# Patient Record
Sex: Female | Born: 1945
Health system: Southern US, Community
[De-identification: ages and names within clinical notes are randomized; demographics above are authoritative.]

## PROBLEM LIST (undated history)

## (undated) DIAGNOSIS — Z8041 Family history of malignant neoplasm of ovary: Secondary | ICD-10-CM

## (undated) DIAGNOSIS — F419 Anxiety disorder, unspecified: Secondary | ICD-10-CM

## (undated) DIAGNOSIS — Z923 Personal history of irradiation: Secondary | ICD-10-CM

## (undated) DIAGNOSIS — I10 Essential (primary) hypertension: Secondary | ICD-10-CM

## (undated) DIAGNOSIS — C801 Malignant (primary) neoplasm, unspecified: Secondary | ICD-10-CM

## (undated) DIAGNOSIS — I4891 Unspecified atrial fibrillation: Secondary | ICD-10-CM

## (undated) DIAGNOSIS — Z800 Family history of malignant neoplasm of digestive organs: Secondary | ICD-10-CM

## (undated) DIAGNOSIS — Z803 Family history of malignant neoplasm of breast: Secondary | ICD-10-CM

## (undated) DIAGNOSIS — E7800 Pure hypercholesterolemia, unspecified: Secondary | ICD-10-CM

## (undated) DIAGNOSIS — K219 Gastro-esophageal reflux disease without esophagitis: Secondary | ICD-10-CM

## (undated) HISTORY — PX: TUBAL LIGATION: SHX77

## (undated) HISTORY — DX: Family history of malignant neoplasm of ovary: Z80.41

## (undated) HISTORY — DX: Family history of malignant neoplasm of breast: Z80.3

## (undated) HISTORY — DX: Family history of malignant neoplasm of digestive organs: Z80.0

## (undated) HISTORY — PX: CARDIAC CATHETERIZATION: SHX172

---

## 2006-12-09 ENCOUNTER — Ambulatory Visit: Payer: Self-pay | Admitting: Cardiology

## 2006-12-25 ENCOUNTER — Ambulatory Visit: Payer: Self-pay | Admitting: Cardiology

## 2006-12-31 ENCOUNTER — Ambulatory Visit: Payer: Self-pay | Admitting: Cardiovascular Disease

## 2006-12-31 ENCOUNTER — Inpatient Hospital Stay (HOSPITAL_BASED_OUTPATIENT_CLINIC_OR_DEPARTMENT_OTHER): Admission: RE | Admit: 2006-12-31 | Discharge: 2006-12-31 | Payer: Self-pay | Admitting: Cardiovascular Disease

## 2010-11-21 NOTE — Assessment & Plan Note (Signed)
[facility]                          [facility] NOTE   NAME:[name], Marissa HALLS           MRN:          [id number]  DATE:12/25/2006                            DOB:          10/12/1945    HISTORY OF PRESENT ILLNESS:  The patient is a 65 year old female  recently admitted to [facility] with elevated blood pressure,  headache, and back discomfort. The patient had an extensive evaluation.  She was found to have a slight inequality in blood pressures between  both arms. She underwent a CT scan of the chest to rule out erratic  dissection which was negative. She was also noted to have no evidence of  pulmonary embolism. The patient reported that the back pain, which was  felt to be rather atypical,   INCOMPLETE     Learta Codding, MD,FACC  Electronically Signed    GED/MedQ  DD: 12/25/2006  DT: 12/26/2006  Job #: [id number]   cc:   Kirstie Peri, MD

## 2010-11-21 NOTE — Cardiovascular Report (Signed)
Marissa [name], Marissa [name]              ACCOUNT NO.:  [id number]   MEDICAL RECORD NO.:  [id number]          PATIENT TYPE:  OIB   LOCATION:  1962                         FACILITY:  [facility]   PHYSICIAN:  Veverly Fells. Excell Seltzer, MD  DATE OF BIRTH:  June 03, 1946   DATE OF PROCEDURE:  12/31/2006  DATE OF DISCHARGE:                            CARDIAC CATHETERIZATION   PROCEDURE:  Left heart catheterization, right heart catheterization,  selective coronary angiography, left ventricular angiography.   INDICATIONS:  [name] is a 65 year old woman who recently presented to  the hospital at [facility] with shortness of breath and back pain.  She  has undergone extensive cardiac evaluation.  She had a stress nuclear  study that did not show definitive ischemia but raised the question of  balanced ischemia with some ventricular dilatation.  She complains of  marked exertional dyspnea and was referred for right and left heart  catheterization in that setting.   Risks and indications of procedure were explained to the patient.  Informed consent was obtained.  Right groin was prepped, draped and  anesthetized with 1% lidocaine.  Using modified Seldinger technique a 4-  French sheath was placed in the right femoral artery and a 7-French  sheath was placed in the right femoral vein.  Using an end-hole  multipurpose catheter, the right heart catheterization was performed.  Oxygen saturations were drawn from the superior vena cava, pulmonary  artery and aorta.  Following the right heart catheterization, selective  coronary angiography was performed using standard 4-French catheters.  An angled pigtail catheter was inserted into the left ventricle and  pressures were recorded.  An RAO left ventriculogram was performed and a  pullback across the aortic valve was done.  All catheter exchanges were  performed over a guidewire.  There were no immediate complications.   FINDINGS:  Right atrial pressures:  A-wave 10,  V-wave 5, mean of 4,  right ventricular pressures 30/5, pulmonary artery pressures 30/8 with a  mean of 18.  Pulmonary capillary wedge pressure:  A-wave 16, V-wave 17,  mean of 11, left ventricular pressure 109/11, aortic pressure is 108/58  with a mean of 80.   Oxygen saturations:  SVC 62, pulmonary artery 67, aorta 94.   Cardiac output by the Fick techniques 4 liters per minute.  Cardiac  index 1.81 liters per minute per meter squared.   CORONARY ANGIOGRAPHY:  Left mainstem is angiographically normal  bifurcates into the LAD and left circumflex.   The LAD is large-caliber vessel that courses down to the LV apex.  There  is minor nonobstructive narrowing at the ostium of the LAD.  No worse  than 20%.  There is a large first diagonal branch that is free of any  significant angiographic disease.  The remaining portions of the  proximal mid and distal LAD are angiographically normal.   The left circumflex is large-caliber vessel.  It courses down and  supplies a very small first OM branch followed by a large second OM  branch.  There are then two medium-sized posterolateral branches.  There  is no significant angiographic disease in the  left circumflex system.   The right coronary artery is a medium size vessel.  It gives off one  small RV marginal branch.  It terminates in a PDA branch, and it is a  dominant right coronary artery.  There is no significant angiographic  disease in the right coronary artery.   Left ventricular function assessed by 30 degrees RAO left  ventriculography shows normal contractility of all segments.  The  estimated LVEF is 60%.  There is no mitral regurgitation.   ASSESSMENT:  1. Nonobstructive coronary artery disease involving the proximal LAD.      There is no angiographic disease in the left circumflex or right      coronary artery.  2. Normal left ventricular function.  3. Normal right heart and left heart hemodynamics.   PLAN:  Recommend  continued medical therapy.  I suspect a noncardiac  etiology of the patient's dyspnea based on her normal filling pressures  and unremarkable coronary arteries.      Veverly Fells. Excell Seltzer, MD  Electronically Signed     MDC/MEDQ  D:  12/31/2006  T:  12/31/2006  Job:  [id number]   cc:   Learta Codding, MD,FACC  Kirstie Peri, MD

## 2010-11-21 NOTE — Assessment & Plan Note (Signed)
[facility]                          [facility] NOTE   NAME:Marissa [name], Marissa [name]           MRN:          [id number]  DATE:12/25/2006                            DOB:          31-May-1946    HISTORY OF PRESENT ILLNESS:  The patient is a 65 year old female who was  recently admitted to [facility] with headache, elevated blood pressure,  and back discomfort. The patient was ruled out for aortic dissection  with a CT scan. She also had a Cardiolite imaging study done which  showed no definite perfusion defects. However, it was increased TID  ratio and mention was made that __________ ischemia could not be ruled  out. The patient states that she continues to have discomfort. She  points towards her upper back which appears to be more related to  vertebral pathology. She also reports a discomfort when swallowing. Her  most concerning complaint, however, is ongoing symptoms of dyspnea. She  states that she becomes dyspneic on minimal exertion. However, she  denies any substernal chest pain. Of note is that the patient is found  to have a carotid bruit. During her last evaluation, carotid Dopplers  were essentially within normal limits. The patient remains very  concerned and states that some of the symptoms are similar to what her  mother had before she presented with a myocardial infarction.   MEDICATIONS:  1. Prilosec OTC.  2. Hydrochlorothiazide 25 mg daily.  3. Aspirin 81 mg daily.  4. Exforge 10/20 mg daily.   PHYSICAL EXAMINATION:  VITAL SIGNS: Blood pressure 132/76, heart rate 77  beats-per-minute, weight 257 pounds.  NECK:  Normal carotid upstrokes with a left sided carotid bruit.  HEART:  Regular rate and rhythm with normal S1, S2, with no murmurs,  rubs, or gallops.  LUNGS:  Clear breath sounds bilaterally.  ABDOMEN:  Soft and nontender with no rebound or guarding, good bowel  sounds.  EXTREMITIES:  No cyanosis, clubbing, or  edema.   PROBLEM LIST:  1. Back pain.  2. Dyspnea.  3. Normal Cardiolite stress study, but with abnormal TID ratio.  4. Throat discomfort.   PLAN:  1. The patient remains concerned about her dyspnea. She does have a      family history of coronary artery disease. During her last visit to      the hospital, we stated that if she had ongoing problems, we would      proceed with a left and right catheterization. The patient is now      willing to proceed with this.  2. If the above study is negative, the patient will need an evaluation      for possible swallowing dysfunction and throat tightness. She      should have a referral to GI.  3. I suspect that the patient's back discomfort is more related to a      spinal pathology and can be further handled by her primary care      physician.     Learta Codding, MD,FACC  Electronically Signed    GED/MedQ  DD: 12/25/2006  DT: 12/26/2006  Job #: [id number]   cc:  Kirstie Peri, MD

## 2010-11-24 NOTE — Assessment & Plan Note (Signed)
[facility]                                 ON-CALL NOTE   Marissa [name], Marissa [name]                       MRN:          [id number]  DATE:09/16/2008                            DOB:          [birth year]    PRIMARY CARDIOLOGIST:  Dr. Vernie Shanks. DeGent.   PRIMARY CARE [place] Baris:  Kirstie Peri, MD   I received a call from [name] this evening, September 16, 2008, reporting  that her blood pressure was high earlier this week and that she saw Dr.  Sherryll Burger who recommended that she increase her atenolol to 50 mg daily.  She  has had some weakness and was seen in the emergency room at [facility] 2  days ago and apparently was told she had reflux.  This evening, she is  due to take her 50 mg of atenolol and her blood pressure is 169/69 and  she is concerned that if the bottom number gets too much lower, she may  pass out.  I reassured her that her pressure if anything is high right  now, that she go ahead and take her atenolol to help bring down her  systolic number.  She was grateful for the reassurance and the call back  and was going to take her atenolol tonight.     Nicolasa Ducking, ANP  Electronically Signed    CB/MedQ  DD: 09/16/2008  DT: 09/17/2008  Job #: [id number]

## 2011-04-25 LAB — POCT I-STAT 3, VENOUS BLOOD GAS (G3P V)
O2 Saturation: 67
Operator id: 194801
pCO2, Ven: 42.3 — ABNORMAL LOW
pH, Ven: 7.364 — ABNORMAL HIGH

## 2011-04-25 LAB — POCT I-STAT 3, ART BLOOD GAS (G3+)
Bicarbonate: 24
O2 Saturation: 94
pO2, Arterial: 70 — ABNORMAL LOW

## 2014-02-15 ENCOUNTER — Observation Stay (HOSPITAL_COMMUNITY)
Admission: EM | Admit: 2014-02-15 | Discharge: 2014-02-18 | Disposition: A | Discharge status: Home / Self Care | Payer: Medicare Other | Attending: Internal Medicine | Admitting: Internal Medicine

## 2014-02-15 ENCOUNTER — Emergency Department (HOSPITAL_COMMUNITY): Payer: Medicare Other

## 2014-02-15 ENCOUNTER — Encounter (HOSPITAL_COMMUNITY): Payer: Self-pay | Admitting: Emergency Medicine

## 2014-02-15 DIAGNOSIS — R072 Precordial pain: Secondary | ICD-10-CM | POA: Diagnosis not present

## 2014-02-15 DIAGNOSIS — Z862 Personal history of diseases of the blood and blood-forming organs and certain disorders involving the immune mechanism: Secondary | ICD-10-CM | POA: Diagnosis not present

## 2014-02-15 DIAGNOSIS — Z79899 Other long term (current) drug therapy: Secondary | ICD-10-CM | POA: Diagnosis not present

## 2014-02-15 DIAGNOSIS — E669 Obesity, unspecified: Secondary | ICD-10-CM | POA: Diagnosis not present

## 2014-02-15 DIAGNOSIS — R079 Chest pain, unspecified: Secondary | ICD-10-CM | POA: Diagnosis present

## 2014-02-15 DIAGNOSIS — I10 Essential (primary) hypertension: Secondary | ICD-10-CM | POA: Diagnosis not present

## 2014-02-15 DIAGNOSIS — R21 Rash and other nonspecific skin eruption: Secondary | ICD-10-CM | POA: Insufficient documentation

## 2014-02-15 DIAGNOSIS — K209 Esophagitis, unspecified without bleeding: Secondary | ICD-10-CM

## 2014-02-15 DIAGNOSIS — Z7982 Long term (current) use of aspirin: Secondary | ICD-10-CM | POA: Diagnosis not present

## 2014-02-15 DIAGNOSIS — Z8639 Personal history of other endocrine, nutritional and metabolic disease: Secondary | ICD-10-CM | POA: Insufficient documentation

## 2014-02-15 HISTORY — DX: Essential (primary) hypertension: I10

## 2014-02-15 HISTORY — DX: Pure hypercholesterolemia, unspecified: E78.00

## 2014-02-15 LAB — CBC WITH DIFFERENTIAL/PLATELET
BASOS ABS: 0 10*3/uL (ref 0.0–0.1)
BASOS PCT: 0 % (ref 0–1)
EOS ABS: 0.1 10*3/uL (ref 0.0–0.7)
EOS PCT: 1 % (ref 0–5)
HCT: 44.8 % (ref 36.0–46.0)
Hemoglobin: 15.5 g/dL — ABNORMAL HIGH (ref 12.0–15.0)
LYMPHS ABS: 2.6 10*3/uL (ref 0.7–4.0)
Lymphocytes Relative: 28 % (ref 12–46)
MCH: 31.3 pg (ref 26.0–34.0)
MCHC: 34.6 g/dL (ref 30.0–36.0)
MCV: 90.3 fL (ref 78.0–100.0)
Monocytes Absolute: 0.6 10*3/uL (ref 0.1–1.0)
Monocytes Relative: 7 % (ref 3–12)
NEUTROS PCT: 64 % (ref 43–77)
Neutro Abs: 5.8 10*3/uL (ref 1.7–7.7)
PLATELETS: 334 10*3/uL (ref 150–400)
RBC: 4.96 MIL/uL (ref 3.87–5.11)
RDW: 12.2 % (ref 11.5–15.5)
WBC: 9.1 10*3/uL (ref 4.0–10.5)

## 2014-02-15 LAB — BASIC METABOLIC PANEL
Anion gap: 16 — ABNORMAL HIGH (ref 5–15)
BUN: 25 mg/dL — ABNORMAL HIGH (ref 6–23)
CALCIUM: 10.5 mg/dL (ref 8.4–10.5)
CO2: 22 mEq/L (ref 19–32)
Chloride: 97 mEq/L (ref 96–112)
Creatinine, Ser: 1.34 mg/dL — ABNORMAL HIGH (ref 0.50–1.10)
GFR, EST AFRICAN AMERICAN: 46 mL/min — AB (ref >90)
GFR, EST NON AFRICAN AMERICAN: 40 mL/min — AB (ref >90)
GLUCOSE: 104 mg/dL — AB (ref 70–99)
Potassium: 3.6 mEq/L — ABNORMAL LOW (ref 3.7–5.3)
SODIUM: 135 meq/L — AB (ref 137–147)

## 2014-02-15 LAB — TROPONIN I: Troponin I: <0.3 ng/mL (ref <0.30)

## 2014-02-15 LAB — D-DIMER, QUANTITATIVE: D-Dimer, Quant: 0.31 ug/mL-FEU (ref 0.00–0.48)

## 2014-02-15 MED ORDER — AMLODIPINE BESYLATE 5 MG PO TABS
10.0000 mg | ORAL_TABLET | Freq: Every day | ORAL | Status: DC
  Administered 2014-02-16 – 2014-02-18 (×3): 10 mg via ORAL
  Filled 2014-02-15 (×3): qty 2

## 2014-02-15 MED ORDER — METOPROLOL SUCCINATE ER 25 MG PO TB24
25.0000 mg | ORAL_TABLET | Freq: Every day | ORAL | Status: DC
  Filled 2014-02-15: qty 1

## 2014-02-15 MED ORDER — LOSARTAN POTASSIUM 50 MG PO TABS
100.0000 mg | ORAL_TABLET | Freq: Every day | ORAL | Status: DC
  Administered 2014-02-16 – 2014-02-18 (×3): 100 mg via ORAL
  Filled 2014-02-15 (×3): qty 2

## 2014-02-15 MED ORDER — HYDROCHLOROTHIAZIDE 25 MG PO TABS
25.0000 mg | ORAL_TABLET | Freq: Every day | ORAL | Status: DC
  Filled 2014-02-15: qty 1

## 2014-02-15 MED ORDER — ASPIRIN EC 81 MG PO TBEC
81.0000 mg | DELAYED_RELEASE_TABLET | Freq: Every day | ORAL | Status: DC
  Administered 2014-02-16 – 2014-02-18 (×3): 81 mg via ORAL
  Filled 2014-02-15 (×3): qty 1

## 2014-02-15 MED ORDER — LOSARTAN POTASSIUM-HCTZ 100-25 MG PO TABS: 1.0000 | ORAL_TABLET | Freq: Every day | ORAL | Status: DC

## 2014-02-15 MED ORDER — SODIUM CHLORIDE 0.9 % IV SOLN
INTRAVENOUS | Status: DC
  Administered 2014-02-15 – 2014-02-18 (×8): via INTRAVENOUS

## 2014-02-15 MED ORDER — NITROGLYCERIN 0.4 MG SL SUBL
0.4000 mg | SUBLINGUAL_TABLET | Freq: Once | SUBLINGUAL | Status: AC
  Administered 2014-02-15: 0.4 mg via SUBLINGUAL
  Filled 2014-02-15: qty 1

## 2014-02-15 MED ORDER — GI COCKTAIL ~~LOC~~
30.0000 mL | Freq: Two times a day (BID) | ORAL | Status: DC | PRN
  Administered 2014-02-15: 30 mL via ORAL
  Filled 2014-02-15: qty 30

## 2014-02-15 MED ORDER — ONDANSETRON HCL 4 MG/2ML IJ SOLN: 4.0000 mg | Freq: Four times a day (QID) | INTRAMUSCULAR | Status: DC | PRN

## 2014-02-15 MED ORDER — ONDANSETRON HCL 4 MG PO TABS: 4.0000 mg | ORAL_TABLET | Freq: Four times a day (QID) | ORAL | Status: DC | PRN

## 2014-02-15 MED ORDER — ASPIRIN 325 MG PO TABS
325.0000 mg | ORAL_TABLET | Freq: Once | ORAL | Status: AC
  Administered 2014-02-15: 325 mg via ORAL
  Filled 2014-02-15: qty 1

## 2014-02-15 MED ORDER — HEPARIN SODIUM (PORCINE) 5000 UNIT/ML IJ SOLN
5000.0000 [IU] | Freq: Three times a day (TID) | INTRAMUSCULAR | Status: DC
  Administered 2014-02-15 – 2014-02-18 (×8): 5000 [IU] via SUBCUTANEOUS
  Filled 2014-02-15 (×8): qty 1

## 2014-02-15 MED ORDER — SODIUM CHLORIDE 0.9 % IJ SOLN
3.0000 mL | Freq: Two times a day (BID) | INTRAMUSCULAR | Status: DC
  Administered 2014-02-16 – 2014-02-17 (×2): 3 mL via INTRAVENOUS

## 2014-02-15 MED ORDER — SULFAMETHOXAZOLE-TMP DS 800-160 MG PO TABS: 1.0000 | ORAL_TABLET | Freq: Two times a day (BID) | ORAL | Status: DC

## 2014-02-15 NOTE — H&P (Signed)
[facility] History and Physical  Marissa [name] OBS:[id number] DOB: 08-Jun-1946 DOA: 02/15/2014  Referring physician: ER PCP: No primary provider on file.   Chief Complaint: Chest pain  HPI: Marissa [name] is a 68 y.o. female  This is a 68 year old lady who gives a almost 24 hour history of almost continuous chest pain that is from the lower end of the anterior chest to the neck. She says it feels like she has a dull pain and heaviness in her chest. It is not associated with nausea, excessive sweating but was associated with dyspnea. She does have a history of cardiac catheterization several years ago which showed essentially normal coronary arteries. She is now being there for further investigation.   Review of Systems:  Constitutional:  No weight loss, night sweats, Fevers, chills, fatigue.  HEENT:  No headaches, Difficulty swallowing,Tooth/dental problems,Sore throat,  No sneezing, itching, ear ache, nasal congestion, post nasal drip,   GI:  No heartburn, indigestion, abdominal pain, nausea, vomiting, diarrhea, change in bowel habits, loss of appetite  Resp:  No shortness of breath with exertion or at rest. No excess mucus, no productive cough, No non-productive cough, No coughing up of blood.No change in color of mucus.No wheezing.No chest wall deformity  Skin:  no rash or lesions.  GU:  no dysuria, change in color of urine, no urgency or frequency. No flank pain.  Musculoskeletal:  No joint pain or swelling. No decreased range of motion. No back pain.  Psych:  No change in mood or affect. No depression or anxiety. No memory loss.   Past Medical History  Diagnosis Date  . Hypertension   . High cholesterol    Past Surgical History  Procedure Laterality Date  . Tubal ligation     Social History:  reports that she has never smoked. She does not have any smokeless tobacco history on file. She reports that she does not drink alcohol or use illicit  drugs.  Allergies  Allergen Reactions  . Nitrostat [Nitroglycerin] Shortness Of Breath    History reviewed. No pertinent family history.   Prior to Admission medications   Medication Sig Start Date End Date Taking? Authorizing Provider  amLODipine (NORVASC) 10 MG tablet Take 10 mg by mouth daily.   Yes Historical Provider, MD  aspirin EC 81 MG tablet Take 81 mg by mouth daily.   Yes Historical Provider, MD  losartan-hydrochlorothiazide (HYZAAR) 100-25 MG per tablet Take 1 tablet by mouth daily.   Yes Historical Provider, MD  metoprolol succinate (TOPROL-XL) 25 MG 24 hr tablet Take 25 mg by mouth daily.   Yes Historical Provider, MD  sulfamethoxazole-trimethoprim (BACTRIM DS) 800-160 MG per tablet Take 1 tablet by mouth 2 (two) times daily. Starting 02/08/2014 x 10 days.   Yes Historical Provider, MD   Physical Exam: Filed Vitals:   02/15/14 1454 02/15/14 1502 02/15/14 1556 02/15/14 1643  BP: 129/56 98/49 114/59 139/49  Pulse: 84 113 68 63  Temp:    98.2 F (36.8 C)  TempSrc:    Oral  Resp: 17 37 16   Height:    5\' 4"  (1.626 m)  Weight:    102.3 kg (225 lb 8.5 oz)  SpO2: 100% 99% 100% 100%    Wt Readings from Last 3 Encounters:  02/15/14 102.3 kg (225 lb 8.5 oz)    General:  Appears calm and comfortable Eyes: PERRL, normal lids, irises & conjunctiva ENT: grossly normal hearing, lips & tongue Neck: no LAD, masses or thyromegaly Cardiovascular:  RRR, no m/r/g. No LE edema. Telemetry: SR, no arrhythmias  Respiratory: CTA bilaterally, no w/r/r. Normal respiratory effort. Abdomen: soft, ntnd Skin: no rash or induration seen on limited exam Musculoskeletal: grossly normal tone BUE/BLE. She appears to have tenderness in the anterior chest wall more on the left than the right, reproducing her pain. Psychiatric: grossly normal mood and affect, speech fluent and appropriate Neurologic: grossly non-focal.          Labs on Admission:  Basic Metabolic Panel:  Recent Labs Lab  02/15/14 1351  NA 135*  K 3.6*  CL 97  CO2 22  GLUCOSE 104*  BUN 25*  CREATININE 1.34*  CALCIUM 10.5       CBC:  Recent Labs Lab 02/15/14 1351  WBC 9.1  NEUTROABS 5.8  HGB 15.5*  HCT 44.8  MCV 90.3  PLT 334   Cardiac Enzymes:  Recent Labs Lab 02/15/14 1351  TROPONINI <0.30    BNP (last 3 results) No results found for this basename: PROBNP,  in the last 8760 hours CBG: No results found for this basename: GLUCAP,  in the last 168 hours  Radiological Exams on Admission: Dg Chest Portable 1 View  02/15/2014   CLINICAL DATA:  Chest pain.  EXAM: PORTABLE CHEST - 1 VIEW  COMPARISON:  09/14/2008  FINDINGS: Heart size and pulmonary vascularity are normal and the lungs are clear. No effusions. No osseous abnormality.  IMPRESSION: Normal chest.   Electronically Signed   By: Rozetta Nunnery M.D.   On: 02/15/2014 14:03    EKG: Independently reviewed. Normal sinus rhythm without any acute ST-T wave changes.  Assessment/Plan   1. Chest pain, atypical, possibly gastrointestinal in origin. Does have risk factors for coronary artery disease. 2. Hypertension. 3. Renal insufficiency, possibly related to dehydration. 4. Morbid obesity.  Plan: 1. Admit to telemetry floor. 2. Serial cardiac enzymes. 3. Appreciate cardiology consultation. 4.  Gastroenterology consultation. Will try GI cocktail to see if this will help. 5. Intravenous fluids for rehydration.  Other recommendations will depend on patient's hospital progress.   Code Status: Full code.   DVT Prophylaxis: Heparin.  Family Communication: I discussed the plan with patient at the bedside.   Disposition Plan: Home when medically stable.  Time spent: 60 minutes.  Doree Albee [facility] Pager [phone].  **Disclaimer: This note may have been dictated with voice recognition software. Similar sounding words can inadvertently be transcribed and this note may contain transcription errors which may not  have been corrected upon publication of note.**

## 2014-02-15 NOTE — ED Notes (Signed)
After Nitro given pt then voices that " I feel like Im having trouble breathing." resp rate in 30's, O2 placed on 2L. sats 99%. Dr. Lacinda Axon at [facility] to assess pt.

## 2014-02-15 NOTE — Consult Note (Signed)
Consulting cardiologist: Dr Carlyle Dolly MD  Clinical Summary Marissa [name] is a 68 y.o.female seen today in ER for chest pain.   From available notes seen in 2008 by Dr Dannielle Burn for somewhat atypical chest pain, primarily upper back pain as well as some dysphagia. She had some progressive dyspnea around that time. Had cardiolite at that time with no perfusion defects by elevated TID ratio. Given progressing symptoms and evidence of TID was referred for left and right cath in 2008.  LHC LM normal, LAD 20%, LCX normal, RCA patent. LVEF 60% by LV gram RHC RA 4, PA 30/8 mean 18, PWCP 11.  Overall patent coronaries with normal filling pressures.    Presents to day with chest pain. Symptoms started last night around 7pm while at rest. 9/10 pressing feeling from upper epigastic area running up her mid chest into her throat. No SOB, no palpitations. No positional. She has also had constant belching since onset of the pain. Drank a coke, took tums without relief. Took NG in ER with hypotensive response, did not affect her pain.   ER vitals p 141/77 p 96 100% RA Trop neg x1, K 3.6, Cr 1.34, BUN 25, GFR 40, D-dimer pending. CXR no acute pathology EKG sinus rhythm, occasional PVCs, no ischemic changes.     No Known Allergies  Medications Scheduled Medications:    Infusions:    PRN Medications:        Past Medical History  Diagnosis Date  . Hypertension   . High cholesterol     Past Surgical History  Procedure Laterality Date  . Tubal ligation      History reviewed. No pertinent family history.  Social History Ms. Turley reports that she has never smoked. She does not have any smokeless tobacco history on file. Ms. Thorman reports that she does not drink alcohol.  Review of Systems CONSTITUTIONAL: No weight loss, fever, chills, weakness or fatigue.  HEENT: Eyes: No visual loss, blurred vision, double vision or yellow sclerae. No hearing loss, sneezing, congestion, runny  nose or sore throat.  SKIN: No rash or itching.  CARDIOVASCULAR: per HPI RESPIRATORY: No shortness of breath, cough or sputum.  GASTROINTESTINAL: No anorexia, nausea, vomiting or diarrhea. No abdominal pain or blood.  GENITOURINARY: no polyuria, no dysuria NEUROLOGICAL: No headache, dizziness, syncope, paralysis, ataxia, numbness or tingling in the extremities. No change in bowel or bladder control.  MUSCULOSKELETAL: No muscle, back pain, joint pain or stiffness.  HEMATOLOGIC: No anemia, bleeding or bruising.  LYMPHATICS: No enlarged nodes. No history of splenectomy.  PSYCHIATRIC: No history of depression or anxiety.      Physical Examination Blood pressure 98/49, pulse 113, temperature 98 F (36.7 C), temperature source Oral, resp. rate 37, height 5\' 4"  (1.626 m), weight 230 lb (104.327 kg), SpO2 99.00%. No intake or output data in the 24 hours ending 02/15/14 1517  HEENT: sclera clear  Cardiovascular: RRR, no m/r/g, no JVD, no carotid bruits  Respiratory: CTAB  GI: abdomen soft, NT, ND  MSK: chest wall very tender to palpation  Neuro: A&O x3 no focal deficits  Psych: appropriate affect   Lab Results  Basic Metabolic Panel:  Recent Labs Lab 02/15/14 1351  NA 135*  K 3.6*  CL 97  CO2 22  GLUCOSE 104*  BUN 25*  CREATININE 1.34*  CALCIUM 10.5    Liver Function Tests: No results found for this basename: AST, ALT, ALKPHOS, BILITOT, PROT, ALBUMIN,  in the last 168 hours  CBC:  Recent Labs Lab 02/15/14 1351  WBC 9.1  NEUTROABS 5.8  HGB 15.5*  HCT 44.8  MCV 90.3  PLT 334    Cardiac Enzymes:  Recent Labs Lab 02/15/14 1351  TROPONINI <0.30    BNP: No components found with this basename: POCBNP,    ECG   Imaging 12/2006 Cath FINDINGS: Right atrial pressures: A-wave 10, V-wave 5, mean of 4,  right ventricular pressures 30/5, pulmonary artery pressures 30/8 with a  mean of 18. Pulmonary capillary wedge pressure: A-wave 16, V-wave 17,  mean of  11, left ventricular pressure 109/11, aortic pressure is 108/58  with a mean of 80.  Oxygen saturations: SVC 62, pulmonary artery 67, aorta 94.  Cardiac output by the Fick techniques 4 liters per minute. Cardiac  index 1.81 liters per minute per meter squared.  CORONARY ANGIOGRAPHY: Left mainstem is angiographically normal  bifurcates into the LAD and left circumflex.  The LAD is large-caliber vessel that courses down to the LV apex. There  is minor nonobstructive narrowing at the ostium of the LAD. No worse  than 20%. There is a large first diagonal Parvin Stetzer that is free of any  significant angiographic disease. The remaining portions of the  proximal mid and distal LAD are angiographically normal.  The left circumflex is large-caliber vessel. It courses down and  supplies a very small first OM Ziair Penson followed by a large second OM  Dominie Benedick. There are then two medium-sized posterolateral branches. There  is no significant angiographic disease in the left circumflex system.  The right coronary artery is a medium size vessel. It gives off one  small RV marginal Sofia Jaquith. It terminates in a PDA Aramis Zobel, and it is a  dominant right coronary artery. There is no significant angiographic  disease in the right coronary artery.  Left ventricular function assessed by 30 degrees RAO left  ventriculography shows normal contractility of all segments. The  estimated LVEF is 60%. There is no mitral regurgitation.  ASSESSMENT:  1. Nonobstructive coronary artery disease involving the proximal LAD.  There is no angiographic disease in the left circumflex or right  coronary artery.  2. Normal left ventricular function.  3. Normal right heart and left heart hemodynamics.  PLAN: Recommend continued medical therapy. I suspect a noncardiac  etiology of the patient's dyspnea based on her normal filling pressures  and unremarkable coronary arteries.   Impression/Recommendations  1. Chest pain - atypical for  cardiac chest pain given continous non-stop pain since 7pm last night (21 hrs). Its also associated with persistent belching, and runs from upper epigastrium to her throat. Her chest wall is very tender to palpation as well - EKG without ischemic changes, she does have occasional PVCs. Troponin negative, would think if ongoing intermittent ischemia this long trop would also be positive.  - admit to cycle cardiac enzyme and EKGs, plan for echo in AM. No plan at this time for stress testing - consider GI evaluation - pain control per medicine team, perhaps consider GI cocktail, PPI or H2 blocker. Avoid prn NG due to low bp response, I suspect she may be dry based on her labs causing the bp drop with NG. Chest pain with NG induced hypotension always brings to mind possible RV infarct, but her EKG and clinical presentation does not suggest this. Her negative D-dimer and lack of tachycardia or hypoxia also do not support PE, which can also cause NG induced hypotension.      Carlyle Dolly, M.D., F.A.C.C.

## 2014-02-15 NOTE — ED Provider Notes (Signed)
CSN: [id number]     Arrival date & time 02/15/14  1328 History  This chart was scribed for Nat Christen, MD by Ludger Nutting, ED Scribe. This patient was seen in room APA12/APA12 and the patient's care was started 2:20 PM.    Chief Complaint  Patient presents with  . Chest Pain    The history is provided by the patient. No language interpreter was used.    HPI Comments: Marissa [name] is a 68 y.o. female with past medical history of HTN, high cholesterol who presents to the Emergency Department complaining of constant, substernal chest pain that began at 7 PM last night. She states the pain is non-radiating and describes it as pressure. She reports associated SOB with activity. She drank a coke and took tums without relief. Patient states she had a cardiac catheterization 8 years ago at [facility] which showed a 25% blockage. She denies diaphoresis, nausea, leg swelling. She takes daily ASA. She is a nonsmoker and denies alcohol use. Mother had history of cardiac disease and cardiac stents in her 52's.   Patient reports having 2-3 episodes of a rash over the last 3 weeks. Patient states she diagnosed with a "vascular infection" to the bilateral legs the first time and was prescribed steroids and antibiotics. She states the second time she had a rash to the bilateral shoulders and chest for which she was prescribed steroids and antibiotics with relief. She reports the most recent episode was a few days ago and she finished a course of prednisone yesterday.   PCP [facility]    Past Medical History  Diagnosis Date  . Hypertension   . High cholesterol    Past Surgical History  Procedure Laterality Date  . Tubal ligation     History reviewed. No pertinent family history. History  Substance Use Topics  . Smoking status: Never Smoker   . Smokeless tobacco: Not on file  . Alcohol Use: No   OB History   Grav Para Term Preterm Abortions TAB SAB Ect Mult Living                 Review  of Systems  A complete 10 system review of systems was obtained and all systems are negative except as noted in the HPI and PMH.    Allergies  Review of patient's allergies indicates no known allergies.  Home Medications   Prior to Admission medications   Medication Sig Start Date End Date Taking? Authorizing Provider  amLODipine (NORVASC) 10 MG tablet Take 10 mg by mouth daily.   Yes Historical Provider, MD  aspirin EC 81 MG tablet Take 81 mg by mouth daily.   Yes Historical Provider, MD  losartan-hydrochlorothiazide (HYZAAR) 100-25 MG per tablet Take 1 tablet by mouth daily.   Yes Historical Provider, MD  metoprolol succinate (TOPROL-XL) 25 MG 24 hr tablet Take 25 mg by mouth daily.   Yes Historical Provider, MD  sulfamethoxazole-trimethoprim (BACTRIM DS) 800-160 MG per tablet Take 1 tablet by mouth 2 (two) times daily. Starting 02/08/2014 x 10 days.   Yes Historical Provider, MD   BP 98/49  Pulse 113  Temp(Src) 98 F (36.7 C) (Oral)  Resp 37  Ht 5\' 4"  (1.626 m)  Wt 230 lb (104.327 kg)  BMI 39.46 kg/m2  SpO2 99% Physical Exam  Nursing note and vitals reviewed. Constitutional: She is oriented to person, place, and time. She appears well-developed and well-nourished.  Obese   HENT:  Head: Normocephalic and atraumatic.  Eyes: Conjunctivae and EOM are normal. Pupils are equal, round, and reactive to light.  Neck: Normal range of motion. Neck supple.  Cardiovascular: Normal rate, regular rhythm and normal heart sounds.   Pulmonary/Chest: Effort normal and breath sounds normal.  Abdominal: Soft. Bowel sounds are normal.  Musculoskeletal: Normal range of motion.  Neurological: She is alert and oriented to person, place, and time.  Skin: Skin is warm and dry. Rash noted. Rash is papular. There is erythema.  Diffuse, erythematous, papular, plaque-like rash  Psychiatric: She has a normal mood and affect. Her behavior is normal.    ED Course  Procedures (including critical care  time)  DIAGNOSTIC STUDIES: Oxygen Saturation is 100% on RA, normal by my interpretation.    COORDINATION OF CARE: 2:25 PM Will order CXR, lab work, EKG. Will give ASA and nitro SL tablet. Discussed treatment plan with pt at bedside and pt agreed to plan.   Labs Review Labs Reviewed  CBC WITH DIFFERENTIAL - Abnormal; Notable for the following:    Hemoglobin 15.5 (*)    All other components within normal limits  BASIC METABOLIC PANEL - Abnormal; Notable for the following:    Sodium 135 (*)    Potassium 3.6 (*)    Glucose, Bld 104 (*)    BUN 25 (*)    Creatinine, Ser 1.34 (*)    GFR calc non Af Amer 40 (*)    GFR calc Af Amer 46 (*)    Anion gap 16 (*)    All other components within normal limits  TROPONIN I  D-DIMER, QUANTITATIVE    Imaging Review Dg Chest Portable 1 View  02/15/2014   CLINICAL DATA:  Chest pain.  EXAM: PORTABLE CHEST - 1 VIEW  COMPARISON:  09/14/2008  FINDINGS: Heart size and pulmonary vascularity are normal and the lungs are clear. No effusions. No osseous abnormality.  IMPRESSION: Normal chest.   Electronically Signed   By: Rozetta Nunnery M.D.   On: 02/15/2014 14:03     EKG Interpretation   Date/Time:  Monday February 15 2014 13:43:28 EDT Ventricular Rate:  95 PR Interval:  132 QRS Duration: 98 QT Interval:  356 QTC Calculation: 447 R Axis:   33 Text Interpretation:  Sinus rhythm Multiple ventricular premature  complexes Consider anterior infarct Confirmed by Hani Patnode  MD, Tyjai Charbonnet ([id number])  on 02/15/2014 3:11:12 PM      MDM   Final diagnoses:  Chest pain, unspecified chest pain type    Good history for anginal chest pain. Patient has minimal to moderate risk factor profile.  EKG shows PVCs but no ST segment changes.  Patient did not tolerate nitroglycerin sublingual.  Discussed with [facility] cardiology. Admit to general medicine.  I personally performed the services described in this documentation, which was scribed in my presence. The recorded information  has been reviewed and is accurate.   Nat Christen, MD 02/15/14 [phone]

## 2014-02-15 NOTE — ED Notes (Signed)
Pt with mid CP since last night with belching, mild HA, +SOB, denies N/V

## 2014-02-16 DIAGNOSIS — E669 Obesity, unspecified: Secondary | ICD-10-CM | POA: Diagnosis not present

## 2014-02-16 DIAGNOSIS — K209 Esophagitis, unspecified without bleeding: Secondary | ICD-10-CM

## 2014-02-16 DIAGNOSIS — R079 Chest pain, unspecified: Secondary | ICD-10-CM

## 2014-02-16 DIAGNOSIS — R072 Precordial pain: Secondary | ICD-10-CM | POA: Diagnosis not present

## 2014-02-16 DIAGNOSIS — I10 Essential (primary) hypertension: Secondary | ICD-10-CM | POA: Diagnosis not present

## 2014-02-16 LAB — COMPREHENSIVE METABOLIC PANEL
ALBUMIN: 3.3 g/dL — AB (ref 3.5–5.2)
ALT: 19 U/L (ref 0–35)
AST: 14 U/L (ref 0–37)
Alkaline Phosphatase: 72 U/L (ref 39–117)
Anion gap: 11 (ref 5–15)
BUN: 21 mg/dL (ref 6–23)
CHLORIDE: 103 meq/L (ref 96–112)
CO2: 25 mEq/L (ref 19–32)
Calcium: 9.4 mg/dL (ref 8.4–10.5)
Creatinine, Ser: 1.12 mg/dL — ABNORMAL HIGH (ref 0.50–1.10)
GFR calc Af Amer: 58 mL/min — ABNORMAL LOW (ref >90)
GFR calc non Af Amer: 50 mL/min — ABNORMAL LOW (ref >90)
GLUCOSE: 81 mg/dL (ref 70–99)
POTASSIUM: 4.4 meq/L (ref 3.7–5.3)
SODIUM: 139 meq/L (ref 137–147)
Total Bilirubin: 0.4 mg/dL (ref 0.3–1.2)
Total Protein: 6.2 g/dL (ref 6.0–8.3)

## 2014-02-16 LAB — TROPONIN I
Troponin I: <0.3 ng/mL (ref <0.30)
Troponin I: <0.3 ng/mL (ref <0.30)

## 2014-02-16 LAB — TSH: TSH: 1.11 u[IU]/mL (ref 0.350–4.500)

## 2014-02-16 MED ORDER — METOPROLOL SUCCINATE ER 25 MG PO TB24
12.5000 mg | ORAL_TABLET | Freq: Every day | ORAL | Status: DC
  Administered 2014-02-16 – 2014-02-17 (×2): 12.5 mg via ORAL
  Filled 2014-02-16: qty 1

## 2014-02-16 NOTE — Progress Notes (Signed)
Patient ID: Marissa [name], female   DOB: [birth year], 68 y.o.   MRN: [id number]    Subjective:    Chest pain this morning  Objective:   Temp:  [97.8 F (36.6 C)-98.5 F (36.9 C)] 97.8 F (36.6 C) (08/11 0524) Pulse Rate:  [51-113] 52 (08/11 0723) Resp:  [16-37] 17 (08/11 0723) BP: (98-141)/(48-77) 111/48 mmHg (08/11 0524) SpO2:  [97 %-100 %] 98 % (08/11 0524) Weight:  [225 lb 8.5 oz (102.3 kg)-230 lb (104.327 kg)] 225 lb 8.5 oz (102.3 kg) (08/10 1643) Last BM Date: 02/15/14  Filed Weights   02/15/14 1337 02/15/14 1643  Weight: 230 lb (104.327 kg) 225 lb 8.5 oz (102.3 kg)    Intake/Output Summary (Last 24 hours) at 02/16/14 0846 Last data filed at 02/16/14 2094  Gross per 24 hour  Intake 1760.83 ml  Output    750 ml  Net 1010.83 ml    Telemetry: NSR, sinus tach, sinus brady overnight to low 50s  Exam:  General:NAD  Resp: CTAB  Cardiac: RRR, no m/r/g, no JVD  GI: abdomen soft, NT, ND  MSK: chest wall/epigastrium tender to palpation  Neuro: no focal deficits  Psych: appropriate affect  Lab Results:  Basic Metabolic Panel:  Recent Labs Lab 02/15/14 1351 02/16/14 0556  NA 135* 139  K 3.6* 4.4  CL 97 103  CO2 22 25  GLUCOSE 104* 81  BUN 25* 21  CREATININE 1.34* 1.12*  CALCIUM 10.5 9.4    Liver Function Tests:  Recent Labs Lab 02/16/14 0556  AST 14  ALT 19  ALKPHOS 72  BILITOT 0.4  PROT 6.2  ALBUMIN 3.3*    CBC:  Recent Labs Lab 02/15/14 1351  WBC 9.1  HGB 15.5*  HCT 44.8  MCV 90.3  PLT 334    Cardiac Enzymes:  Recent Labs Lab 02/15/14 1729 02/15/14 2301 02/16/14 0556  TROPONINI <0.30 <0.30 <0.30    BNP: No results found for this basename: PROBNP,  in the last 8760 hours  Coagulation: No results found for this basename: INR,  in the last 168 hours  ECG:   Medications:   Scheduled Medications: . amLODipine  10 mg Oral Daily  . aspirin EC  81 mg Oral Daily  . heparin  5,000 Units Subcutaneous 3 times per day    . losartan  100 mg Oral Daily   And  . hydrochlorothiazide  25 mg Oral Daily  . metoprolol succinate  25 mg Oral Daily  . sodium chloride  3 mL Intravenous Q12H     Infusions: . sodium chloride 125 mL/hr at 02/16/14 0147     PRN Medications:  gi cocktail, ondansetron (ZOFRAN) IV, ondansetron     Assessment/Plan    1. Chest pain  - atypical for cardiac chest pain given continous non-stop pain since 7pm Sunday night (36 hrs). Its also associated with persistent belching, and runs from upper epigastrium to her throat. Her chest wall is very tender to palpation as well  - EKG without ischemic changes, she does have occasional PVCs. Troponin negative x3, would think if ongoing intermittent ischemia this long trop would also be positive.  - echo pending - unlikely cardiac cause based on above, if normal echo likely will not pursue further cardiac testing.   2. Sinus bradycardia - noted overnight to low 50s, likely while sleeping - given some soft blood pressures will decrease to 12.5mg  daily.   3. HTN - some soft bp's this morning - decrease Toprol XL as above,  will hold HCTZ today as she is being rehydrated. Trend down in Cr and BUN with fluid supports she was dry on admission.       Carlyle Dolly, M.D., F.A.C.C.

## 2014-02-16 NOTE — Progress Notes (Signed)
UR Completed.  Keen Ewalt Jane [phone] 02/16/2014  

## 2014-02-16 NOTE — Progress Notes (Addendum)
PROGRESS NOTE  Marissa [name] QIW:[id number] DOB: Nov 16, 1945 DOA: 02/15/2014 PCP: No primary provider on file.  Assessment/Plan: Chest pain -telemetry floor.  -Serial cardiac enzymes.  - Appreciate cardiology consultation.  -suspect noncardiac in origin, Gastroenterology consultation. Will try GI cocktail to see if this will help.   Esophagitis? Has been on 2 types of abx for rashes in the last month as well as steroids  Renal insuff: -Intravenous fluids for rehydration.   Code Status: full Family Communication:  Disposition Plan:    Consultants:    Procedures:      HPI/Subjective: Still with chest pain Poor appetite- not been eating and drinking well Eating makes chest pain worse- feels like items are stuck  Objective: Filed Vitals:   02/16/14 0723  BP:   Pulse: 52  Temp:   Resp: 17    Intake/Output Summary (Last 24 hours) at 02/16/14 0742 Last data filed at 02/16/14 0637  Gross per 24 hour  Intake 1760.83 ml  Output    750 ml  Net 1010.83 ml   Filed Weights   02/15/14 1337 02/15/14 1643  Weight: 104.327 kg (230 lb) 102.3 kg (225 lb 8.5 oz)    Exam:   General:  A+Ox3, NAD  Cardiovascular: rrr, chest wall tenderness with palpation  Respiratory: clear  Abdomen: +BS, soft  Musculoskeletal: no edema   Data Reviewed: Basic Metabolic Panel:  Recent Labs Lab 02/15/14 1351 02/16/14 0556  NA 135* 139  K 3.6* 4.4  CL 97 103  CO2 22 25  GLUCOSE 104* 81  BUN 25* 21  CREATININE 1.34* 1.12*  CALCIUM 10.5 9.4   Liver Function Tests:  Recent Labs Lab 02/16/14 0556  AST 14  ALT 19  ALKPHOS 72  BILITOT 0.4  PROT 6.2  ALBUMIN 3.3*   No results found for this basename: LIPASE, AMYLASE,  in the last 168 hours No results found for this basename: AMMONIA,  in the last 168 hours CBC:  Recent Labs Lab 02/15/14 1351  WBC 9.1  NEUTROABS 5.8  HGB 15.5*  HCT 44.8  MCV 90.3  PLT 334   Cardiac Enzymes:  Recent Labs Lab  02/15/14 1351 02/15/14 1729 02/15/14 2301 02/16/14 0556  TROPONINI <0.30 <0.30 <0.30 <0.30   BNP (last 3 results) No results found for this basename: PROBNP,  in the last 8760 hours CBG: No results found for this basename: GLUCAP,  in the last 168 hours  No results found for this or any previous visit (from the past 240 hour(s)).   Studies: Dg Chest Portable 1 View  02/15/2014   CLINICAL DATA:  Chest pain.  EXAM: PORTABLE CHEST - 1 VIEW  COMPARISON:  09/14/2008  FINDINGS: Heart size and pulmonary vascularity are normal and the lungs are clear. No effusions. No osseous abnormality.  IMPRESSION: Normal chest.   Electronically Signed   By: Rozetta Nunnery M.D.   On: 02/15/2014 14:03    Scheduled Meds: . amLODipine  10 mg Oral Daily  . aspirin EC  81 mg Oral Daily  . heparin  5,000 Units Subcutaneous 3 times per day  . losartan  100 mg Oral Daily   And  . hydrochlorothiazide  25 mg Oral Daily  . metoprolol succinate  25 mg Oral Daily  . sodium chloride  3 mL Intravenous Q12H   Continuous Infusions: . sodium chloride 125 mL/hr at 02/16/14 0147   Antibiotics Given (last 72 hours)   None      Active Problems:   Chest pain  HTN (hypertension)   Morbid obesity    Time spent: 29 min    Cecelia Graciano  [facility] Pager [phone]. If 7PM-7AM, please contact night-coverage at [email or web address], password [facility] 02/16/2014, 7:42 AM  LOS: 1 day

## 2014-02-16 NOTE — Consult Note (Signed)
Reason for Consult: chest pain Referring Physician:  Hospitalist.   Marissa [name] is an 68 y.o. female.  HPI: Admitted thru the ED last night. She c/o chest pressure radiating into her neck. Symptoms started the night before. Symptoms started while sitting on coach.  She actually thought she had indigestion. She tried Tums which did not help. She tossed and turned all night.   She received NTG in the ED and she dropped her pressure. Her chest pain was not relieved.  She received a GI cocktail which helped some. It did stop the burping.  She denies hx of doing anything strenuous over the past few days.  Today she continues to have chest pressure. Troponins are negative.  She tells me she is going to have a cardiac stress test this am. Her appetite has been good. She does tell me when she eats, she has fullness.  She rarely has indigestion. Home medications do not include a PPI.  BMs are normal. No melena or BRRB. No change in her stools.   Past Medical History  Diagnosis Date  . Hypertension   . High cholesterol     Past Surgical History  Procedure Laterality Date  . Tubal ligation    . Cardiac catheterization      2008    History reviewed. No pertinent family history.  Social History:  reports that she has never smoked. She does not have any smokeless tobacco history on file. She reports that she does not drink alcohol or use illicit drugs.  Allergies:  Allergies  Allergen Reactions  . Nitrostat [Nitroglycerin] Shortness Of Breath    Medications: I have reviewed the patient's current medications.  Results for orders placed during the hospital encounter of 02/15/14 (from the past 48 hour(s))  CBC WITH DIFFERENTIAL     Status: Abnormal   Collection Time    02/15/14  1:51 PM      Result Value Ref Range   WBC 9.1  4.0 - 10.5 K/uL   RBC 4.96  3.87 - 5.11 MIL/uL   Hemoglobin 15.5 (*) 12.0 - 15.0 g/dL   HCT 44.8  36.0 - 46.0 %   MCV 90.3  78.0 - 100.0 fL   MCH 31.3  26.0 -  34.0 pg   MCHC 34.6  30.0 - 36.0 g/dL   RDW 12.2  11.5 - 15.5 %   Platelets 334  150 - 400 K/uL   Neutrophils Relative % 64  43 - 77 %   Neutro Abs 5.8  1.7 - 7.7 K/uL   Lymphocytes Relative 28  12 - 46 %   Lymphs Abs 2.6  0.7 - 4.0 K/uL   Monocytes Relative 7  3 - 12 %   Monocytes Absolute 0.6  0.1 - 1.0 K/uL   Eosinophils Relative 1  0 - 5 %   Eosinophils Absolute 0.1  0.0 - 0.7 K/uL   Basophils Relative 0  0 - 1 %   Basophils Absolute 0.0  0.0 - 0.1 K/uL  BASIC METABOLIC PANEL     Status: Abnormal   Collection Time    02/15/14  1:51 PM      Result Value Ref Range   Sodium 135 (*) 137 - 147 mEq/L   Potassium 3.6 (*) 3.7 - 5.3 mEq/L   Chloride 97  96 - 112 mEq/L   CO2 22  19 - 32 mEq/L   Glucose, Bld 104 (*) 70 - 99 mg/dL   BUN 25 (*) 6 - 23  mg/dL   Creatinine, Ser 1.34 (*) 0.50 - 1.10 mg/dL   Calcium 10.5  8.4 - 10.5 mg/dL   GFR calc non Af Amer 40 (*) >90 mL/min   GFR calc Af Amer 46 (*) >90 mL/min   Comment: (NOTE)     The eGFR has been calculated using the CKD EPI equation.     This calculation has not been validated in all clinical situations.     eGFR's persistently <90 mL/min signify possible Chronic Kidney     Disease.   Anion gap 16 (*) 5 - 15  TROPONIN I     Status: None   Collection Time    02/15/14  1:51 PM      Result Value Ref Range   Troponin I <0.30  <0.30 ng/mL   Comment:            Due to the release kinetics of cTnI,     a negative result within the first hours     of the onset of symptoms does not rule out     myocardial infarction with certainty.     If myocardial infarction is still suspected,     repeat the test at appropriate intervals.  TSH     Status: None   Collection Time    02/15/14  1:51 PM      Result Value Ref Range   TSH 1.110  0.350 - 4.500 uIU/mL   Comment: Performed at [facility]  D-DIMER, QUANTITATIVE     Status: None   Collection Time    02/15/14  3:22 PM      Result Value Ref Range   D-Dimer, Quant 0.31  0.00 -  0.48 ug/mL-FEU   Comment:            AT THE INHOUSE ESTABLISHED CUTOFF     VALUE OF 0.48 ug/mL FEU,     THIS ASSAY HAS BEEN DOCUMENTED     IN THE LITERATURE TO HAVE     A SENSITIVITY AND NEGATIVE     PREDICTIVE VALUE OF AT LEAST     98 TO 99%.  THE TEST RESULT     SHOULD BE CORRELATED WITH     AN ASSESSMENT OF THE CLINICAL     PROBABILITY OF DVT / VTE.  TROPONIN I     Status: None   Collection Time    02/15/14  5:29 PM      Result Value Ref Range   Troponin I <0.30  <0.30 ng/mL   Comment:            Due to the release kinetics of cTnI,     a negative result within the first hours     of the onset of symptoms does not rule out     myocardial infarction with certainty.     If myocardial infarction is still suspected,     repeat the test at appropriate intervals.  TROPONIN I     Status: None   Collection Time    02/15/14 11:01 PM      Result Value Ref Range   Troponin I <0.30  <0.30 ng/mL   Comment:            Due to the release kinetics of cTnI,     a negative result within the first hours     of the onset of symptoms does not rule out     myocardial infarction with certainty.     If myocardial infarction  is still suspected,     repeat the test at appropriate intervals.  TROPONIN I     Status: None   Collection Time    02/16/14  5:56 AM      Result Value Ref Range   Troponin I <0.30  <0.30 ng/mL   Comment:            Due to the release kinetics of cTnI,     a negative result within the first hours     of the onset of symptoms does not rule out     myocardial infarction with certainty.     If myocardial infarction is still suspected,     repeat the test at appropriate intervals.  COMPREHENSIVE METABOLIC PANEL     Status: Abnormal   Collection Time    02/16/14  5:56 AM      Result Value Ref Range   Sodium 139  137 - 147 mEq/L   Potassium 4.4  3.7 - 5.3 mEq/L   Comment: DELTA CHECK NOTED   Chloride 103  96 - 112 mEq/L   CO2 25  19 - 32 mEq/L   Glucose, Bld 81  70 -  99 mg/dL   BUN 21  6 - 23 mg/dL   Creatinine, Ser 1.12 (*) 0.50 - 1.10 mg/dL   Calcium 9.4  8.4 - 10.5 mg/dL   Total Protein 6.2  6.0 - 8.3 g/dL   Albumin 3.3 (*) 3.5 - 5.2 g/dL   AST 14  0 - 37 U/L   ALT 19  0 - 35 U/L   Alkaline Phosphatase 72  39 - 117 U/L   Total Bilirubin 0.4  0.3 - 1.2 mg/dL   GFR calc non Af Amer 50 (*) >90 mL/min   GFR calc Af Amer 58 (*) >90 mL/min   Comment: (NOTE)     The eGFR has been calculated using the CKD EPI equation.     This calculation has not been validated in all clinical situations.     eGFR's persistently <90 mL/min signify possible Chronic Kidney     Disease.   Anion gap 11  5 - 15    Dg Chest Portable 1 View  02/15/2014   CLINICAL DATA:  Chest pain.  EXAM: PORTABLE CHEST - 1 VIEW  COMPARISON:  09/14/2008  FINDINGS: Heart size and pulmonary vascularity are normal and the lungs are clear. No effusions. No osseous abnormality.  IMPRESSION: Normal chest.   Electronically Signed   By: Rozetta Nunnery M.D.   On: 02/15/2014 14:03    ROS Blood pressure 111/48, pulse 52, temperature 97.8 F (36.6 C), temperature source Oral, resp. rate 17, height _0  (1.626 m), weight 225 lb 8.5 oz (102.3 kg), SpO2 98.00%. Physical Exam Alert and oriented. Skin warm and dry. Oral mucosa is moist.   . Sclera anicteric, conjunctivae is pink. Thyroid not enlarged. No cervical lymphadenopathy. Lungs clear. Heart regular rate and rhythm. Chest tenderness with gentle pressure.  Abdomen is soft. Bowel sounds are positive. No hepatomegaly. No abdominal masses felt. No tenderness.  No edema to lower extremities.   Assessment/Plan: Chest pain. ? Etiology.  She has chest tenderness on palpation. Troponins have been negative. I will discuss with Dr. Laural Golden. Will wait for cardiac work up to be completed before proceeding.    SETZER,TERRI W 02/16/2014, 8:13 AM     GI attending note; Patient interviewed examined; Patient is 68 year old Caucasian female who presents with  prolonged chest pain and negative troponin  levels. Chest vein appears to be multifactorial. For one she has chest wall tenderness. She does not have typical symptoms of GERD. LFTs are normal. If cardiac evaluation is negative would consider EGD an upper abdominal ultrasound.

## 2014-02-16 NOTE — Care Management Note (Addendum)
    Page 1 of 1   02/18/2014     12:04:02 PM CARE MANAGEMENT NOTE 02/18/2014  Patient:  Marissa [name], Marissa [name]   Account Number:  [id number]  Date Initiated:  02/16/2014  Documentation initiated by:  Jolene Provost  Subjective/Objective Assessment:   Patient admitted with chest pain. Patient from home, lives with husband. Patient is independent with ADL's, has no DME's, and no HH needs.     Action/Plan:   Patient plans to discharge home with self care. Patient has no CM needs at this time.   Anticipated DC Date:  02/17/2014   Anticipated DC Plan:  [place]  CM consult      Choice offered to / List presented to:             Status of service:  Completed, signed off Medicare Important Message given?   (If response is "NO", the following Medicare IM given date fields will be blank) Date Medicare IM given:   Medicare IM given by:   Date Additional Medicare IM given:   Additional Medicare IM given by:    Discharge Disposition:  HOME/SELF CARE  Per UR Regulation:    If discussed at Long Length of Stay Meetings, dates discussed:    Comments:  02/18/2014 [place], RN, MSN, PCCN Patient to be discharged home today with self care. No CM needs identified at this time.   02/16/2014 [place], RN, MSN, [employer]

## 2014-02-17 ENCOUNTER — Observation Stay (HOSPITAL_COMMUNITY): Payer: Medicare Other

## 2014-02-17 DIAGNOSIS — I10 Essential (primary) hypertension: Secondary | ICD-10-CM

## 2014-02-17 DIAGNOSIS — I369 Nonrheumatic tricuspid valve disorder, unspecified: Secondary | ICD-10-CM

## 2014-02-17 DIAGNOSIS — K209 Esophagitis, unspecified without bleeding: Secondary | ICD-10-CM

## 2014-02-17 DIAGNOSIS — R072 Precordial pain: Secondary | ICD-10-CM | POA: Diagnosis not present

## 2014-02-17 MED ORDER — PANTOPRAZOLE SODIUM 40 MG PO TBEC
40.0000 mg | DELAYED_RELEASE_TABLET | Freq: Two times a day (BID) | ORAL | Status: DC
  Administered 2014-02-17 – 2014-02-18 (×3): 40 mg via ORAL
  Filled 2014-02-17 (×3): qty 1

## 2014-02-17 MED ORDER — IOHEXOL 350 MG/ML SOLN
100.0000 mL | Freq: Once | INTRAVENOUS | Status: AC | PRN
  Administered 2014-02-17: 100 mL via INTRAVENOUS

## 2014-02-17 NOTE — Progress Notes (Signed)
Subjective; Patient feels much better today. She's not having any more testing. She said she sat upright after evening meal and did not experience regurgitation. She denies shortness of breath or abdominal pain. She states she's never had abdominal pain. She is waiting for echo to be done today.  Objective; BP 116/54  Pulse 59  Temp(Src) 97.5 F (36.4 C) (Oral)  Resp 20  Ht 5\' 4"  (1.626 m)  Wt 225 lb 8.5 oz (102.3 kg)  BMI 38.69 kg/m2  SpO2 98% Patient is alert and in no acute distress. She has no chest wall tenderness today. Abdomen is also soft and nontender without organomegaly or masses.  Lab data; All for troponin levels have been less than 0.30  Assessment; Atypical chest pain. She is ruled out for MI. Echo is pending. If echo is normal will proceed with EGD. In the meantime will start her on pantoprazole 40 mg by mouth twice a day. Patient had subcutaneous heparin at 6:44 AM; therefore we'll wait for 5 to 6 hours before EGD done. Patient made n.p.o. except for by mouth medications.

## 2014-02-17 NOTE — Progress Notes (Signed)
UR Completed Haunani Dickard Graves-Bigelow, RN,BSN [phone]  

## 2014-02-17 NOTE — Progress Notes (Signed)
PROGRESS NOTE  Marissa [name] JSH:[id number] DOB: 07/18/1945 DOA: 02/15/2014 PCP: No primary provider on file.  Assessment/Plan: Chest pain -telemetry floor, monitor showing no changes  -Serial cardiac enzymes remain negative x3 sets  - Transthoracic echocardiogram showing abnormal calcified structures in the RV apex lead he secondary calcified papillary muscles. Case discussed with cardiology who recommended CT PE to assess for the possibility of embolic event. -Patient likely to undergo EGD today  Esophagitis? Has been on 2 types of abx for rashes in the last month as well as steroids -GI consulted, possible EGD planned for today  Renal insuff: -Intravenous fluids for rehydration.   Code Status: full Family Communication:  Disposition Plan:    Consultants: GI Cardiology  Procedures:      HPI/Subjective: Patient reports having chest pain located in the retrosternal and left-sided regions, somewhat improved today. She denies nausea vomiting fevers or chills  Objective: Filed Vitals:   02/17/14 0900  BP:   Pulse: 63  Temp:   Resp:     Intake/Output Summary (Last 24 hours) at 02/17/14 1245 Last data filed at 02/17/14 0500  Gross per 24 hour  Intake 1391.25 ml  Output   1475 ml  Net -83.75 ml   Filed Weights   02/15/14 1337 02/15/14 1643  Weight: 104.327 kg (230 lb) 102.3 kg (225 lb 8.5 oz)    Exam:   General:  A+Ox3, NAD  Cardiovascular: rrr, chest wall tenderness with palpation  Respiratory: clear  Abdomen: +BS, soft  Musculoskeletal: no edema   Data Reviewed: Basic Metabolic Panel:  Recent Labs Lab 02/15/14 1351 02/16/14 0556  NA 135* 139  K 3.6* 4.4  CL 97 103  CO2 22 25  GLUCOSE 104* 81  BUN 25* 21  CREATININE 1.34* 1.12*  CALCIUM 10.5 9.4   Liver Function Tests:  Recent Labs Lab 02/16/14 0556  AST 14  ALT 19  ALKPHOS 72  BILITOT 0.4  PROT 6.2  ALBUMIN 3.3*   No results found for this basename: LIPASE, AMYLASE,   in the last 168 hours No results found for this basename: AMMONIA,  in the last 168 hours CBC:  Recent Labs Lab 02/15/14 1351  WBC 9.1  NEUTROABS 5.8  HGB 15.5*  HCT 44.8  MCV 90.3  PLT 334   Cardiac Enzymes:  Recent Labs Lab 02/15/14 1351 02/15/14 1729 02/15/14 2301 02/16/14 0556  TROPONINI <0.30 <0.30 <0.30 <0.30   BNP (last 3 results) No results found for this basename: PROBNP,  in the last 8760 hours CBG: No results found for this basename: GLUCAP,  in the last 168 hours  No results found for this or any previous visit (from the past 240 hour(s)).   Studies: Dg Chest Portable 1 View  02/15/2014   CLINICAL DATA:  Chest pain.  EXAM: PORTABLE CHEST - 1 VIEW  COMPARISON:  09/14/2008  FINDINGS: Heart size and pulmonary vascularity are normal and the lungs are clear. No effusions. No osseous abnormality.  IMPRESSION: Normal chest.   Electronically Signed   By: Rozetta Nunnery M.D.   On: 02/15/2014 14:03    Scheduled Meds: . amLODipine  10 mg Oral Daily  . aspirin EC  81 mg Oral Daily  . heparin  5,000 Units Subcutaneous 3 times per day  . losartan  100 mg Oral Daily  . pantoprazole  40 mg Oral BID AC  . sodium chloride  3 mL Intravenous Q12H   Continuous Infusions: . sodium chloride 125 mL/hr at 02/17/14 [id number]  Antibiotics Given (last 72 hours)   None      Active Problems:   Chest pain   HTN (hypertension)   Morbid obesity    Time spent: 25 min    Kelvin Cellar  [facility] Pager [phone]. If 7PM-7AM, please contact night-coverage at [email or web address], password [facility] 02/17/2014, 12:45 PM  LOS: 2 days

## 2014-02-17 NOTE — Progress Notes (Signed)
Echo and CTA chest results noted. Will postpone esophagogastroduodenoscopy until cardiac evaluation completed. EGD can be performed on an outpatient basis. Continue pantoprazole at 40 mg by mouth twice a day.

## 2014-02-17 NOTE — Progress Notes (Signed)
Subjective:  Chest pain resolved  Objective:  Vital Signs in the last 24 hours: Temp:  [97.5 F (36.4 C)-98.7 F (37.1 C)] 97.5 F (36.4 C) (08/12 0621) Pulse Rate:  [58-63] 59 (08/12 0621) Resp:  [20] 20 (08/12 0621) BP: (107-148)/(39-74) 116/54 mmHg (08/12 0621) SpO2:  [97 %-100 %] 98 % (08/12 0621)  Intake/Output from previous day: 08/11 0701 - 08/12 0700 In: 1954.2 [P.O.:600; I.V.:1354.2] Out: 2863 [Urine:1825] Intake/Output from this shift:    Physical Exam: NECK: Without JVD, HJR, or bruit LUNGS: Clear anterior, posterior, lateral HEART: Regular rate and rhythm, no murmur, gallop, rub, bruit, thrill, or heave EXTREMITIES: Without cyanosis, clubbing, or edema   Lab Results:  Recent Labs  02/15/14 1351  WBC 9.1  HGB 15.5*  PLT 334    Recent Labs  02/15/14 1351 02/16/14 0556  NA 135* 139  K 3.6* 4.4  CL 97 103  CO2 22 25  GLUCOSE 104* 81  BUN 25* 21  CREATININE 1.34* 1.12*    Recent Labs  02/15/14 2301 02/16/14 0556  TROPONINI <0.30 <0.30   Hepatic Function Panel  Recent Labs  02/16/14 0556  PROT 6.2  ALBUMIN 3.3*  AST 14  ALT 19  ALKPHOS 72  BILITOT 0.4   No results found for this basename: CHOL,  in the last 72 hours No results found for this basename: PROTIME,  in the last 72 hours  Imaging:   Cardiac Studies:  Assessment/Plan:  1. Chest pain   - atypical for cardiac chest pain given continous non-stop pain since 7pm Sunday night. Its also associated with persistent belching, and runs from upper epigastrium to her throat. Her chest wall is very tender to palpation as well   - EKG without ischemic changes, she does have occasional PVCs. Troponin negative x3, would think if ongoing intermittent ischemia this long trop would also be positive.   - echo pending - unlikely cardiac cause based on above, if normal echo likely will not pursue further cardiac testing.   2. HTN -stable on lower dose Toprol.  -continue to hold HCTZ today  as she is being rehydrated. Trend down in Cr and BUN with fluid supports she was dry on admission.        LOS: 2 days    Ermalinda Barrios 02/17/2014, 8:45 AM  Patient seen and discussed with PA Lenze. Atypical chest pain in description and duration, she describes constant non-relenting pain since Sunday night and just resolved Tuesday night. No evidence of ACS. She describes after eating dinner sour burning taste/feeling in her throat and mouth last night, resolved with sitting up.  We are awaiting echo results, if normal will not pursue any further cardiac testing. Will stop her beta blocker due to sinus bradycardia.    Zandra Abts MD

## 2014-02-17 NOTE — Progress Notes (Signed)
Echo completed, normal LV systolic function with no wall motion abnormalities. Given normal echo and very atypical chest pain will not pursue ischemic testing. Her echo showed abnormal calcified structures in the RV apex, most likely calcified papillary muscles. Cannot rule out possible prominent calcified trabeculations or calcified thrombus. There remains a low suspicion for PE given her clinical presentation and negative D-dimer however with this echo finding and currently unexplained chest pain the possibility does remain. Recommend CT PE to rule out embolic event as cause of her chest pain. She will need an outpatient cardiac MRI to further evaluate her RV.   Zandra Abts MD

## 2014-02-17 NOTE — Progress Notes (Signed)
*  PRELIMINARY RESULTS* Echocardiogram 2D Echocardiogram has been performed.  Leavy Cella 02/17/2014, 9:12 AM

## 2014-02-18 ENCOUNTER — Other Ambulatory Visit: Payer: Self-pay

## 2014-02-18 DIAGNOSIS — R072 Precordial pain: Secondary | ICD-10-CM | POA: Diagnosis not present

## 2014-02-18 DIAGNOSIS — R229 Localized swelling, mass and lump, unspecified: Principal | ICD-10-CM

## 2014-02-18 DIAGNOSIS — R079 Chest pain, unspecified: Secondary | ICD-10-CM

## 2014-02-18 DIAGNOSIS — IMO0002 Reserved for concepts with insufficient information to code with codable children: Secondary | ICD-10-CM

## 2014-02-18 LAB — CBC
HCT: 41.8 % (ref 36.0–46.0)
HEMOGLOBIN: 14.1 g/dL (ref 12.0–15.0)
MCH: 31.1 pg (ref 26.0–34.0)
MCHC: 33.7 g/dL (ref 30.0–36.0)
MCV: 92.3 fL (ref 78.0–100.0)
PLATELETS: 223 10*3/uL (ref 150–400)
RBC: 4.53 MIL/uL (ref 3.87–5.11)
RDW: 12.2 % (ref 11.5–15.5)
WBC: 6.6 10*3/uL (ref 4.0–10.5)

## 2014-02-18 MED ORDER — PANTOPRAZOLE SODIUM 40 MG PO TBEC: 40.0000 mg | DELAYED_RELEASE_TABLET | Freq: Two times a day (BID) | ORAL | Status: DC

## 2014-02-18 NOTE — Progress Notes (Signed)
Very atypical chest pain, no clear cause from cardiac standpoint. Do not see indication for stress testing at this time. She will need an outpatient cardiac MRI for abnormality noted in her RV (calcified papillary muscle, vs trabeculation, vs calcified thrombus). I feel most likely it is a calcified papillary muscle but MRI will help confirm. No further cardiac testing at this time. She may follow up with Jory Sims in 2-3 weeks, will talk with our staff about scheduling cardiac MRI. Will signoff from inpatient standpoint.    Zandra Abts MD

## 2014-02-18 NOTE — Progress Notes (Signed)
D/c instructions reviewed with patient.  Verbalized understanding.  Pt dc'd to home with husband. Schonewitz, Eulis Canner 02/18/2014

## 2014-02-18 NOTE — Discharge Summary (Signed)
Physician Discharge Summary  Marissa [name] ZTI:[id number] DOB: [birth year] DOA: 02/15/2014  PCP: No primary provider on file.  Admit date: 02/15/2014 Discharge date: 02/18/2014  Time spent: 35 minutes  Recommendations for Outpatient Follow-up:  1. Please followup on patient's chest pain symptoms, she underwent workup for her chest pain during this hospitalization, felt unlikely cardiac related.   Discharge Diagnoses:  Active Problems:   Chest pain   HTN (hypertension)   Morbid obesity   Discharge Condition: Stable/improved  Diet recommendation: Heart healthy diet  Filed Weights   02/15/14 1337 02/15/14 1643  Weight: 104.327 kg (230 lb) 102.3 kg (225 lb 8.5 oz)    History of present illness:  Marissa [name] is a 68 y.o. female  This is a 68 year old lady who gives a almost 24 hour history of almost continuous chest pain that is from the lower end of the anterior chest to the neck. She says it feels like she has a dull pain and heaviness in her chest. It is not associated with nausea, excessive sweating but was associated with dyspnea. She does have a history of cardiac catheterization several years ago which showed essentially normal coronary arteries. She is now being there for further investigation.   Hospital Course:  Patient is a pleasant 68 year old female with a past medical history of hypertension, admitted to medicine service on 02/15/2014 when she presented with complaints of chest pain. Her chest pain had atypical features. She was admitted to telemetry where troponins were cycled and remained negative. She was seen and evaluated by cardiology. A transthoracic echocardiogram performed on 02/17/2014 showing ejection fraction of 55-60%, indeterminate diastolic function. Wall motion was normal, no regional wall motion abnormalities noted. A CT scan with IV contrast of lungs was negative for pulmonary embolism. Gastroenterology was consulted as well. She was started on PPI  therapy with Protonix 40 mg twice a day. Patient likely to undergo further workup with EGD as an outpatient. Causes of chest pain could be musculoskeletal in origin as she had pain to palpation over precordial region. This could also be related to gastroesophageal reflux disease. By 02/18/2014 she reported feeling well enough to go home, was tolerating by mouth intake, discharged in stable condition.  Procedures:  Transthoracic echocardiogram performed on 02/17/2014 showing ejection fraction of 55-60%.  Consultations:  Cardiology  Gastroenterology  Discharge Exam: Filed Vitals:   02/18/14 0551  BP: 143/64  Pulse: 78  Temp: 98.2 F (36.8 C)  Resp: 17    General: Patient is in no acute distress she is awake alert and oriented, states feeling better, tolerating by mouth intake Cardiovascular: Regular rate and rhythm normal S1-S2 no murmurs rubs or gallops Respiratory: Normal respiratory effort, clear to auscultation bilaterally Abdomen: Soft nontender nondistended  Discharge Instructions You were cared for by a hospitalist during your hospital stay. If you have any questions about your discharge medications or the care you received while you were in the hospital after you are discharged, you can call the unit and asked to speak with the hospitalist on call if the hospitalist that took care of you is not available. Once you are discharged, your primary care physician will handle any further medical issues. Please note that NO REFILLS for any discharge medications will be authorized once you are discharged, as it is imperative that you return to your primary care physician (or establish a relationship with a primary care physician if you do not have one) for your aftercare needs so that they can reassess your  need for medications and monitor your lab values.  Discharge Instructions   Call MD for:  difficulty breathing, headache or visual disturbances    Complete by:  As directed      Call  MD for:  extreme fatigue    Complete by:  As directed      Call MD for:  persistant dizziness or light-headedness    Complete by:  As directed      Call MD for:  persistant nausea and vomiting    Complete by:  As directed      Call MD for:  severe uncontrolled pain    Complete by:  As directed      Call MD for:  temperature >100.4    Complete by:  As directed      Diet - low sodium heart healthy    Complete by:  As directed      Discharge instructions    Complete by:  As directed   Please follow up with your Family Physician in 1-2 weeks     Increase activity slowly    Complete by:  As directed             Medication List    STOP taking these medications       sulfamethoxazole-trimethoprim 800-160 MG per tablet  Commonly known as:  BACTRIM DS      TAKE these medications       amLODipine 10 MG tablet  Commonly known as:  NORVASC  Take 10 mg by mouth daily.     aspirin EC 81 MG tablet  Take 81 mg by mouth daily.     losartan-hydrochlorothiazide 100-25 MG per tablet  Commonly known as:  HYZAAR  Take 1 tablet by mouth daily.     metoprolol succinate 25 MG 24 hr tablet  Commonly known as:  TOPROL-XL  Take 25 mg by mouth daily.     pantoprazole 40 MG tablet  Commonly known as:  PROTONIX  Take 1 tablet (40 mg total) by mouth 2 (two) times daily before a meal.       Allergies  Allergen Reactions  . Nitrostat [Nitroglycerin] Shortness Of Breath       Follow-up Information   Follow up with REHMAN,NAJEEB U, MD In 2 weeks.   Specialty:  Gastroenterology   Contact information:   [address], [address] [place] [address] [phone]        The results of significant diagnostics from this hospitalization (including imaging, microbiology, ancillary and laboratory) are listed below for reference.    Significant Diagnostic Studies: Ct Angio Chest Pe W/cm &/or Wo Cm  02/17/2014   CLINICAL DATA:  Anterior chest pain and shortness of breath.  EXAM: CT  ANGIOGRAPHY CHEST WITH CONTRAST  TECHNIQUE: Multidetector CT imaging of the chest was performed using the standard protocol during bolus administration of intravenous contrast. Multiplanar CT image reconstructions and MIPs were obtained to evaluate the vascular anatomy.  CONTRAST:  148mL OMNIPAQUE IOHEXOL 350 MG/ML SOLN  COMPARISON:  12/10/2006  FINDINGS: Negative for a pulmonary embolism. There is a small hiatal hernia. There is no significant mediastinal, hilar or axillary lymphadenopathy. Images of the upper abdomen are unremarkable. There is no significant pericardial or pleural fluid.  The trachea and mainstem bronchi are patent. Few densities in the superior segment of the right lower lobe are most compatible with atelectasis. There is no significant airspace disease or consolidation in the lungs. No acute bone abnormality.  Review of the  MIP images confirms the above findings.  IMPRESSION: Negative for a pulmonary embolism.  No acute chest abnormality. Small amount of atelectasis in the right lower lobe.   Electronically Signed   By: Markus Daft M.D.   On: 02/17/2014 14:29   Dg Chest Portable 1 View  02/15/2014   CLINICAL DATA:  Chest pain.  EXAM: PORTABLE CHEST - 1 VIEW  COMPARISON:  09/14/2008  FINDINGS: Heart size and pulmonary vascularity are normal and the lungs are clear. No effusions. No osseous abnormality.  IMPRESSION: Normal chest.   Electronically Signed   By: Rozetta Nunnery M.D.   On: 02/15/2014 14:03    Microbiology: No results found for this or any previous visit (from the past 240 hour(s)).   Labs: Basic Metabolic Panel:  Recent Labs Lab 02/15/14 1351 02/16/14 0556  NA 135* 139  K 3.6* 4.4  CL 97 103  CO2 22 25  GLUCOSE 104* 81  BUN 25* 21  CREATININE 1.34* 1.12*  CALCIUM 10.5 9.4   Liver Function Tests:  Recent Labs Lab 02/16/14 0556  AST 14  ALT 19  ALKPHOS 72  BILITOT 0.4  PROT 6.2  ALBUMIN 3.3*   No results found for this basename: LIPASE, AMYLASE,  in the  last 168 hours No results found for this basename: AMMONIA,  in the last 168 hours CBC:  Recent Labs Lab 02/15/14 1351 02/18/14 0618  WBC 9.1 6.6  NEUTROABS 5.8  --   HGB 15.5* 14.1  HCT 44.8 41.8  MCV 90.3 92.3  PLT 334 223   Cardiac Enzymes:  Recent Labs Lab 02/15/14 1351 02/15/14 1729 02/15/14 2301 02/16/14 0556  TROPONINI <0.30 <0.30 <0.30 <0.30   BNP: BNP (last 3 results) No results found for this basename: PROBNP,  in the last 8760 hours CBG: No results found for this basename: GLUCAP,  in the last 168 hours     Signed:  Kelvin Cellar  [facility] 02/18/2014, 11:37 AM

## 2014-02-22 ENCOUNTER — Telehealth (INDEPENDENT_AMBULATORY_CARE_PROVIDER_SITE_OTHER): Payer: Self-pay | Admitting: *Deleted

## 2014-02-22 ENCOUNTER — Other Ambulatory Visit: Payer: Self-pay

## 2014-02-22 ENCOUNTER — Telehealth: Payer: Self-pay

## 2014-02-22 DIAGNOSIS — I10 Essential (primary) hypertension: Secondary | ICD-10-CM

## 2014-02-22 NOTE — Telephone Encounter (Signed)
Per Dr.Branch order placed for Cardiac MRI,Terry Jennette Kettle at [facility] office to schedule

## 2014-02-22 NOTE — Telephone Encounter (Signed)
Per Dr.Rehman after Marissa [name] has her cardiac work up ,Dr.Rehman states that he will do EGD. Forwarded to Terri as FYI as the patient has an appointment with her later this month.

## 2014-02-22 NOTE — Telephone Encounter (Signed)
Message copied by Bernita Raisin on Mon Feb 22, 2014  9:10 AM ------      Message from: [place] F      Created: Thu Feb 18, 2014  8:38 AM       Patient needs cardiac MRI arranged for RV mass, and also follow up with [facility] in 2 weeks. Currently she is inpatient.                  Zandra Abts MD ------

## 2014-02-23 ENCOUNTER — Encounter: Payer: Self-pay | Admitting: Cardiology

## 2014-03-04 ENCOUNTER — Ambulatory Visit (INDEPENDENT_AMBULATORY_CARE_PROVIDER_SITE_OTHER): Payer: Medicare Other | Admitting: Internal Medicine

## 2014-03-04 ENCOUNTER — Encounter (INDEPENDENT_AMBULATORY_CARE_PROVIDER_SITE_OTHER): Payer: Self-pay | Admitting: Internal Medicine

## 2014-03-04 VITALS — BP 150/68 | HR 60 | Temp 97.6°F | Ht 65.0 in | Wt 227.7 lb

## 2014-03-04 DIAGNOSIS — R0789 Other chest pain: Secondary | ICD-10-CM

## 2014-03-04 NOTE — Progress Notes (Signed)
Subjective:    Patient ID: Marissa [name], female    DOB: 1946-01-16, 68 y.o.   MRN: [id number]  HPI Recently admitted to [facility] on 02/17/2014 with chest pain radiating into her neck. He symptoms started the night before admission.  She actually thought she had indigestion. She tried Tums which did not help.  Occurs maybe 1-2 times a day.  Pain usually last a few seconds.  While in the ED before admission she received a GI cocktail which helped some.  While in the hospital, her troponin's were negative.  She did not have a stress test while in the hospital. She tells me today she has a sensation of fullness. . She still has chest pain. When she has the pain, she will change position and the pain will resolve. The pain is located in her upper chest. The pain is random. She cannot tell me if the pain is related to eating.  Appetite is good. She can eat anything she wants. She occasionally has acid reflux, and will take a Tum. There is no abdominal pain. She usually has a BM 1-2 a day. No melena or BRRB.     Scheduled MR Card Morphology w/wo 03/09/2014.    02/17/2014 CT angio chest with/without CM: Negative for a pulmonary embolism.  No acute chest abnormality. Small amount of atelectasis in the right  lower lobe.   Echo cardiogram 02/17/2014 EF: 60  CBC    Component Value Date/Time   WBC 6.6 02/18/2014 0618   RBC 4.53 02/18/2014 0618   HGB 14.1 02/18/2014 0618   HCT 41.8 02/18/2014 0618   PLT 223 02/18/2014 0618   MCV 92.3 02/18/2014 0618   MCH 31.1 02/18/2014 0618   MCHC 33.7 02/18/2014 0618   RDW 12.2 02/18/2014 0618   LYMPHSABS 2.6 02/15/2014 1351   MONOABS 0.6 02/15/2014 1351   EOSABS 0.1 02/15/2014 1351   BASOSABS 0.0 02/15/2014 1351   CMP     Component Value Date/Time   NA 139 02/16/2014 0556   K 4.4 02/16/2014 0556   CL 103 02/16/2014 0556   CO2 25 02/16/2014 0556   GLUCOSE 81 02/16/2014 0556   BUN 21 02/16/2014 0556   CREATININE 1.12* 02/16/2014 0556   CALCIUM 9.4 02/16/2014 0556   PROT 6.2 02/16/2014 0556   ALBUMIN 3.3* 02/16/2014 0556   AST 14 02/16/2014 0556   ALT 19 02/16/2014 0556   ALKPHOS 72 02/16/2014 0556   BILITOT 0.4 02/16/2014 0556   GFRNONAA 50* 02/16/2014 0556   GFRAA 58* 02/16/2014 0556       Review of Systems Past Medical History  Diagnosis Date  . Hypertension   . High cholesterol     Past Surgical History  Procedure Laterality Date  . Tubal ligation    . Cardiac catheterization      2008    Allergies  Allergen Reactions  . Nitrostat [Nitroglycerin] Shortness Of Breath    Current Outpatient Prescriptions on File Prior to Visit  Medication Sig Dispense Refill  . amLODipine (NORVASC) 10 MG tablet Take 10 mg by mouth daily.      Marland Kitchen aspirin EC 81 MG tablet Take 81 mg by mouth daily.      Marland Kitchen losartan-hydrochlorothiazide (HYZAAR) 100-25 MG per tablet Take 1 tablet by mouth daily.      . metoprolol succinate (TOPROL-XL) 25 MG 24 hr tablet Take 25 mg by mouth daily.       No current facility-administered medications on file prior to visit.  Objective:   Physical Exam  Filed Vitals:   03/04/14 1032  BP: 150/68  Pulse: 60  Temp: 97.6 F (36.4 C)  Height: 5\' 5"  (1.651 m)  Weight: 227 lb 11.2 oz (103.284 kg)  Alert and oriented. Skin warm and dry. Oral mucosa is moist.   . Sclera anicteric, conjunctivae is pink. Thyroid not enlarged. No cervical lymphadenopathy. Lungs clear. Heart regular rate and rhythm.  Abdomen is soft. Bowel sounds are positive. No hepatomegaly. No abdominal masses felt. No tenderness.  No edema to lower extremities.      Assessment & Plan:  Chest pain: MR card. 1st of September. She has not been released from Cardiology. She has appt with Cardiology on the 9th of this month

## 2014-03-04 NOTE — Patient Instructions (Addendum)
Will await clearance from cardiology and then will proceed with an EGD. Will bring possible a release on the 9th of September.

## 2014-03-09 ENCOUNTER — Ambulatory Visit (HOSPITAL_COMMUNITY)
Admission: RE | Admit: 2014-03-09 | Discharge: 2014-03-09 | Disposition: A | Discharge status: Home / Self Care | Payer: Medicare Other | Source: Ambulatory Visit | Attending: Cardiology | Admitting: Cardiology

## 2014-03-09 DIAGNOSIS — I517 Cardiomegaly: Secondary | ICD-10-CM | POA: Insufficient documentation

## 2014-03-09 DIAGNOSIS — R229 Localized swelling, mass and lump, unspecified: Secondary | ICD-10-CM

## 2014-03-09 DIAGNOSIS — IMO0002 Reserved for concepts with insufficient information to code with codable children: Secondary | ICD-10-CM

## 2014-03-09 MED ORDER — GADOBENATE DIMEGLUMINE 529 MG/ML IV SOLN
33.0000 mL | Freq: Once | INTRAVENOUS | Status: AC | PRN
  Administered 2014-03-09: 33 mL via INTRAVENOUS

## 2014-03-10 ENCOUNTER — Telehealth: Payer: Self-pay | Admitting: *Deleted

## 2014-03-10 NOTE — Telephone Encounter (Signed)
Notified pt of results confirmed appt for 9/9 with Estella Husk, PA

## 2014-03-17 ENCOUNTER — Encounter: Payer: Self-pay | Admitting: Physician Assistant

## 2014-03-17 ENCOUNTER — Ambulatory Visit (INDEPENDENT_AMBULATORY_CARE_PROVIDER_SITE_OTHER): Payer: Medicare Other | Admitting: Physician Assistant

## 2014-03-17 VITALS — BP 142/92 | HR 64 | Ht 65.0 in | Wt 229.0 lb

## 2014-03-17 DIAGNOSIS — R079 Chest pain, unspecified: Secondary | ICD-10-CM

## 2014-03-17 DIAGNOSIS — I10 Essential (primary) hypertension: Secondary | ICD-10-CM

## 2014-03-17 NOTE — Progress Notes (Signed)
HPI: This is a 68 year old female patient who was admitted to the hospital with atypical chest pain. Troponins were negative and 2-D echo 02/17/14 EF 55-60%, and indeterminate diastolic function. CT scan negative for pulmonary embolus and GI placed her on a PPI.  Chest pain could also be musculoskeletal as she had pain to palpation over the precordial region. Dr. Harl Bowie did not feel there was an indication for stress testing. She needed a cardiac MRI for abnormality noted in her RV (calcified papillary muscle versus trabeculation versus calcified thrombus). He felt it was most likely a calcified papillary muscle but MRI will help confirm.  Dr. Harl Bowie reviewed her MRI and feels the abnormal area we saw on echo is only calcified normal heart muscle and there is no concerning finding in that area.  The patient feels much better since she's been on a PPI. She is going to have an endoscopy. She no longer has any pain to palpation over her chest area. She hasn't had any further chest pain and denies dyspnea dyspnea on exertion palpitations or presyncope. Her blood pressure has been up since insurance stopped paying for one of her medications and Dr. Brigitte Pulse is adjusting her other BP meds.  Allergies  Allergen Reactions  . Nitrostat [Nitroglycerin] Shortness Of Breath     Current Outpatient Prescriptions  Medication Sig Dispense Refill  . amLODipine (NORVASC) 10 MG tablet Take 10 mg by mouth daily.      Marland Kitchen aspirin EC 81 MG tablet Take 81 mg by mouth daily.      Marland Kitchen losartan-hydrochlorothiazide (HYZAAR) 100-25 MG per tablet Take 1 tablet by mouth daily.      . metoprolol succinate (TOPROL-XL) 25 MG 24 hr tablet Take 25 mg by mouth daily.      Marland Kitchen omeprazole (PRILOSEC OTC) 20 MG tablet Take 20 mg by mouth daily.       No current facility-administered medications for this visit.    Past Medical History  Diagnosis Date  . Hypertension   . High cholesterol     Past Surgical History  Procedure  Laterality Date  . Tubal ligation    . Cardiac catheterization      2008    No family history on file.  History   Social History  . Marital Status: Married    Spouse Name: N/A    Number of Children: N/A  . Years of Education: N/A   Occupational History  . Not on file.   Social History Main Topics  . Smoking status: Never Smoker   . Smokeless tobacco: Never Used  . Alcohol Use: No  . Drug Use: No  . Sexual Activity: Not on file   Other Topics Concern  . Not on file   Social History Narrative  . No narrative on file    ROS: See history of present illness otherwise negative  BP 142/92  Pulse 64  Ht 5\' 5"  (1.651 m)  Wt 229 lb (103.874 kg)  BMI 38.11 kg/m2  SpO2 99%  PHYSICAL EXAM: Obese, in no acute distress. Neck: No JVD, HJR, Bruit, or thyroid enlargement  Lungs: No tachypnea, clear without wheezing, rales, or rhonchi  Cardiovascular: RRR, PMI not displaced, heart sounds normal, no murmurs, gallops, bruit, thrill, or heave.  Abdomen: BS normal. Soft without organomegaly, masses, lesions or tenderness.  Extremities: without cyanosis, clubbing or edema. Good distal pulses bilateral  SKin: Warm, no lesions or rashes   Musculoskeletal: No deformities  Neuro: no focal signs  Wt Readings from Last 3 Encounters:  03/17/14 229 lb (103.874 kg)  03/04/14 227 lb 11.2 oz (103.284 kg)  02/15/14 225 lb 8.5 oz (102.3 kg)   EKG normal sinus rhythm, nonspecific ST-T wave changes, no acute change  Cardiac MRI 03/09/14 IMPRESSION: 1) No RV mass review of echo suggests calcification of the apical RV trabeculations No evidence of tumor or thrombus   2) Normal LV size and function EF 60%   3) Moderate LAE   4) Normal MV, TV and AV   5) Prominent epicardial fat with no effusion   6) No delayed gadolinium uptake in LV myocardium   Jenkins Rouge     Electronically Signed   By: Jenkins Rouge M.D.   On: 03/09/2014 15:09   2-D echo 02/17/14 Study  Conclusions  - Left ventricle: The cavity size was normal. Wall thickness was   increased in a pattern of mild LVH. Systolic function was normal.   The estimated ejection fraction was in the range of 55% to 60%.   Wall motion was normal; there were no regional wall motion   abnormalities. - Aortic valve: Mildly calcified annulus. Trileaflet; mildly   thickened leaflets. Valve area (VTI): 2.25 cm^2. Valve area   (Vmax): 2.16 cm^2. - Mitral valve: Mildly calcified annulus. Mildly thickened leaflets   . - Left atrium: The atrium was moderately dilated. - Right ventricle: The cavity size was mildly to moderately   dilated. There is a 25 x 9 mm calcified structure adherent to the   RV free wall near the apex. There is a similar structure slightly   more distal toward the apex measuring 7 x 7 mm. Differential   includes prominent trabeculation, calcified papillary muscles, or   calcified thrombus. TEE would likely not provide sufficient   visualization, recommend cardiac MRI to further evaluate. - Right atrium: The atrium was mildly dilated

## 2014-03-17 NOTE — Assessment & Plan Note (Signed)
Weight loss recommended 

## 2014-03-17 NOTE — Assessment & Plan Note (Signed)
BP has been up. Recommend 2 g sodium diet and followup with Dr. Brigitte Pulse.

## 2014-03-17 NOTE — Assessment & Plan Note (Signed)
Chest pain resolved with PPI. MRI showed no RV mass probable calcification of the apical RV trabeculations. No evidence of tumor or thrombus. Normal LV size and function EF 60%, moderate left atrial enlargement. Followup when necessary.

## 2014-03-17 NOTE — Patient Instructions (Signed)
Your physician recommends that you schedule a follow-up appointment in: only as needed      Please follow 2 Gm sodium diet provided for you     Thank you for choosing [facility] !

## 2014-03-30 ENCOUNTER — Telehealth (INDEPENDENT_AMBULATORY_CARE_PROVIDER_SITE_OTHER): Payer: Self-pay | Admitting: *Deleted

## 2014-03-30 NOTE — Telephone Encounter (Signed)
From Cardiology: it looks like she had been cleared.

## 2014-03-30 NOTE — Telephone Encounter (Signed)
You saw patient on 03/04/14 and requested cardiac clearance for scheduling EGD, she had appt with cardiologist 9/9, patient wants to know of she is cleared to have EGD -- please advise

## 2014-03-31 ENCOUNTER — Other Ambulatory Visit (INDEPENDENT_AMBULATORY_CARE_PROVIDER_SITE_OTHER): Payer: Self-pay | Admitting: *Deleted

## 2014-03-31 ENCOUNTER — Encounter (INDEPENDENT_AMBULATORY_CARE_PROVIDER_SITE_OTHER): Payer: Self-pay | Admitting: *Deleted

## 2014-03-31 DIAGNOSIS — R079 Chest pain, unspecified: Secondary | ICD-10-CM

## 2014-03-31 NOTE — Telephone Encounter (Signed)
Chest pain

## 2014-03-31 NOTE — Telephone Encounter (Signed)
EGD sch'd 05/05/14, patient aware

## 2014-03-31 NOTE — Telephone Encounter (Signed)
What is diagnosis for EGD?

## 2014-05-05 ENCOUNTER — Ambulatory Visit (HOSPITAL_COMMUNITY)
Admission: RE | Admit: 2014-05-05 | Discharge: 2014-05-05 | Disposition: A | Discharge status: Home Health | Payer: Medicare Other | Source: Ambulatory Visit | Attending: Internal Medicine | Admitting: Internal Medicine

## 2014-05-05 ENCOUNTER — Encounter (HOSPITAL_COMMUNITY)
Admission: RE | Disposition: A | Discharge status: Home Health | Payer: Self-pay | Source: Ambulatory Visit | Attending: Internal Medicine

## 2014-05-05 ENCOUNTER — Encounter (HOSPITAL_COMMUNITY): Payer: Self-pay | Admitting: *Deleted

## 2014-05-05 DIAGNOSIS — K449 Diaphragmatic hernia without obstruction or gangrene: Secondary | ICD-10-CM | POA: Insufficient documentation

## 2014-05-05 DIAGNOSIS — I10 Essential (primary) hypertension: Secondary | ICD-10-CM | POA: Insufficient documentation

## 2014-05-05 DIAGNOSIS — Z7982 Long term (current) use of aspirin: Secondary | ICD-10-CM | POA: Diagnosis not present

## 2014-05-05 DIAGNOSIS — Z79899 Other long term (current) drug therapy: Secondary | ICD-10-CM | POA: Diagnosis not present

## 2014-05-05 DIAGNOSIS — K219 Gastro-esophageal reflux disease without esophagitis: Secondary | ICD-10-CM | POA: Diagnosis not present

## 2014-05-05 DIAGNOSIS — R079 Chest pain, unspecified: Secondary | ICD-10-CM | POA: Diagnosis present

## 2014-05-05 DIAGNOSIS — K2950 Unspecified chronic gastritis without bleeding: Secondary | ICD-10-CM | POA: Insufficient documentation

## 2014-05-05 DIAGNOSIS — I252 Old myocardial infarction: Secondary | ICD-10-CM | POA: Diagnosis not present

## 2014-05-05 DIAGNOSIS — Z888 Allergy status to other drugs, medicaments and biological substances status: Secondary | ICD-10-CM | POA: Insufficient documentation

## 2014-05-05 DIAGNOSIS — K3189 Other diseases of stomach and duodenum: Secondary | ICD-10-CM

## 2014-05-05 DIAGNOSIS — R0789 Other chest pain: Secondary | ICD-10-CM

## 2014-05-05 HISTORY — PX: ESOPHAGOGASTRODUODENOSCOPY: SHX5428

## 2014-05-05 HISTORY — DX: Gastro-esophageal reflux disease without esophagitis: K21.9

## 2014-05-05 SURGERY — EGD (ESOPHAGOGASTRODUODENOSCOPY)
Anesthesia: Moderate Sedation

## 2014-05-05 MED ORDER — MIDAZOLAM HCL 5 MG/5ML IJ SOLN
INTRAMUSCULAR | Status: AC
  Filled 2014-05-05: qty 10

## 2014-05-05 MED ORDER — SODIUM CHLORIDE 0.9 % IV SOLN
INTRAVENOUS | Status: DC
  Administered 2014-05-05: 1000 mL via INTRAVENOUS

## 2014-05-05 MED ORDER — BUTAMBEN-TETRACAINE-BENZOCAINE 2-2-14 % EX AERO
INHALATION_SPRAY | CUTANEOUS | Status: DC | PRN
  Administered 2014-05-05: 1 via TOPICAL
  Administered 2014-05-05: 2 via TOPICAL

## 2014-05-05 MED ORDER — MEPERIDINE HCL 50 MG/ML IJ SOLN
INTRAMUSCULAR | Status: DC | PRN
  Administered 2014-05-05 (×2): 25 mg via INTRAVENOUS

## 2014-05-05 MED ORDER — MEPERIDINE HCL 50 MG/ML IJ SOLN
INTRAMUSCULAR | Status: AC
  Filled 2014-05-05: qty 1

## 2014-05-05 MED ORDER — STERILE WATER FOR IRRIGATION IR SOLN
Status: DC | PRN
  Administered 2014-05-05: 15:00:00

## 2014-05-05 MED ORDER — MIDAZOLAM HCL 5 MG/5ML IJ SOLN
INTRAMUSCULAR | Status: DC | PRN
  Administered 2014-05-05 (×4): 2 mg via INTRAVENOUS

## 2014-05-05 NOTE — Op Note (Signed)
EGD PROCEDURE REPORT  PATIENT:  Marissa [name]  MR#:  [id number] Birthdate:  July 24, 1945, 68 y.o., female Endoscopist:  Dr. Rogene Houston, MD Referred By:  Dr. Rayne Du ref. provider found Procedure Date: 05/05/2014  Procedure:   EGD  Indications:  Patient is 68 year old female with non-cardiac chest pain.            Informed Consent:  The risks, benefits, alternatives & imponderables which include, but are not limited to, bleeding, infection, perforation, drug reaction and potential missed lesion have been reviewed.  The potential for biopsy, lesion removal, esophageal dilation, etc. have also been discussed.  Questions have been answered.  All parties agreeable.  Please see history & physical in medical record for more information.  Medications:  Demerol 50 mg IV  Versed 6 mg IV Cetacaine spray topically for oropharyngeal anesthesia  Description of procedure:  The endoscope was introduced through the mouth and advanced to the second portion of the duodenum without difficulty or limitations. The mucosal surfaces were surveyed very carefully during advancement of the scope and upon withdrawal.  Findings:  Esophagus: Mucosa of the esophagus was normal. GE junction was wavy otherwise unremarkable. GEJ:  34 cm Hiatus:  36 cm Stomach:  Stomach was emptied and distended very well with insufflation. Folds in the proximal stomach were normal. Examination mucosa gastric body was normal. There was fine nodularity to antral mucosa with  few prepyloric erosions. A causal lesion noted at antrum along the posterior wall about 15 mm in maximal diameter. Pyloric channel was patent. Angularis fundus and cardia was unremarkable. Duodenum:  Normal bulbar and post bulbar mucosa.  Therapeutic/Diagnostic Maneuvers Performed:  Antral biopsy taken for routine histology.  Complications:  None  Impression: Small sliding hiatal hernia without changes of reflux esophagitis. Erosive antral gastritis. Antral  biopsy taken. Submucosal lesion at antrum about 15 mm in maximal diameter. It will need to be further evaluated.  Recommendations:  Continue anti-reflux measures and omeprazole as before. Await results of biopsy. Patient will need endoscopic ultrasound to further evaluate antral submucosal lesion.   Vashawn Ekstein U  05/05/2014  3:33 PM  CC: Dr. Monico Blitz, MD & Dr. Rayne Du ref. provider found

## 2014-05-05 NOTE — H&P (Addendum)
Marissa [name] is an 68 y.o. female.   Chief Complaint: Patient is here for EGD. HPI: Patient is 68 year old Caucasian female was admitted to this facility with chest pain back in August 2015 for myocardial infarction. She was begun on PPI. She is feeling much better. She still has intermittent pain. She denies nausea vomiting dysphagia or heartburn. She also denies epigastric pain or melena. She is undergoing diagnostic EGD.  Past Medical History  Diagnosis Date  . Hypertension   . High cholesterol   . GERD (gastroesophageal reflux disease)     Past Surgical History  Procedure Laterality Date  . Tubal ligation    . Cardiac catheterization      2008    History reviewed. No pertinent family history. Social History:  reports that she has never smoked. She has never used smokeless tobacco. She reports that she does not drink alcohol or use illicit drugs.  Allergies:  Allergies  Allergen Reactions  . Nitrostat [Nitroglycerin] Shortness Of Breath    Medications Prior to Admission  Medication Sig Dispense Refill  . amLODipine (NORVASC) 10 MG tablet Take 10 mg by mouth daily.      Marland Kitchen losartan-hydrochlorothiazide (HYZAAR) 100-25 MG per tablet Take 1 tablet by mouth daily.      . metoprolol succinate (TOPROL-XL) 25 MG 24 hr tablet Take 25 mg by mouth daily.      Marland Kitchen omeprazole (PRILOSEC OTC) 20 MG tablet Take 20 mg by mouth daily.      Marland Kitchen aspirin EC 81 MG tablet Take 81 mg by mouth daily.        No results found for this or any previous visit (from the past 48 hour(s)). No results found.  ROS  Blood pressure 134/70, pulse 66, temperature 97.8 F (36.6 C), temperature source Oral, resp. rate 11, height 5\' 5"  (1.651 m), weight 230 lb (104.327 kg), SpO2 97.00%. Physical Exam  Constitutional: She appears well-developed and well-nourished.  HENT:  Mouth/Throat: Oropharynx is clear and moist.  Eyes: Conjunctivae are normal. No scleral icterus.  Neck: No thyromegaly present.   Cardiovascular: Normal rate, regular rhythm and normal heart sounds.   No murmur heard. Respiratory: Effort normal and breath sounds normal.  Mild midsternal tenderness  GI: Soft. She exhibits no distension and no mass. There is no tenderness.  Musculoskeletal: She exhibits no edema.  Lymphadenopathy:    She has no cervical adenopathy.  Neurological: She is alert.  Skin: Skin is warm.     Assessment/Plan Noncardiac chest pain. Diagnostic EGD.  Marissa [name] 05/05/2014, 3:06 PM

## 2014-05-05 NOTE — Discharge Instructions (Addendum)
Resume usual medications and diet. No driving for 24 hours. Gastrointestinal Endoscopy, Care After Refer to this sheet in the next few weeks. These instructions provide you with information on caring for yourself after your procedure. Your caregiver may also give you more specific instructions. Your treatment has been planned according to current medical practices, but problems sometimes occur. Call your caregiver if you have any problems or questions after your procedure. HOME CARE INSTRUCTIONS  If you were given medicine to help you relax (sedative), do not drive, operate machinery, or sign important documents for 24 hours.  Avoid alcohol and hot or warm beverages for the first 24 hours after the procedure.  Only take over-the-counter or prescription medicines for pain, discomfort, or fever as directed by your caregiver. You may resume taking your normal medicines unless your caregiver tells you otherwise. Ask your caregiver when you may resume taking medicines that may cause bleeding, such as aspirin, clopidogrel, or warfarin.  You may return to your normal diet and activities on the day after your procedure, or as directed by your caregiver. Walking may help to reduce any bloated feeling in your abdomen.  Drink enough fluids to keep your urine clear or pale yellow.  You may gargle with salt water if you have a sore throat. SEEK IMMEDIATE MEDICAL CARE IF:  You have severe nausea or vomiting.  You have severe abdominal pain, abdominal cramps that last longer than 6 hours, or abdominal swelling (distention).  You have severe shoulder or back pain.  You have trouble swallowing.  You have shortness of breath, your breathing is shallow, or you are breathing faster than normal.  You have a fever or a rapid heartbeat.  You vomit blood or material that looks like coffee grounds.  You have bloody, black, or tarry stools. MAKE SURE YOU:  Understand these instructions.  Will watch your  condition.  Will get help right away if you are not doing well or get worse.

## 2014-05-07 ENCOUNTER — Encounter (HOSPITAL_COMMUNITY): Payer: Self-pay | Admitting: Internal Medicine

## 2014-05-10 ENCOUNTER — Telehealth: Payer: Self-pay

## 2014-05-10 DIAGNOSIS — K3189 Other diseases of stomach and duodenum: Secondary | ICD-10-CM

## 2014-05-10 NOTE — Telephone Encounter (Signed)
-----   Message from Milus Banister, MD sent at 05/10/2014 10:31 AM EST ----- Regarding: RE: EUS she needs upper EUS, radial +/- linear, +MAC, for stomach mass (submucosal lesion), noted by Dr. Laural Golden. Next avail EUS Thursday (not during hosp week).  Thanks  ----- Message -----    From: Barron Alvine, CMA    Sent: 05/10/2014   9:19 AM      To: Milus Banister, MD Subject: Melton Alar: EUS                                          ----- Message -----    From: Worthy Keeler    Sent: 05/10/2014   9:11 AM      To: Barron Alvine, CMA Subject: EUS                                            Patient needs EUS for submucosal lesion at antrum about 15 mm in maximal diameter -- all info in EPIC

## 2014-05-11 ENCOUNTER — Other Ambulatory Visit: Payer: Self-pay

## 2014-05-11 DIAGNOSIS — K3189 Other diseases of stomach and duodenum: Secondary | ICD-10-CM

## 2014-05-11 NOTE — Telephone Encounter (Signed)
EUS scheduled for 06/10/14 830 need to put in instructions and call pt

## 2014-05-13 NOTE — Telephone Encounter (Signed)
Instructions mailed.

## 2014-05-13 NOTE — Telephone Encounter (Signed)
Pt aware need to mail instructions

## 2014-05-25 ENCOUNTER — Encounter (HOSPITAL_COMMUNITY): Payer: Self-pay | Admitting: *Deleted

## 2014-06-10 ENCOUNTER — Encounter (HOSPITAL_COMMUNITY)
Admission: RE | Disposition: A | Discharge status: Home / Self Care | Payer: Self-pay | Source: Ambulatory Visit | Attending: Gastroenterology

## 2014-06-10 ENCOUNTER — Ambulatory Visit (HOSPITAL_COMMUNITY)
Admission: RE | Admit: 2014-06-10 | Discharge: 2014-06-10 | Disposition: A | Discharge status: Home / Self Care | Payer: Medicare Other | Source: Ambulatory Visit | Attending: Gastroenterology | Admitting: Gastroenterology

## 2014-06-10 ENCOUNTER — Ambulatory Visit (HOSPITAL_COMMUNITY): Payer: Medicare Other | Admitting: Anesthesiology

## 2014-06-10 ENCOUNTER — Encounter (HOSPITAL_COMMUNITY): Payer: Self-pay

## 2014-06-10 DIAGNOSIS — Z888 Allergy status to other drugs, medicaments and biological substances status: Secondary | ICD-10-CM | POA: Diagnosis not present

## 2014-06-10 DIAGNOSIS — I10 Essential (primary) hypertension: Secondary | ICD-10-CM | POA: Insufficient documentation

## 2014-06-10 DIAGNOSIS — Z6837 Body mass index (BMI) 37.0-37.9, adult: Secondary | ICD-10-CM | POA: Insufficient documentation

## 2014-06-10 DIAGNOSIS — E780 Pure hypercholesterolemia: Secondary | ICD-10-CM | POA: Diagnosis not present

## 2014-06-10 DIAGNOSIS — K3189 Other diseases of stomach and duodenum: Secondary | ICD-10-CM | POA: Diagnosis not present

## 2014-06-10 DIAGNOSIS — K219 Gastro-esophageal reflux disease without esophagitis: Secondary | ICD-10-CM | POA: Diagnosis not present

## 2014-06-10 DIAGNOSIS — K319 Disease of stomach and duodenum, unspecified: Secondary | ICD-10-CM

## 2014-06-10 HISTORY — PX: EUS: SHX5427

## 2014-06-10 SURGERY — UPPER ENDOSCOPIC ULTRASOUND (EUS) LINEAR
Anesthesia: Monitor Anesthesia Care

## 2014-06-10 MED ORDER — MIDAZOLAM HCL 2 MG/2ML IJ SOLN
INTRAMUSCULAR | Status: AC
  Filled 2014-06-10: qty 2

## 2014-06-10 MED ORDER — PROPOFOL 10 MG/ML IV BOLUS
INTRAVENOUS | Status: AC
  Filled 2014-06-10: qty 20

## 2014-06-10 MED ORDER — PROPOFOL 10 MG/ML IV BOLUS
INTRAVENOUS | Status: DC | PRN
  Administered 2014-06-10 (×2): 40 mg via INTRAVENOUS

## 2014-06-10 MED ORDER — PROPOFOL INFUSION 10 MG/ML OPTIME
INTRAVENOUS | Status: DC | PRN
  Administered 2014-06-10: 100 ug/kg/min via INTRAVENOUS

## 2014-06-10 MED ORDER — MIDAZOLAM HCL 5 MG/5ML IJ SOLN
INTRAMUSCULAR | Status: DC | PRN
  Administered 2014-06-10: 2 mg via INTRAVENOUS

## 2014-06-10 MED ORDER — SODIUM CHLORIDE 0.9 % IV SOLN: INTRAVENOUS | Status: DC

## 2014-06-10 MED ORDER — LACTATED RINGERS IV SOLN
INTRAVENOUS | Status: DC
  Administered 2014-06-10: 1000 mL via INTRAVENOUS

## 2014-06-10 MED ORDER — FENTANYL CITRATE 0.05 MG/ML IJ SOLN: 25.0000 ug | INTRAMUSCULAR | Status: DC | PRN

## 2014-06-10 NOTE — Op Note (Signed)
[facility] [place], [address]   ENDOSCOPIC ULTRASOUND PROCEDURE REPORT  PATIENT: Marissa [name], Marissa [name]  MR#: [id number] BIRTHDATE: [birth year]  GENDER: female ENDOSCOPIST: Milus Banister, MD REFERRED BY:  Hildred Laser, M.D. PROCEDURE DATE:  06/10/2014 PROCEDURE:   Upper EUS w/FNA ASA CLASS:      Class II INDICATIONS:   1.  recently noted gastric subepithelial lesion (Dr. Laural Golden EGD done for chest pain). MEDICATIONS: Monitored anesthesia care  DESCRIPTION OF PROCEDURE:   After the risks benefits and alternatives of the procedure were  explained, informed consent was obtained. The patient was then placed in the left, lateral, decubitus postion and IV sedation was administered. Throughout the procedure, the patients blood pressure, pulse and oxygen saturations were monitored continuously.  Under direct visualization, the Pentax EUS Radial [id number]  endoscope was introduced through the mouth  and advanced to the second portion of the duodenum .  Water was used as necessary to provide an acoustic interface.  Upon completion of the imaging, water was removed and the patient was sent to the recovery room in satisfactory condition.   Endoscopic findings (limited views with radial and linear echoendoscopes): 1. Gastric antrum subepithelial lesion, vaguely bordered, approximately 1-2cm across, normal overlying mucosa.  EUS findings: 1. The gastric antral subepthelial lesion noted above correlated with a vaguely bordered thickening in the muscularis layer of the gastric wall. This was vaguely bordered but fairly focal (MP layer was 22mm thick at most).  This thickened MP layer was sampled with a single pass with a 25 gauge EUS FNA needle, suction. 2. No perigatric adenopathy 3. Limited views of pancreas, liver, spleen, extrahepatic bile ducts were all normal.  ENDOSCOPIC IMPRESSION: Fairly focal, but vaguely bordered thickening of the  muscularis propria layer in the gastric antrum.  This was sampled with EUS FNA, await final cytology results.  Overall I think this is unlikely to be neoplastic and almost certainly is not causing her chest pains.  She had copious secretions throughout the examination and was diffult to sedate with MAC sedation, if she requires future endoscopic procedures I recommend general anesthesia.  RECOMMENDATIONS: Await final cytology.  _______________________________ eSignedMilus Banister, MD 06/10/2014 9:04 AM

## 2014-06-10 NOTE — H&P (Signed)
  HPI: This is a 68 yo woman noted to have antral subepithelial lesion on recent EGD, Dr. Laural Golden, done for chest pain    Past Medical History  Diagnosis Date  . Hypertension   . High cholesterol   . GERD (gastroesophageal reflux disease)     Past Surgical History  Procedure Laterality Date  . Tubal ligation    . Esophagogastroduodenoscopy N/A 05/05/2014    Procedure: ESOPHAGOGASTRODUODENOSCOPY (EGD);  Surgeon: Rogene Houston, MD;  Location: [facility];  Service: Endoscopy;  Laterality: N/A;  255  . Cardiac catheterization      2008- negative findings    Current Facility-Administered Medications  Medication Dose Route Frequency Provider Last Rate Last Dose  . 0.9 %  sodium chloride infusion   Intravenous Continuous Milus Banister, MD      . fentaNYL (SUBLIMAZE) injection 25-50 mcg  25-50 mcg Intravenous Q5 min PRN Peyton Najjar, MD      . lactated ringers infusion   Intravenous Continuous Peyton Najjar, MD 125 mL/hr at 06/10/14 0801 1,000 mL at 06/10/14 0801    Allergies as of 05/11/2014 - Review Complete 05/05/2014  Allergen Reaction Noted  . Nitrostat [nitroglycerin] Shortness Of Breath 02/15/2014    History reviewed. No pertinent family history.  History   Social History  . Marital Status: Married    Spouse Name: N/A    Number of Children: N/A  . Years of Education: N/A   Occupational History  . Not on file.   Social History Main Topics  . Smoking status: Never Smoker   . Smokeless tobacco: Never Used  . Alcohol Use: No  . Drug Use: No  . Sexual Activity: Yes   Other Topics Concern  . Not on file   Social History Narrative      Physical Exam: BP 141/65 mmHg  Pulse 81  Temp(Src) 97.8 F (36.6 C) (Oral)  Resp 16  Ht 5\' 5"  (1.651 m)  Wt 227 lb (102.967 kg)  BMI 37.77 kg/m2  SpO2 99% Constitutional: generally well-appearing Psychiatric: alert and oriented x3 Abdomen: soft, nontender, nondistended, no obvious ascites, no peritoneal signs,  normal bowel sounds     Assessment and plan: 68 y.o. female with gastric antral mass  For EUS evaluation today

## 2014-06-10 NOTE — Anesthesia Preprocedure Evaluation (Addendum)
Anesthesia Evaluation  Patient identified by MRN, date of birth, ID band Patient awake    Reviewed: Allergy & Precautions, H&P , NPO status , Patient's Chart, lab work & pertinent test results, reviewed documented beta blocker date and time   Airway Mallampati: II  TM Distance: >3 FB Neck ROM: full    Dental no notable dental hx. (+) Teeth Intact, Dental Advisory Given   Pulmonary neg pulmonary ROS,  breath sounds clear to auscultation  Pulmonary exam normal       Cardiovascular Exercise Tolerance: Good hypertension, Pt. on medications and Pt. on home beta blockers Rhythm:regular Rate:Normal     Neuro/Psych negative neurological ROS  negative psych ROS   GI/Hepatic negative GI ROS, Neg liver ROS, GERD-  Medicated and Controlled,  Endo/Other  negative endocrine ROSMorbid obesity  Renal/GU negative Renal ROS  negative genitourinary   Musculoskeletal   Abdominal (+) + obese,   Peds  Hematology negative hematology ROS (+)   Anesthesia Other Findings   Reproductive/Obstetrics negative OB ROS                           Anesthesia Physical Anesthesia Plan  ASA: III  Anesthesia Plan: MAC   Post-op Pain Management:    Induction:   Airway Management Planned:   Additional Equipment:   Intra-op Plan:   Post-operative Plan:   Informed Consent: I have reviewed the patients History and Physical, chart, labs and discussed the procedure including the risks, benefits and alternatives for the proposed anesthesia with the patient or authorized representative who has indicated his/her understanding and acceptance.   Dental Advisory Given  Plan Discussed with: CRNA and Surgeon  Anesthesia Plan Comments:        Anesthesia Quick Evaluation

## 2014-06-10 NOTE — Transfer of Care (Signed)
Immediate Anesthesia Transfer of Care Note  Patient: Marissa [name]  Procedure(s) Performed: Procedure(s) (LRB): UPPER ENDOSCOPIC ULTRASOUND (EUS) LINEAR (N/A)  Patient Location: PACU  Anesthesia Type: MAC  Level of Consciousness: sedated, patient cooperative and responds to stimulation  Airway & Oxygen Therapy: Patient Spontanous Breathing and Patient connected to face mask oxgen  Post-op Assessment: Report given to PACU RN and Post -op Vital signs reviewed and stable  Post vital signs: Reviewed and stable  Complications: No apparent anesthesia complications

## 2014-06-10 NOTE — Anesthesia Postprocedure Evaluation (Signed)
  Anesthesia Post-op Note  Patient: Marissa [name]  Procedure(s) Performed: Procedure(s) (LRB): UPPER ENDOSCOPIC ULTRASOUND (EUS) LINEAR (N/A)  Patient Location: PACU  Anesthesia Type: MAC  Level of Consciousness: awake and alert   Airway and Oxygen Therapy: Patient Spontanous Breathing  Post-op Pain: mild  Post-op Assessment: Post-op Vital signs reviewed, Patient's Cardiovascular Status Stable, Respiratory Function Stable, Patent Airway and No signs of Nausea or vomiting  Last Vitals:  Filed Vitals:   06/10/14 0930  BP: 118/68  Pulse: 58  Temp:   Resp: 16    Post-op Vital Signs: stable   Complications: No apparent anesthesia complications

## 2014-06-10 NOTE — Discharge Instructions (Signed)
YOU HAD AN ENDOSCOPIC PROCEDURE TODAY: Refer to the procedure report that was given to you for any specific questions about what was found during the examination.  If the procedure report does not answer your questions, please call your gastroenterologist to clarify. ° °YOU SHOULD EXPECT: Some feelings of bloating in the abdomen. Passage of more gas than usual.  Walking can help get rid of the air that was put into your GI tract during the procedure and reduce the bloating. If you had a lower endoscopy (such as a colonoscopy or flexible sigmoidoscopy) you may notice spotting of blood in your stool or on the toilet paper.  ° °DIET: Your first meal following the procedure should be a light meal and then it is ok to progress to your normal diet.  A half-sandwich or bowl of soup is an example of a good first meal.  Heavy or fried foods are harder to digest and may make you feel nasueas or bloated.  Drink plenty of fluids but you should avoid alcoholic beverages for 24 hours. ° °ACTIVITY: Your care partner should take you home directly after the procedure.  You should plan to take it easy, moving slowly for the rest of the day.  You can resume normal activity the day after the procedure however you should NOT DRIVE or use heavy machinery for 24 hours (because of the sedation medicines used during the test).   ° °SYMPTOMS TO REPORT IMMEDIATELY  °A gastroenterologist can be reached at any hour.  Please call your doctor's office for any of the following symptoms: ° °· Following lower endoscopy (colonoscopy, flexible sigmoidoscopy) ° Excessive amounts of blood in the stool ° Significant tenderness, worsening of abdominal pains ° Swelling of the abdomen that is new, acute ° Fever of 100° or higher °· Following upper endoscopy (EGD, EUS, ERCP) ° Vomiting of blood or coffee ground material ° New, significant abdominal pain ° New, significant chest pain or pain under the shoulder blades ° Painful or persistently difficult  swallowing ° New shortness of breath ° Black, tarry-looking stools ° °FOLLOW UP: °If any biopsies were taken you will be contacted by phone or by letter within the next 1-3 weeks.  Call your gastroenterologist if you have not heard about the biopsies in 3 weeks.  °Please also call your gastroenterologist's office with any specific questions about appointments or follow up tests. ° °Conscious Sedation, Adult, Care After °Refer to this sheet in the next few weeks. These instructions provide you with information on caring for yourself after your procedure. Your health care provider may also give you more specific instructions. Your treatment has been planned according to current medical practices, but problems sometimes occur. Call your health care provider if you have any problems or questions after your procedure. °WHAT TO EXPECT AFTER THE PROCEDURE  °After your procedure: °· You may feel sleepy, clumsy, and have poor balance for several hours. °· Vomiting may occur if you eat too soon after the procedure. °HOME CARE INSTRUCTIONS °· Do not participate in any activities where you could become injured for at least 24 hours. Do not: °¨ Drive. °¨ Swim. °¨ Ride a bicycle. °¨ Operate heavy machinery. °¨ Cook. °¨ Use power tools. °¨ Climb ladders. °¨ Work from a high place. °· Do not make important decisions or sign legal documents until you are improved. °· If you vomit, drink water, juice, or soup when you can drink without vomiting. Make sure you have little or no nausea before eating   solid foods. °· Only take over-the-counter or prescription medicines for pain, discomfort, or fever as directed by your health care provider. °· Make sure you and your family fully understand everything about the medicines given to you, including what side effects may occur. °· You should not drink alcohol, take sleeping pills, or take medicines that cause drowsiness for at least 24 hours. °· If you smoke, do not smoke without  supervision. °· If you are feeling better, you may resume normal activities 24 hours after you were sedated. °· Keep all appointments with your health care provider. °SEEK MEDICAL CARE IF: °· Your skin is pale or bluish in color. °· You continue to feel nauseous or vomit. °· Your pain is getting worse and is not helped by medicine. °· You have bleeding or swelling. °· You are still sleepy or feeling clumsy after 24 hours. °SEEK IMMEDIATE MEDICAL CARE IF: °· You develop a rash. °· You have difficulty breathing. °· You develop any type of allergic problem. °· You have a fever. °MAKE SURE YOU: °· Understand these instructions. °· Will watch your condition. °· Will get help right away if you are not doing well or get worse. °Document Released: 04/15/2013 Document Reviewed: 04/15/2013 °[employer]® Patient Information ©2015 [employer]. This information is not intended to replace advice given to you by your health care provider. Make sure you discuss any questions you have with your health care provider. ° °

## 2014-06-11 ENCOUNTER — Encounter (HOSPITAL_COMMUNITY): Payer: Self-pay | Admitting: Gastroenterology

## 2014-08-30 NOTE — Patient Instructions (Signed)
Your procedure is scheduled on: 09/06/2014  Report to [facility] at  52  AM.  Call this number if you have problems the morning of surgery: [phone]   Do not eat food or drink liquids :After Midnight.      Take these medicines the morning of surgery with A SIP OF WATER: amlodipine, losartan, metoprolol   Do not wear jewelry, make-up or nail polish.  Do not wear lotions, powders, or perfumes.   Do not shave 48 hours prior to surgery.  Do not bring valuables to the hospital.  Contacts, dentures or bridgework may not be worn into surgery.  Leave suitcase in the car. After surgery it may be brought to your room.  For patients admitted to the hospital, checkout time is 11:00 AM the day of discharge.   Patients discharged the day of surgery will not be allowed to drive home.  :     Please read over the following fact sheets that you were given: Coughing and Deep Breathing, Surgical Site Infection Prevention, Anesthesia Post-op Instructions and Care and Recovery After Surgery    Cataract A cataract is a clouding of the lens of the eye. When a lens becomes cloudy, vision is reduced based on the degree and nature of the clouding. Many cataracts reduce vision to some degree. Some cataracts make people more near-sighted as they develop. Other cataracts increase glare. Cataracts that are ignored and become worse can sometimes look white. The white color can be seen through the pupil. CAUSES   Aging. However, cataracts may occur at any age, even in newborns.   Certain drugs.   Trauma to the eye.   Certain diseases such as diabetes.   Specific eye diseases such as chronic inflammation inside the eye or a sudden attack of a rare form of glaucoma.   Inherited or acquired medical problems.  SYMPTOMS   Gradual, progressive drop in vision in the affected eye.   Severe, rapid visual loss. This most often happens when trauma is the cause.  DIAGNOSIS  To detect a cataract, an eye doctor  examines the lens. Cataracts are best diagnosed with an exam of the eyes with the pupils enlarged (dilated) by drops.  TREATMENT  For an early cataract, vision may improve by using different eyeglasses or stronger lighting. If that does not help your vision, surgery is the only effective treatment. A cataract needs to be surgically removed when vision loss interferes with your everyday activities, such as driving, reading, or watching TV. A cataract may also have to be removed if it prevents examination or treatment of another eye problem. Surgery removes the cloudy lens and usually replaces it with a substitute lens (intraocular lens, IOL).  At a time when both you and your doctor agree, the cataract will be surgically removed. If you have cataracts in both eyes, only one is usually removed at a time. This allows the operated eye to heal and be out of danger from any possible problems after surgery (such as infection or poor wound healing). In rare cases, a cataract may be doing damage to your eye. In these cases, your caregiver may advise surgical removal right away. The vast majority of people who have cataract surgery have better vision afterward. HOME CARE INSTRUCTIONS  If you are not planning surgery, you may be asked to do the following:  Use different eyeglasses.   Use stronger or brighter lighting.   Ask your eye doctor about reducing your medicine dose or changing  medicines if it is thought that a medicine caused your cataract. Changing medicines does not make the cataract go away on its own.   Become familiar with your surroundings. Poor vision can lead to injury. Avoid bumping into things on the affected side. You are at a higher risk for tripping or falling.   Exercise extreme care when driving or operating machinery.   Wear sunglasses if you are sensitive to bright light or experiencing problems with glare.  SEEK IMMEDIATE MEDICAL CARE IF:   You have a worsening or sudden vision  loss.   You notice redness, swelling, or increasing pain in the eye.   You have a fever.  Document Released: 06/25/2005 Document Revised: 06/14/2011 Document Reviewed: 02/16/2011 [facility] Patient Information 2012 [place].PATIENT INSTRUCTIONS POST-ANESTHESIA  IMMEDIATELY FOLLOWING SURGERY:  Do not drive or operate machinery for the first twenty four hours after surgery.  Do not make any important decisions for twenty four hours after surgery or while taking narcotic pain medications or sedatives.  If you develop intractable nausea and vomiting or a severe headache please notify your doctor immediately.  FOLLOW-UP:  Please make an appointment with your surgeon as instructed. You do not need to follow up with anesthesia unless specifically instructed to do so.  WOUND CARE INSTRUCTIONS (if applicable):  Keep a dry clean dressing on the anesthesia/puncture wound site if there is drainage.  Once the wound has quit draining you may leave it open to air.  Generally you should leave the bandage intact for twenty four hours unless there is drainage.  If the epidural site drains for more than 36-48 hours please call the anesthesia department.  QUESTIONS?:  Please feel free to call your physician or the hospital operator if you have any questions, and they will be happy to assist you.

## 2014-08-31 ENCOUNTER — Encounter (HOSPITAL_COMMUNITY): Payer: Self-pay

## 2014-08-31 ENCOUNTER — Encounter (HOSPITAL_COMMUNITY)
Admission: RE | Admit: 2014-08-31 | Discharge: 2014-08-31 | Disposition: A | Discharge status: Home / Self Care | Payer: Medicare Other | Source: Ambulatory Visit | Attending: Ophthalmology | Admitting: Ophthalmology

## 2014-08-31 DIAGNOSIS — Z01818 Encounter for other preprocedural examination: Secondary | ICD-10-CM | POA: Insufficient documentation

## 2014-08-31 DIAGNOSIS — H2511 Age-related nuclear cataract, right eye: Secondary | ICD-10-CM | POA: Diagnosis not present

## 2014-08-31 LAB — CBC
HEMATOCRIT: 41.5 % (ref 36.0–46.0)
Hemoglobin: 13.8 g/dL (ref 12.0–15.0)
MCH: 30.1 pg (ref 26.0–34.0)
MCHC: 33.3 g/dL (ref 30.0–36.0)
MCV: 90.6 fL (ref 78.0–100.0)
Platelets: 227 10*3/uL (ref 150–400)
RBC: 4.58 MIL/uL (ref 3.87–5.11)
RDW: 13.4 % (ref 11.5–15.5)
WBC: 6.2 10*3/uL (ref 4.0–10.5)

## 2014-08-31 LAB — BASIC METABOLIC PANEL
Anion gap: 6 (ref 5–15)
BUN: 26 mg/dL — ABNORMAL HIGH (ref 6–23)
CALCIUM: 10.1 mg/dL (ref 8.4–10.5)
CO2: 27 mmol/L (ref 19–32)
CREATININE: 1.05 mg/dL (ref 0.50–1.10)
Chloride: 103 mmol/L (ref 96–112)
GFR calc non Af Amer: 53 mL/min — ABNORMAL LOW (ref >90)
GFR, EST AFRICAN AMERICAN: 62 mL/min — AB (ref >90)
Glucose, Bld: 85 mg/dL (ref 70–99)
Potassium: 3.8 mmol/L (ref 3.5–5.1)
Sodium: 136 mmol/L (ref 135–145)

## 2014-08-31 NOTE — Pre-Procedure Instructions (Signed)
Patient given information to sign up for my chart at home. 

## 2014-09-03 MED ORDER — TETRACAINE HCL 0.5 % OP SOLN
OPHTHALMIC | Status: AC
  Filled 2014-09-03: qty 2

## 2014-09-03 MED ORDER — LIDOCAINE HCL 3.5 % OP GEL
OPHTHALMIC | Status: AC
  Filled 2014-09-03: qty 1

## 2014-09-03 MED ORDER — CYCLOPENTOLATE-PHENYLEPHRINE OP SOLN OPTIME - NO CHARGE
OPHTHALMIC | Status: AC
  Filled 2014-09-03: qty 2

## 2014-09-06 ENCOUNTER — Ambulatory Visit (HOSPITAL_COMMUNITY)
Admission: RE | Admit: 2014-09-06 | Discharge: 2014-09-06 | Disposition: A | Discharge status: Home / Self Care | Payer: Medicare Other | Source: Ambulatory Visit | Attending: Ophthalmology | Admitting: Ophthalmology

## 2014-09-06 ENCOUNTER — Ambulatory Visit (HOSPITAL_COMMUNITY): Payer: Medicare Other | Admitting: Anesthesiology

## 2014-09-06 ENCOUNTER — Encounter (HOSPITAL_COMMUNITY): Payer: Self-pay

## 2014-09-06 ENCOUNTER — Encounter (HOSPITAL_COMMUNITY)
Admission: RE | Disposition: A | Discharge status: Home / Self Care | Payer: Self-pay | Source: Ambulatory Visit | Attending: Ophthalmology

## 2014-09-06 DIAGNOSIS — H2511 Age-related nuclear cataract, right eye: Secondary | ICD-10-CM | POA: Insufficient documentation

## 2014-09-06 HISTORY — PX: CATARACT EXTRACTION W/PHACO: SHX586

## 2014-09-06 SURGERY — PHACOEMULSIFICATION, CATARACT, WITH IOL INSERTION
Anesthesia: Monitor Anesthesia Care | Site: Eye | Laterality: Right

## 2014-09-06 MED ORDER — CYCLOPENTOLATE-PHENYLEPHRINE 0.2-1 % OP SOLN
1.0000 [drp] | OPHTHALMIC | Status: AC
Start: 2014-09-06 — End: 2014-09-06
  Administered 2014-09-06 (×3): 1 [drp] via OPHTHALMIC

## 2014-09-06 MED ORDER — TETRACAINE HCL 0.5 % OP SOLN
1.0000 [drp] | OPHTHALMIC | Status: AC
  Administered 2014-09-06 (×3): 1 [drp] via OPHTHALMIC

## 2014-09-06 MED ORDER — MIDAZOLAM HCL 2 MG/2ML IJ SOLN
INTRAMUSCULAR | Status: AC
  Filled 2014-09-06: qty 2

## 2014-09-06 MED ORDER — POVIDONE-IODINE 5 % OP SOLN
OPHTHALMIC | Status: DC | PRN
  Administered 2014-09-06: 1 via OPHTHALMIC

## 2014-09-06 MED ORDER — LACTATED RINGERS IV SOLN
INTRAVENOUS | Status: DC
  Administered 2014-09-06 (×2): via INTRAVENOUS

## 2014-09-06 MED ORDER — BSS IO SOLN
INTRAOCULAR | Status: DC | PRN
  Administered 2014-09-06: 15 mL via INTRAOCULAR

## 2014-09-06 MED ORDER — EPINEPHRINE HCL 1 MG/ML IJ SOLN
INTRAMUSCULAR | Status: AC
  Filled 2014-09-06: qty 1

## 2014-09-06 MED ORDER — NA HYALUR & NA CHOND-NA HYALUR 0.55-0.5 ML IO KIT
PACK | INTRAOCULAR | Status: DC | PRN
  Administered 2014-09-06: 1 via OPHTHALMIC

## 2014-09-06 MED ORDER — EPINEPHRINE HCL 1 MG/ML IJ SOLN
INTRAOCULAR | Status: DC | PRN
  Administered 2014-09-06: 500 mL

## 2014-09-06 MED ORDER — LIDOCAINE HCL 3.5 % OP GEL
OPHTHALMIC | Status: DC | PRN
Start: 2014-09-06 — End: 2014-09-06
  Administered 2014-09-06: 1 via OPHTHALMIC

## 2014-09-06 MED ORDER — TETRACAINE 0.5 % OP SOLN OPTIME - NO CHARGE
OPHTHALMIC | Status: DC | PRN
  Administered 2014-09-06: 1 [drp] via OPHTHALMIC

## 2014-09-06 MED ORDER — FENTANYL CITRATE 0.05 MG/ML IJ SOLN
INTRAMUSCULAR | Status: AC
Start: 2014-09-06 — End: 2014-09-06
  Filled 2014-09-06: qty 2

## 2014-09-06 MED ORDER — FENTANYL CITRATE 0.05 MG/ML IJ SOLN
25.0000 ug | INTRAMUSCULAR | Status: AC
  Administered 2014-09-06 (×2): 25 ug via INTRAVENOUS

## 2014-09-06 MED ORDER — MIDAZOLAM HCL 2 MG/2ML IJ SOLN
1.0000 mg | INTRAMUSCULAR | Status: DC | PRN
  Administered 2014-09-06: 2 mg via INTRAVENOUS

## 2014-09-06 SURGICAL SUPPLY — 28 items
CAPSULAR TENSION RING-AMO (OPHTHALMIC RELATED) IMPLANT
CLOTH BEACON ORANGE TIMEOUT ST (SAFETY) ×2 IMPLANT
GLOVE BIO SURGEON STRL SZ7.5 (GLOVE) IMPLANT
GLOVE BIOGEL M 6.5 STRL (GLOVE) IMPLANT
GLOVE BIOGEL PI IND STRL 6.5 (GLOVE) IMPLANT
GLOVE BIOGEL PI IND STRL 7.0 (GLOVE) IMPLANT
GLOVE BIOGEL PI INDICATOR 6.5 (GLOVE)
GLOVE BIOGEL PI INDICATOR 7.0 (GLOVE) ×2
GLOVE ECLIPSE 6.5 STRL STRAW (GLOVE) IMPLANT
GLOVE ECLIPSE 7.5 STRL STRAW (GLOVE) IMPLANT
GLOVE EXAM NITRILE LRG STRL (GLOVE) IMPLANT
GLOVE EXAM NITRILE MD LF STRL (GLOVE) ×2 IMPLANT
GLOVE SKINSENSE NS SZ6.5 (GLOVE)
GLOVE SKINSENSE NS SZ7.0 (GLOVE)
GLOVE SKINSENSE STRL SZ6.5 (GLOVE) IMPLANT
GLOVE SKINSENSE STRL SZ7.0 (GLOVE) IMPLANT
INST SET CATARACT ~~LOC~~ (KITS) ×3 IMPLANT
KIT VITRECTOMY (OPHTHALMIC RELATED) IMPLANT
LENS ALC ACRYL/TECN (Ophthalmic Related) ×3 IMPLANT
PAD ARMBOARD 7.5X6 YLW CONV (MISCELLANEOUS) ×2 IMPLANT
PROC W NO LENS (INTRAOCULAR LENS)
PROC W SPEC LENS (INTRAOCULAR LENS)
PROCESS W NO LENS (INTRAOCULAR LENS) IMPLANT
PROCESS W SPEC LENS (INTRAOCULAR LENS) IMPLANT
RETRACTOR IRIS SIGHTPATH (OPHTHALMIC RELATED) IMPLANT
RING MALYGIN (MISCELLANEOUS) IMPLANT
VISCOELASTIC ADDITIONAL (OPHTHALMIC RELATED) IMPLANT
WATER STERILE IRR 250ML POUR (IV SOLUTION) ×2 IMPLANT

## 2014-09-06 NOTE — Anesthesia Postprocedure Evaluation (Signed)
  Anesthesia Post-op Note  Patient: Marissa [name]  Procedure(s) Performed: Procedure(s): CATARACT EXTRACTION PHACO AND INTRAOCULAR LENS PLACEMENT; CDE:  3.85 (Right)  Patient Location: Short Stay  Anesthesia Type:MAC  Level of Consciousness: awake, alert  and oriented  Airway and Oxygen Therapy: Patient Spontanous Breathing  Post-op Pain: none  Post-op Assessment: Post-op Vital signs reviewed, Patient's Cardiovascular Status Stable, Respiratory Function Stable, Patent Airway and No signs of Nausea or vomiting  Post-op Vital Signs: Reviewed and stable  Last Vitals:  Filed Vitals:   09/06/14 0705  BP: 117/60  Pulse:   Temp:   Resp: 15    Complications: No apparent anesthesia complications

## 2014-09-06 NOTE — H&P (Signed)
I have reviewed the pre printed H&P, the patient was re-examined, and I have identified no significant interval changes in the patient's medical condition.  There is no change in the plan of care since the history and physical of record. 

## 2014-09-06 NOTE — Op Note (Signed)
09/06/2014  8:12 AM  PATIENT:  Marissa [name]  69 y.o. female  PRE-OPERATIVE DIAGNOSIS:  nuclear cataract right eye  POST-OPERATIVE DIAGNOSIS:  nuclear cataract right eye  PROCEDURE:  Procedure(s): CATARACT EXTRACTION PHACO AND INTRAOCULAR LENS PLACEMENT; CDE:  3.85  SURGEON:  Surgeon(s): Williams Che, MD  ASSISTANTS:  Magdalene Tykeisha, CST   ANESTHESIA STAFF: Anesthesiologist: Lerry Liner, MD CRNA: Ollen Bowl, CRNA  ANESTHESIA:   topical and MAC  REQUESTED LENS POWER: 22.0  LENS IMPLANT INFORMATION: @ORIMPLANT @  CUMULATIVE DISSIPATED ENERGY:3./85  INDICATIONS:see office H&P  OP FINDINGS:mod. dense NS  COMPLICATIONS:None  PROCEDURE:  The patient was brought to the operating room in good condition.  The operative eye was prepped and draped in the usual fashion for intraocular surgery.  Lidocaine gel was dropped onto the eye.  A 2.4 mm 10 O'clock near clear corneal stepped incision and a 12 O'clock stab incision were created.  Viscoat was instilled into the anterior chamber.  The 5 mm anterior capsulorhexis was performed with a bent needle cystotome and Utrata forceps.  The lens was hydrodissected and hydrodelineated with a cannula and balanced salt solution and rotated with a Kuglen hook.  Phacoemulsification was perfomed in the divide and conquer technique.  The remaining cortex was removed with I&A and the capsular surfaces polished as necessary.  Provisc was placed into the capsular bag and the lens inserted with the Alcon inserter.  The viscoelastic was removed with I&A and the lens "rocked" into position.  The wounds were hydrated and te anterior chamber was refilled with balanced salt solution.  The wounds were checked for leakage and rehydrated as necessary.  The lid speculum and drapes were removed and the patient was transported to short stay in good condition.

## 2014-09-06 NOTE — Discharge Instructions (Signed)
Marissa [name] 09/06/2014 Dr. Iona Hansen Post operative Instructions for Cataract Patients  These instructions are for Marissa [name] and pertain to the operative eye.  1.  Resume your normal diet and previous oral medicines.  2. Your Follow-up appointment is at Dr. Iona Hansen' office in [place] on 09/07/2014 at 8:45.  3. You may leave the hospital when your driver is present and your nurse releases you.  4. Begin Pred Forte (prednisolone acetate 1%), Acular LS (ketorolac tromethamine .4%) and Gatifloxacin 0.5% eye drops; 1 drop each 4 times daily to operative eye. Begin 3 hours after discharge from Short Stay Unit.  Moxifloxacin 0.5% may be substituted for Gatifloxacin using the same instructions.  37. Page Dr. Iona Hansen via beeper [phone] for significant pain in or around operative eye that is not relieved by Tylenol.  6. If you took Plavix before surgery, restart it at the usual dose on the evening of surgery.  7. Wear dark glasses as necessary for excessive light sensitivity.  8. Do no forcefully rub you your operative eye.  9. Keep your operative eye dry for 1 week. You may gently clean your eyelids with a damp washcloth.  10. You may resume normal occupational activities in one week and resume driving as tolerated after the first post operative visit.  11. It is normal to have blurred vision and a scratchy sensation following surgery.  Dr. Iona Hansen: [phone]

## 2014-09-06 NOTE — Anesthesia Preprocedure Evaluation (Signed)
Anesthesia Evaluation  Patient identified by MRN, date of birth, ID band Patient awake    Reviewed: Allergy & Precautions, H&P , NPO status , Patient's Chart, lab work & pertinent test results, reviewed documented beta blocker date and time   Airway Mallampati: II  TM Distance: >3 FB Neck ROM: full    Dental no notable dental hx. (+) Teeth Intact, Dental Advisory Given   Pulmonary neg pulmonary ROS,  breath sounds clear to auscultation  Pulmonary exam normal       Cardiovascular Exercise Tolerance: Good hypertension, Pt. on home beta blockers and Pt. on medications Rhythm:regular Rate:Normal     Neuro/Psych negative neurological ROS  negative psych ROS   GI/Hepatic negative GI ROS, Neg liver ROS, GERD-  Medicated and Controlled,  Endo/Other  negative endocrine ROSMorbid obesity  Renal/GU negative Renal ROS  negative genitourinary   Musculoskeletal   Abdominal (+) + obese,   Peds  Hematology negative hematology ROS (+)   Anesthesia Other Findings   Reproductive/Obstetrics negative OB ROS                             Anesthesia Physical Anesthesia Plan  ASA: III  Anesthesia Plan: MAC   Post-op Pain Management:    Induction: Intravenous  Airway Management Planned: Nasal Cannula  Additional Equipment:   Intra-op Plan:   Post-operative Plan:   Informed Consent: I have reviewed the patients History and Physical, chart, labs and discussed the procedure including the risks, benefits and alternatives for the proposed anesthesia with the patient or authorized representative who has indicated his/her understanding and acceptance.   Dental Advisory Given  Plan Discussed with: CRNA and Surgeon  Anesthesia Plan Comments:         Anesthesia Quick Evaluation

## 2014-09-06 NOTE — Brief Op Note (Signed)
09/06/2014  8:09 AM  PATIENT:  Lanny Cramp  69 y.o. female  PRE-OPERATIVE DIAGNOSIS:  nuclear cataract right eye  POST-OPERATIVE DIAGNOSIS:  nuclear cataract right eye  PROCEDURE:  Procedure(s): CATARACT EXTRACTION PHACO AND INTRAOCULAR LENS PLACEMENT; CDE:  3.85  SURGEON:  Surgeon(s): Williams Che, MD  ASSISTANTS:  staff   ANESTHESIA STAFF: Anesthesiologist: Lerry Liner, MD CRNA: Ollen Bowl, CRNA  ANESTHESIA:   topical and MAC  REQUESTED LENS POWER: 22.30  LENS IMPLANT INFORMATION:   Alcon [id number]  S/n [id number]  Exp 02/2019  CUMULATIVE DISSIPATED ENERGY:3.85  INDICATIONS:see scanned office H&P  OP FINDINGS:mod dense NS  COMPLICATIONS:None  DICTATION #: none  PLAN OF CARE: as above  PATIENT DISPOSITION:  Short Stay

## 2014-09-06 NOTE — Transfer of Care (Signed)
Immediate Anesthesia Transfer of Care Note  Patient: Marissa [name]  Procedure(s) Performed: Procedure(s): CATARACT EXTRACTION PHACO AND INTRAOCULAR LENS PLACEMENT; CDE:  3.85 (Right)  Patient Location: Short Stay  Anesthesia Type:MAC  Level of Consciousness: awake  Airway & Oxygen Therapy: Patient Spontanous Breathing  Post-op Assessment: Report given to RN  Post vital signs: Reviewed  Last Vitals:  Filed Vitals:   09/06/14 0705  BP: 117/60  Pulse:   Temp:   Resp: 15    Complications: No apparent anesthesia complications

## 2014-09-07 ENCOUNTER — Encounter (HOSPITAL_COMMUNITY): Payer: Self-pay | Admitting: Ophthalmology

## 2014-12-03 NOTE — Patient Instructions (Signed)
Your procedure is scheduled on: 12/13/2014  Report to [facility] at  615   AM.  Call this number if you have problems the morning of surgery:[phone]   Do not eat food or drink liquids :After Midnight.      Take these medicines the morning of surgery with A SIP OF WATER: norvasc, hyzaar,metoprolol, prilosec  Do not wear jewelry, make-up or nail polish.  Do not wear lotions, powders, or perfumes.   Do not shave 48 hours prior to surgery.  Do not bring valuables to the hospital.  Contacts, dentures or bridgework may not be worn into surgery.  Leave suitcase in the car. After surgery it may be brought to your room.  For patients admitted to the hospital, checkout time is 11:00 AM the day of discharge.   Patients discharged the day of surgery will not be allowed to drive home.  :     Please read over the following fact sheets that you were given: Coughing and Deep Breathing, Surgical Site Infection Prevention, Anesthesia Post-op Instructions and Care and Recovery After Surgery    Cataract A cataract is a clouding of the lens of the eye. When a lens becomes cloudy, vision is reduced based on the degree and nature of the clouding. Many cataracts reduce vision to some degree. Some cataracts make people more near-sighted as they develop. Other cataracts increase glare. Cataracts that are ignored and become worse can sometimes look white. The white color can be seen through the pupil. CAUSES   Aging. However, cataracts may occur at any age, even in newborns.   Certain drugs.   Trauma to the eye.   Certain diseases such as diabetes.   Specific eye diseases such as chronic inflammation inside the eye or a sudden attack of a rare form of glaucoma.   Inherited or acquired medical problems.  SYMPTOMS   Gradual, progressive drop in vision in the affected eye.   Severe, rapid visual loss. This most often happens when trauma is the cause.  DIAGNOSIS  To detect a cataract, an eye doctor  examines the lens. Cataracts are best diagnosed with an exam of the eyes with the pupils enlarged (dilated) by drops.  TREATMENT  For an early cataract, vision may improve by using different eyeglasses or stronger lighting. If that does not help your vision, surgery is the only effective treatment. A cataract needs to be surgically removed when vision loss interferes with your everyday activities, such as driving, reading, or watching TV. A cataract may also have to be removed if it prevents examination or treatment of another eye problem. Surgery removes the cloudy lens and usually replaces it with a substitute lens (intraocular lens, IOL).  At a time when both you and your doctor agree, the cataract will be surgically removed. If you have cataracts in both eyes, only one is usually removed at a time. This allows the operated eye to heal and be out of danger from any possible problems after surgery (such as infection or poor wound healing). In rare cases, a cataract may be doing damage to your eye. In these cases, your caregiver may advise surgical removal right away. The vast majority of people who have cataract surgery have better vision afterward. HOME CARE INSTRUCTIONS  If you are not planning surgery, you may be asked to do the following:  Use different eyeglasses.   Use stronger or brighter lighting.   Ask your eye doctor about reducing your medicine dose or changing  medicines if it is thought that a medicine caused your cataract. Changing medicines does not make the cataract go away on its own.   Become familiar with your surroundings. Poor vision can lead to injury. Avoid bumping into things on the affected side. You are at a higher risk for tripping or falling.   Exercise extreme care when driving or operating machinery.   Wear sunglasses if you are sensitive to bright light or experiencing problems with glare.  SEEK IMMEDIATE MEDICAL CARE IF:   You have a worsening or sudden vision  loss.   You notice redness, swelling, or increasing pain in the eye.   You have a fever.  Document Released: 06/25/2005 Document Revised: 06/14/2011 Document Reviewed: 02/16/2011 [facility] Patient Information 2012 [place].PATIENT INSTRUCTIONS POST-ANESTHESIA  IMMEDIATELY FOLLOWING SURGERY:  Do not drive or operate machinery for the first twenty four hours after surgery.  Do not make any important decisions for twenty four hours after surgery or while taking narcotic pain medications or sedatives.  If you develop intractable nausea and vomiting or a severe headache please notify your doctor immediately.  FOLLOW-UP:  Please make an appointment with your surgeon as instructed. You do not need to follow up with anesthesia unless specifically instructed to do so.  WOUND CARE INSTRUCTIONS (if applicable):  Keep a dry clean dressing on the anesthesia/puncture wound site if there is drainage.  Once the wound has quit draining you may leave it open to air.  Generally you should leave the bandage intact for twenty four hours unless there is drainage.  If the epidural site drains for more than 36-48 hours please call the anesthesia department.  QUESTIONS?:  Please feel free to call your physician or the hospital operator if you have any questions, and they will be happy to assist you.

## 2014-12-07 ENCOUNTER — Encounter (HOSPITAL_COMMUNITY)
Admission: RE | Admit: 2014-12-07 | Discharge: 2014-12-07 | Disposition: A | Discharge status: Home / Self Care | Payer: Medicare Other | Source: Ambulatory Visit | Attending: Ophthalmology | Admitting: Ophthalmology

## 2014-12-07 ENCOUNTER — Encounter (HOSPITAL_COMMUNITY): Payer: Self-pay

## 2014-12-07 DIAGNOSIS — Z01818 Encounter for other preprocedural examination: Secondary | ICD-10-CM | POA: Insufficient documentation

## 2014-12-07 DIAGNOSIS — H2512 Age-related nuclear cataract, left eye: Secondary | ICD-10-CM | POA: Insufficient documentation

## 2014-12-07 LAB — CBC WITH DIFFERENTIAL/PLATELET
BASOS ABS: 0 10*3/uL (ref 0.0–0.1)
Basophils Relative: 1 % (ref 0–1)
EOS PCT: 3 % (ref 0–5)
Eosinophils Absolute: 0.2 10*3/uL (ref 0.0–0.7)
HEMATOCRIT: 41.4 % (ref 36.0–46.0)
Hemoglobin: 13.5 g/dL (ref 12.0–15.0)
Lymphocytes Relative: 28 % (ref 12–46)
Lymphs Abs: 1.7 10*3/uL (ref 0.7–4.0)
MCH: 29.8 pg (ref 26.0–34.0)
MCHC: 32.6 g/dL (ref 30.0–36.0)
MCV: 91.4 fL (ref 78.0–100.0)
Monocytes Absolute: 0.5 10*3/uL (ref 0.1–1.0)
Monocytes Relative: 7 % (ref 3–12)
Neutro Abs: 3.7 10*3/uL (ref 1.7–7.7)
Neutrophils Relative %: 61 % (ref 43–77)
Platelets: 233 10*3/uL (ref 150–400)
RBC: 4.53 MIL/uL (ref 3.87–5.11)
RDW: 12.4 % (ref 11.5–15.5)
WBC: 6.1 10*3/uL (ref 4.0–10.5)

## 2014-12-07 LAB — BASIC METABOLIC PANEL
Anion gap: 10 (ref 5–15)
BUN: 25 mg/dL — AB (ref 6–20)
CO2: 27 mmol/L (ref 22–32)
Calcium: 9.8 mg/dL (ref 8.9–10.3)
Chloride: 102 mmol/L (ref 101–111)
Creatinine, Ser: 0.99 mg/dL (ref 0.44–1.00)
GFR calc Af Amer: >60 mL/min (ref >60)
GFR calc non Af Amer: 57 mL/min — ABNORMAL LOW (ref >60)
GLUCOSE: 89 mg/dL (ref 65–99)
Potassium: 4.2 mmol/L (ref 3.5–5.1)
Sodium: 139 mmol/L (ref 135–145)

## 2014-12-07 NOTE — Pre-Procedure Instructions (Signed)
Patient given information to sign up for my chart at home. 

## 2014-12-10 MED ORDER — CYCLOPENTOLATE-PHENYLEPHRINE OP SOLN OPTIME - NO CHARGE
OPHTHALMIC | Status: AC
  Filled 2014-12-10: qty 2

## 2014-12-10 MED ORDER — LIDOCAINE HCL 3.5 % OP GEL
OPHTHALMIC | Status: AC
  Filled 2014-12-10: qty 1

## 2014-12-10 MED ORDER — TETRACAINE HCL 0.5 % OP SOLN
OPHTHALMIC | Status: AC
  Filled 2014-12-10: qty 2

## 2014-12-13 ENCOUNTER — Ambulatory Visit (HOSPITAL_COMMUNITY): Payer: Medicare Other | Admitting: Anesthesiology

## 2014-12-13 ENCOUNTER — Encounter (HOSPITAL_COMMUNITY)
Admission: RE | Disposition: A | Discharge status: Home / Self Care | Payer: Self-pay | Source: Ambulatory Visit | Attending: Ophthalmology

## 2014-12-13 ENCOUNTER — Ambulatory Visit (HOSPITAL_COMMUNITY)
Admission: RE | Admit: 2014-12-13 | Discharge: 2014-12-13 | Disposition: A | Discharge status: Home / Self Care | Payer: Medicare Other | Source: Ambulatory Visit | Attending: Ophthalmology | Admitting: Ophthalmology

## 2014-12-13 ENCOUNTER — Encounter (HOSPITAL_COMMUNITY): Payer: Self-pay | Admitting: *Deleted

## 2014-12-13 DIAGNOSIS — Z79899 Other long term (current) drug therapy: Secondary | ICD-10-CM | POA: Insufficient documentation

## 2014-12-13 DIAGNOSIS — I10 Essential (primary) hypertension: Secondary | ICD-10-CM | POA: Diagnosis not present

## 2014-12-13 DIAGNOSIS — K219 Gastro-esophageal reflux disease without esophagitis: Secondary | ICD-10-CM | POA: Insufficient documentation

## 2014-12-13 DIAGNOSIS — H2512 Age-related nuclear cataract, left eye: Secondary | ICD-10-CM | POA: Diagnosis present

## 2014-12-13 DIAGNOSIS — Z6837 Body mass index (BMI) 37.0-37.9, adult: Secondary | ICD-10-CM | POA: Diagnosis not present

## 2014-12-13 HISTORY — PX: CATARACT EXTRACTION W/PHACO: SHX586

## 2014-12-13 SURGERY — PHACOEMULSIFICATION, CATARACT, WITH IOL INSERTION
Anesthesia: Monitor Anesthesia Care | Laterality: Left

## 2014-12-13 MED ORDER — LIDOCAINE HCL 3.5 % OP GEL
OPHTHALMIC | Status: DC | PRN
  Administered 2014-12-13: 1 via OPHTHALMIC

## 2014-12-13 MED ORDER — LIDOCAINE HCL 3.5 % OP GEL: 1.0000 "application " | Freq: Once | OPHTHALMIC | Status: DC

## 2014-12-13 MED ORDER — NA HYALUR & NA CHOND-NA HYALUR 0.55-0.5 ML IO KIT
PACK | INTRAOCULAR | Status: DC | PRN
  Administered 2014-12-13: 1 via OPHTHALMIC

## 2014-12-13 MED ORDER — FENTANYL CITRATE (PF) 100 MCG/2ML IJ SOLN
INTRAMUSCULAR | Status: AC
  Filled 2014-12-13: qty 2

## 2014-12-13 MED ORDER — TETRACAINE HCL 0.5 % OP SOLN
1.0000 [drp] | OPHTHALMIC | Status: AC
  Administered 2014-12-13 (×3): 1 [drp] via OPHTHALMIC

## 2014-12-13 MED ORDER — POVIDONE-IODINE 5 % OP SOLN
OPHTHALMIC | Status: DC | PRN
  Administered 2014-12-13: 1 via OPHTHALMIC

## 2014-12-13 MED ORDER — TETRACAINE 0.5 % OP SOLN OPTIME - NO CHARGE
OPHTHALMIC | Status: DC | PRN
  Administered 2014-12-13: 1 [drp] via OPHTHALMIC

## 2014-12-13 MED ORDER — PHENYLEPHRINE-KETOROLAC 1-0.3 % IO SOLN
INTRAOCULAR | Status: DC | PRN
  Administered 2014-12-13: 500 mL via OPHTHALMIC

## 2014-12-13 MED ORDER — MIDAZOLAM HCL 2 MG/2ML IJ SOLN
INTRAMUSCULAR | Status: AC
  Filled 2014-12-13: qty 2

## 2014-12-13 MED ORDER — FENTANYL CITRATE (PF) 100 MCG/2ML IJ SOLN
25.0000 ug | INTRAMUSCULAR | Status: AC
  Administered 2014-12-13 (×2): 25 ug via INTRAVENOUS

## 2014-12-13 MED ORDER — CYCLOPENTOLATE-PHENYLEPHRINE 0.2-1 % OP SOLN
1.0000 [drp] | OPHTHALMIC | Status: AC
  Administered 2014-12-13 (×3): 1 [drp] via OPHTHALMIC

## 2014-12-13 MED ORDER — LACTATED RINGERS IV SOLN
INTRAVENOUS | Status: DC
  Administered 2014-12-13: 1000 mL via INTRAVENOUS

## 2014-12-13 MED ORDER — MIDAZOLAM HCL 2 MG/2ML IJ SOLN
1.0000 mg | INTRAMUSCULAR | Status: DC | PRN
Start: 2014-12-13 — End: 2014-12-13
  Administered 2014-12-13: 2 mg via INTRAVENOUS

## 2014-12-13 MED ORDER — BSS IO SOLN
INTRAOCULAR | Status: AC
  Filled 2014-12-13: qty 4

## 2014-12-13 MED ORDER — BSS IO SOLN
INTRAOCULAR | Status: DC | PRN
  Administered 2014-12-13: 15 mL

## 2014-12-13 SURGICAL SUPPLY — 7 items
CLOTH BEACON ORANGE TIMEOUT ST (SAFETY) ×2 IMPLANT
GLOVE BIOGEL PI IND STRL 7.0 (GLOVE) IMPLANT
GLOVE BIOGEL PI INDICATOR 7.0 (GLOVE) ×4
INST SET CATARACT ~~LOC~~ (KITS) ×3 IMPLANT
LENS ALC ACRYL/TECN (Ophthalmic Related) ×3 IMPLANT
PAD ARMBOARD 7.5X6 YLW CONV (MISCELLANEOUS) ×2 IMPLANT
WATER STERILE IRR 250ML POUR (IV SOLUTION) ×2 IMPLANT

## 2014-12-13 NOTE — Anesthesia Preprocedure Evaluation (Signed)
Anesthesia Evaluation  Patient identified by MRN, date of birth, ID band Patient awake    Reviewed: Allergy & Precautions, H&P , NPO status , Patient's Chart, lab work & pertinent test results, reviewed documented beta blocker date and time   Airway Mallampati: II  TM Distance: >3 FB Neck ROM: full    Dental no notable dental hx. (+) Teeth Intact, Dental Advisory Given   Pulmonary neg pulmonary ROS,  breath sounds clear to auscultation  Pulmonary exam normal       Cardiovascular Exercise Tolerance: Good hypertension, Pt. on home beta blockers and Pt. on medications Rhythm:regular Rate:Normal     Neuro/Psych negative neurological ROS  negative psych ROS   GI/Hepatic negative GI ROS, Neg liver ROS, GERD-  Medicated and Controlled,  Endo/Other  negative endocrine ROSMorbid obesity  Renal/GU negative Renal ROS  negative genitourinary   Musculoskeletal   Abdominal (+) + obese,   Peds  Hematology negative hematology ROS (+)   Anesthesia Other Findings   Reproductive/Obstetrics negative OB ROS                             Anesthesia Physical Anesthesia Plan  ASA: III  Anesthesia Plan: MAC   Post-op Pain Management:    Induction: Intravenous  Airway Management Planned: Nasal Cannula  Additional Equipment:   Intra-op Plan:   Post-operative Plan:   Informed Consent: I have reviewed the patients History and Physical, chart, labs and discussed the procedure including the risks, benefits and alternatives for the proposed anesthesia with the patient or authorized representative who has indicated his/her understanding and acceptance.   Dental Advisory Given  Plan Discussed with: CRNA and Surgeon  Anesthesia Plan Comments:         Anesthesia Quick Evaluation

## 2014-12-13 NOTE — Discharge Instructions (Signed)
Marissa [name] 12/13/2014 Dr. Iona Hansen Post operative Instructions for Cataract Patients  These instructions are for Marissa [name] and pertain to the operative eye.  1.  Resume your normal diet and previous oral medicines.  2. Your Follow-up appointment is at Dr. Iona Hansen' office in [place] on 12/14/14 at 10:15am  3. You may leave the hospital when your driver is present and your nurse releases you.  4. Begin Pred Forte (prednisolone acetate 1%), Acular LS (ketorolac tromethamine .4%) and Gatifloxacin 0.5% eye drops; 1 drop each 4 times daily to operative eye. Begin  after discharge from Short Stay Unit.  Moxifloxacin 0.5% may be substituted for Gatifloxacin using the same instructions.  32. Page Dr. Iona Hansen via beeper [phone] for significant pain in or around operative eye that is not relieved by Tylenol.  6. If you took Plavix before surgery, restart it at the usual dose on the evening of surgery.  7. Wear dark glasses as necessary for excessive light sensitivity.  8. Do no forcefully rub you your operative eye.  9. Keep your operative eye dry for 1 week. You may gently clean your eyelids with a damp washcloth.  10. You may resume normal occupational activities in one week and resume driving as tolerated after the first post operative visit.  11. It is normal to have blurred vision and a scratchy sensation following surgery.  Dr. Iona Hansen: [phone]  PATIENT INSTRUCTIONS POST-ANESTHESIA  IMMEDIATELY FOLLOWING SURGERY:  Do not drive or operate machinery for the first twenty four hours after surgery.  Do not make any important decisions for twenty four hours after surgery or while taking narcotic pain medications or sedatives.  If you develop intractable nausea and vomiting or a severe headache please notify your doctor immediately.  FOLLOW-UP:  Please make an appointment with your surgeon as instructed. You do not need to follow up with anesthesia unless specifically instructed to do  so.  WOUND CARE INSTRUCTIONS (if applicable):  Keep a dry clean dressing on the anesthesia/puncture wound site if there is drainage.  Once the wound has quit draining you may leave it open to air.  Generally you should leave the bandage intact for twenty four hours unless there is drainage.  If the epidural site drains for more than 36-48 hours please call the anesthesia department.  QUESTIONS?:  Please feel free to call your physician or the hospital operator if you have any questions, and they will be happy to assist you.

## 2014-12-13 NOTE — Anesthesia Postprocedure Evaluation (Signed)
  Anesthesia Post-op Note  Patient: Marissa [name]  Procedure(s) Performed: Procedure(s): CATARACT EXTRACTION PHACO AND INTRAOCULAR LENS PLACEMENT LEFT EYE CDE=10.79 (Left)  Patient Location: Short Stay  Anesthesia Type:MAC  Level of Consciousness: awake, alert , oriented and patient cooperative  Airway and Oxygen Therapy: Patient Spontanous Breathing  Post-op Pain: none  Post-op Assessment: Post-op Vital signs reviewed, Patient's Cardiovascular Status Stable, Respiratory Function Stable, Patent Airway, No signs of Nausea or vomiting and Adequate PO intake  Post-op Vital Signs: Reviewed and stable  Last Vitals:  Filed Vitals:   12/13/14 0720  BP: 103/53  Pulse:   Temp:   Resp: 12    Complications: No apparent anesthesia complications

## 2014-12-13 NOTE — H&P (Signed)
I have reviewed the pre printed H&P, the patient was re-examined, and I have identified no significant interval changes in the patient's medical condition.  There is no change in the plan of care since the history and physical of record. 

## 2014-12-13 NOTE — Brief Op Note (Signed)
12/13/2014  8:03 AM  PATIENT:  Marissa [name]  69 y.o. female  PRE-OPERATIVE DIAGNOSIS:  nuclear cataract left eye, difficulty performing daily activities  POST-OPERATIVE DIAGNOSIS:  nuclear cataract left eye, difficulty performing daily activities  PROCEDURE:  Procedure(s): CATARACT EXTRACTION PHACO AND INTRAOCULAR LENS PLACEMENT LEFT EYE CDE=10.79  SURGEON:  Surgeon(s): Williams Che, MD  ASSISTANTS: staff   ANESTHESIA STAFF: Anesthesiologist: Lerry Liner, MD CRNA: Charmaine Downs, CRNA  ANESTHESIA:   topical and MAC  REQUESTED LENS POWER: 23.5  LENS IMPLANT INFORMATION: Alcon [id number]  S/n [id number]  Exp 04/2019  CUMULATIVE DISSIPATED ENERGY:10.79  INDICATIONS:see scanned office H&P  OP FINDINGS:mod dense NS  COMPLICATIONS:None  DICTATION #: none  PLAN OF CARE: as above  PATIENT DISPOSITION:  Short Stay

## 2014-12-13 NOTE — Transfer of Care (Signed)
Immediate Anesthesia Transfer of Care Note  Patient: Marissa [name]  Procedure(s) Performed: Procedure(s): CATARACT EXTRACTION PHACO AND INTRAOCULAR LENS PLACEMENT LEFT EYE CDE=10.79 (Left)  Patient Location: Short Stay  Anesthesia Type:MAC  Level of Consciousness: awake, alert , oriented and patient cooperative  Airway & Oxygen Therapy: Patient Spontanous Breathing  Post-op Assessment: Report given to RN, Post -op Vital signs reviewed and stable and Patient moving all extremities  Post vital signs: Reviewed and stable  Last Vitals:  Filed Vitals:   12/13/14 0720  BP: 103/53  Pulse:   Temp:   Resp: 12    Complications: No apparent anesthesia complications

## 2014-12-13 NOTE — Op Note (Signed)
12/13/2014  8:03 AM  PATIENT:  Marissa [name]  69 y.o. female  PRE-OPERATIVE DIAGNOSIS:  nuclear cataract left eye, difficulty performing daily activities  POST-OPERATIVE DIAGNOSIS:  nuclear cataract left eye, difficulty performing daily activities  PROCEDURE:  Procedure(s): CATARACT EXTRACTION PHACO AND INTRAOCULAR LENS PLACEMENT LEFT EYE CDE=10.79  SURGEON:  Surgeon(s): Williams Che, MD  ASSISTANTS: staff   ANESTHESIA STAFF: Anesthesiologist: Lerry Liner, MD CRNA: Charmaine Downs, CRNA  ANESTHESIA:   topical and MAC  REQUESTED LENS POWER: 23.5  LENS IMPLANT INFORMATION: Alcon SN60WF  S/n [id number]  Exp 04/2019  CUMULATIVE DISSIPATED ENERGY:10.79  INDICATIONS:see scanned office H&P  OP FINDINGS:mod dense NS  COMPLICATIONS:None   PROCEDURE:   The was brought to the operating room in good condition and had general anesthesia administered, then prepped and draped in the usual fashion for intraocular surgery. A 360 degree conjunctival peritomy was performed with bishop harmon forceps and iris scissors. The cornea was excised with a #11 blade. The ocular contents were removed with an evisceration spoon. The ocular interior was carefully stripped with a frazier suction until free of all pigment. Hemostasis was obtained with gentile pressure from a sterile test tube inserted into the posterior pole. A [id number] silicone ball implant Was placed with an implant inserter. Good tissue overlap was noted. The sclera was winged and sutured with 5-0 white vicryl interrupted sutures. The conjunctiva was closed with a running 5-0 white vicryl suture. Bacitracin Oph ung was placed onto the suture line and a silicone conformer placed. A retrobulbar marcaine injection was performed to insure postoperative comfort. The patient was extubated and transferred to PACU for recovery.

## 2014-12-14 ENCOUNTER — Encounter (HOSPITAL_COMMUNITY): Payer: Self-pay | Admitting: Ophthalmology

## 2015-08-11 DIAGNOSIS — L821 Other seborrheic keratosis: Secondary | ICD-10-CM | POA: Diagnosis not present

## 2015-08-11 DIAGNOSIS — D225 Melanocytic nevi of trunk: Secondary | ICD-10-CM | POA: Diagnosis not present

## 2015-11-14 DIAGNOSIS — I10 Essential (primary) hypertension: Secondary | ICD-10-CM | POA: Diagnosis not present

## 2015-11-14 DIAGNOSIS — Z6837 Body mass index (BMI) 37.0-37.9, adult: Secondary | ICD-10-CM | POA: Diagnosis not present

## 2015-11-14 DIAGNOSIS — E7800 Pure hypercholesterolemia, unspecified: Secondary | ICD-10-CM | POA: Diagnosis not present

## 2015-11-14 DIAGNOSIS — Z789 Other specified health status: Secondary | ICD-10-CM | POA: Diagnosis not present

## 2016-01-04 DIAGNOSIS — E7800 Pure hypercholesterolemia, unspecified: Secondary | ICD-10-CM | POA: Diagnosis not present

## 2016-01-04 DIAGNOSIS — I10 Essential (primary) hypertension: Secondary | ICD-10-CM | POA: Diagnosis not present

## 2016-01-11 DIAGNOSIS — I10 Essential (primary) hypertension: Secondary | ICD-10-CM | POA: Diagnosis not present

## 2016-01-11 DIAGNOSIS — E7800 Pure hypercholesterolemia, unspecified: Secondary | ICD-10-CM | POA: Diagnosis not present

## 2016-02-22 DIAGNOSIS — I10 Essential (primary) hypertension: Secondary | ICD-10-CM | POA: Diagnosis not present

## 2016-02-22 DIAGNOSIS — E7800 Pure hypercholesterolemia, unspecified: Secondary | ICD-10-CM | POA: Diagnosis not present

## 2016-04-23 DIAGNOSIS — Z1231 Encounter for screening mammogram for malignant neoplasm of breast: Secondary | ICD-10-CM | POA: Diagnosis not present

## 2016-04-26 DIAGNOSIS — E7800 Pure hypercholesterolemia, unspecified: Secondary | ICD-10-CM | POA: Diagnosis not present

## 2016-04-26 DIAGNOSIS — I10 Essential (primary) hypertension: Secondary | ICD-10-CM | POA: Diagnosis not present

## 2016-04-27 DIAGNOSIS — Z23 Encounter for immunization: Secondary | ICD-10-CM | POA: Diagnosis not present

## 2016-05-07 DIAGNOSIS — I10 Essential (primary) hypertension: Secondary | ICD-10-CM | POA: Diagnosis not present

## 2016-05-07 DIAGNOSIS — R42 Dizziness and giddiness: Secondary | ICD-10-CM | POA: Diagnosis not present

## 2016-05-07 DIAGNOSIS — Z299 Encounter for prophylactic measures, unspecified: Secondary | ICD-10-CM | POA: Diagnosis not present

## 2016-05-07 DIAGNOSIS — E7800 Pure hypercholesterolemia, unspecified: Secondary | ICD-10-CM | POA: Diagnosis not present

## 2016-06-06 DIAGNOSIS — Z79899 Other long term (current) drug therapy: Secondary | ICD-10-CM | POA: Diagnosis not present

## 2016-06-06 DIAGNOSIS — Z6837 Body mass index (BMI) 37.0-37.9, adult: Secondary | ICD-10-CM | POA: Diagnosis not present

## 2016-06-06 DIAGNOSIS — E7800 Pure hypercholesterolemia, unspecified: Secondary | ICD-10-CM | POA: Diagnosis not present

## 2016-06-06 DIAGNOSIS — Z1389 Encounter for screening for other disorder: Secondary | ICD-10-CM | POA: Diagnosis not present

## 2016-06-06 DIAGNOSIS — E559 Vitamin D deficiency, unspecified: Secondary | ICD-10-CM | POA: Diagnosis not present

## 2016-06-06 DIAGNOSIS — R5383 Other fatigue: Secondary | ICD-10-CM | POA: Diagnosis not present

## 2016-06-06 DIAGNOSIS — Z1211 Encounter for screening for malignant neoplasm of colon: Secondary | ICD-10-CM | POA: Diagnosis not present

## 2016-06-06 DIAGNOSIS — Z299 Encounter for prophylactic measures, unspecified: Secondary | ICD-10-CM | POA: Diagnosis not present

## 2016-06-06 DIAGNOSIS — Z7189 Other specified counseling: Secondary | ICD-10-CM | POA: Diagnosis not present

## 2016-06-06 DIAGNOSIS — Z0000 Encounter for general adult medical examination without abnormal findings: Secondary | ICD-10-CM | POA: Diagnosis not present

## 2016-06-25 ENCOUNTER — Other Ambulatory Visit: Payer: Self-pay | Admitting: Cardiovascular Disease

## 2016-06-25 ENCOUNTER — Inpatient Hospital Stay (HOSPITAL_COMMUNITY): Payer: Medicare Other

## 2016-06-25 ENCOUNTER — Inpatient Hospital Stay (HOSPITAL_BASED_OUTPATIENT_CLINIC_OR_DEPARTMENT_OTHER): Payer: Medicare Other

## 2016-06-25 ENCOUNTER — Encounter (HOSPITAL_COMMUNITY): Payer: Self-pay | Admitting: *Deleted

## 2016-06-25 ENCOUNTER — Encounter: Payer: Self-pay | Admitting: Cardiovascular Disease

## 2016-06-25 ENCOUNTER — Ambulatory Visit (INDEPENDENT_AMBULATORY_CARE_PROVIDER_SITE_OTHER): Payer: Medicare Other | Admitting: Cardiovascular Disease

## 2016-06-25 ENCOUNTER — Inpatient Hospital Stay (HOSPITAL_COMMUNITY)
Admission: AD | Admit: 2016-06-25 | Discharge: 2016-06-25 | Disposition: A | Discharge status: Home / Self Care | Payer: Medicare Other | Source: Ambulatory Visit | Attending: Cardiovascular Disease | Admitting: Cardiovascular Disease

## 2016-06-25 ENCOUNTER — Observation Stay (HOSPITAL_COMMUNITY)
Admission: AD | Admit: 2016-06-25 | Discharge: 2016-06-26 | Disposition: A | Discharge status: Home / Self Care | Payer: Medicare Other | Source: Ambulatory Visit | Attending: Internal Medicine | Admitting: Internal Medicine

## 2016-06-25 VITALS — BP 152/84 | HR 63 | Ht 65.0 in | Wt 225.0 lb

## 2016-06-25 DIAGNOSIS — Z7982 Long term (current) use of aspirin: Secondary | ICD-10-CM | POA: Insufficient documentation

## 2016-06-25 DIAGNOSIS — R079 Chest pain, unspecified: Secondary | ICD-10-CM | POA: Diagnosis not present

## 2016-06-25 DIAGNOSIS — I209 Angina pectoris, unspecified: Secondary | ICD-10-CM

## 2016-06-25 DIAGNOSIS — M25512 Pain in left shoulder: Secondary | ICD-10-CM | POA: Insufficient documentation

## 2016-06-25 DIAGNOSIS — E782 Mixed hyperlipidemia: Secondary | ICD-10-CM | POA: Diagnosis not present

## 2016-06-25 DIAGNOSIS — I10 Essential (primary) hypertension: Secondary | ICD-10-CM | POA: Diagnosis not present

## 2016-06-25 DIAGNOSIS — M25519 Pain in unspecified shoulder: Secondary | ICD-10-CM | POA: Diagnosis present

## 2016-06-25 DIAGNOSIS — R072 Precordial pain: Principal | ICD-10-CM | POA: Insufficient documentation

## 2016-06-25 DIAGNOSIS — R0789 Other chest pain: Secondary | ICD-10-CM

## 2016-06-25 DIAGNOSIS — N183 Chronic kidney disease, stage 3 (moderate): Secondary | ICD-10-CM | POA: Diagnosis not present

## 2016-06-25 DIAGNOSIS — Z6837 Body mass index (BMI) 37.0-37.9, adult: Secondary | ICD-10-CM | POA: Diagnosis not present

## 2016-06-25 DIAGNOSIS — Z79899 Other long term (current) drug therapy: Secondary | ICD-10-CM | POA: Insufficient documentation

## 2016-06-25 DIAGNOSIS — E785 Hyperlipidemia, unspecified: Secondary | ICD-10-CM | POA: Diagnosis not present

## 2016-06-25 DIAGNOSIS — Z299 Encounter for prophylactic measures, unspecified: Secondary | ICD-10-CM | POA: Diagnosis not present

## 2016-06-25 DIAGNOSIS — M19012 Primary osteoarthritis, left shoulder: Secondary | ICD-10-CM | POA: Diagnosis not present

## 2016-06-25 LAB — LIPID PANEL
CHOL/HDL RATIO: 5 ratio
CHOLESTEROL: 253 mg/dL — AB (ref 0–200)
HDL: 51 mg/dL (ref >40)
LDL Cholesterol: 173 mg/dL — ABNORMAL HIGH (ref 0–99)
Triglycerides: 143 mg/dL (ref <150)
VLDL: 29 mg/dL (ref 0–40)

## 2016-06-25 LAB — ECHOCARDIOGRAM COMPLETE
Height: 65 in
Weight: 3600 [oz_av]

## 2016-06-25 LAB — TROPONIN I

## 2016-06-25 MED ORDER — HYDROCHLOROTHIAZIDE 25 MG PO TABS
25.0000 mg | ORAL_TABLET | Freq: Every day | ORAL | Status: DC
  Administered 2016-06-26: 25 mg via ORAL
  Filled 2016-06-25: qty 1

## 2016-06-25 MED ORDER — GI COCKTAIL ~~LOC~~: 30.0000 mL | Freq: Four times a day (QID) | ORAL | Status: DC | PRN

## 2016-06-25 MED ORDER — AMLODIPINE BESYLATE 5 MG PO TABS
10.0000 mg | ORAL_TABLET | Freq: Every day | ORAL | Status: DC
  Administered 2016-06-26: 10 mg via ORAL
  Filled 2016-06-25: qty 2

## 2016-06-25 MED ORDER — LOSARTAN POTASSIUM-HCTZ 100-25 MG PO TABS: 1.0000 | ORAL_TABLET | Freq: Every morning | ORAL | Status: DC

## 2016-06-25 MED ORDER — MORPHINE SULFATE (PF) 2 MG/ML IV SOLN: 2.0000 mg | INTRAVENOUS | Status: DC | PRN

## 2016-06-25 MED ORDER — PANTOPRAZOLE SODIUM 40 MG PO TBEC
40.0000 mg | DELAYED_RELEASE_TABLET | Freq: Every day | ORAL | Status: DC
  Administered 2016-06-26: 40 mg via ORAL
  Filled 2016-06-25: qty 1

## 2016-06-25 MED ORDER — ASPIRIN EC 81 MG PO TBEC
81.0000 mg | DELAYED_RELEASE_TABLET | Freq: Every day | ORAL | Status: DC
  Administered 2016-06-26: 81 mg via ORAL
  Filled 2016-06-25: qty 1

## 2016-06-25 MED ORDER — METOPROLOL SUCCINATE ER 25 MG PO TB24
25.0000 mg | ORAL_TABLET | Freq: Every morning | ORAL | Status: DC
  Administered 2016-06-26: 25 mg via ORAL
  Filled 2016-06-25: qty 1

## 2016-06-25 MED ORDER — LOSARTAN POTASSIUM 50 MG PO TABS
100.0000 mg | ORAL_TABLET | Freq: Every day | ORAL | Status: DC
  Administered 2016-06-26: 100 mg via ORAL
  Filled 2016-06-25: qty 2

## 2016-06-25 MED ORDER — METHOCARBAMOL 500 MG PO TABS: 500.0000 mg | ORAL_TABLET | Freq: Four times a day (QID) | ORAL | Status: DC | PRN

## 2016-06-25 MED ORDER — ONDANSETRON HCL 4 MG/2ML IJ SOLN: 4.0000 mg | Freq: Four times a day (QID) | INTRAMUSCULAR | Status: DC | PRN

## 2016-06-25 MED ORDER — ACETAMINOPHEN 325 MG PO TABS: 650.0000 mg | ORAL_TABLET | ORAL | Status: DC | PRN

## 2016-06-25 MED ORDER — ENOXAPARIN SODIUM 40 MG/0.4ML ~~LOC~~ SOLN: 40.0000 mg | SUBCUTANEOUS | Status: DC

## 2016-06-25 NOTE — H&P (Signed)
History and Physical    Marissa [name] [id number] DOB: July 24, 1945 DOA: 06/25/2016  PCP: Monico Blitz, MD Consultants:  Bronson Ing - cardiology Patient coming from: home - lives with husband; NOK: husband, [phone]  Chief Complaint: chest pain  HPI: Marissa [name] is a 70 y.o. female with medical history significant of HTN, HLD, and GERD presenting with shoulder and chest pain.  Saturday, she started with L shoulder and arm pain.  Had pain 2 weeks ago and doctor thought it was a pulled mscule.  Pain recurred while  driving on Saturday.  She also noticed that day that her chest feels tight and occasionally with shooting pain underneath left breast and into sternal/epigastric region.  Pain improved with ASA and rest.  Today, she was doing nothing and became diaphoretic after going to the bathroom.  +chest tightness.  Saw Dr. Manuella Ghazi and he wanted to admit her to [facility].  Instead, she requested an appointment [facility] - last cath was 9 years ago (25% blockage then).  They worked her in and Dr. Raliegh Ip did not think it was a heart attack but he wanted admission for cardiac markers, Echo, stress test tomorrow.  No SOB.  BP was elevated in Dr. Trena Platt office today.  Did water aerobics today and she did have shoulder/arm pain, as well as pain in the posterior neck and across shoulder blades.  She did not have chest tightness during the water aerobics.   Review of Systems: As per HPI; otherwise 10 point review of systems reviewed and negative.   Ambulatory Status:  Ambulates without assistance  Past Medical History:  Diagnosis Date  . GERD (gastroesophageal reflux disease)   . High cholesterol   . Hypertension     Past Surgical History:  Procedure Laterality Date  . CARDIAC CATHETERIZATION     2008- negative findings  . CATARACT EXTRACTION W/PHACO Right 09/06/2014   Procedure: CATARACT EXTRACTION PHACO AND INTRAOCULAR LENS PLACEMENT; CDE:  3.85;  Surgeon: Williams Che, MD;   Location: [facility];  Service: Ophthalmology;  Laterality: Right;  . CATARACT EXTRACTION W/PHACO Left 12/13/2014   Procedure: CATARACT EXTRACTION PHACO AND INTRAOCULAR LENS PLACEMENT LEFT EYE CDE=10.79;  Surgeon: Williams Che, MD;  Location: [facility];  Service: Ophthalmology;  Laterality: Left;  . ESOPHAGOGASTRODUODENOSCOPY N/A 05/05/2014   Procedure: ESOPHAGOGASTRODUODENOSCOPY (EGD);  Surgeon: Rogene Houston, MD;  Location: [facility];  Service: Endoscopy;  Laterality: N/A;  255  . EUS N/A 06/10/2014   Procedure: UPPER ENDOSCOPIC ULTRASOUND (EUS) LINEAR;  Surgeon: Milus Banister, MD;  Location: [facility];  Service: Endoscopy;  Laterality: N/A;  . TUBAL LIGATION      Social History   Social History  . Marital status: Married    Spouse name: N/A  . Number of children: N/A  . Years of education: N/A   Occupational History  . retired    Social History Main Topics  . Smoking status: Never Smoker  . Smokeless tobacco: Never Used  . Alcohol use No  . Drug use: No  . Sexual activity: Yes    Birth control/ protection: Post-menopausal   Other Topics Concern  . Not on file   Social History Narrative  . No narrative on file    Allergies  Allergen Reactions  . Nitrostat [Nitroglycerin] Shortness Of Breath    Family History  Problem Relation Age of Onset  . CAD Mother 43    Prior to Admission medications   Medication Sig Start Date End Date Taking?  Authorizing Provider  amLODipine (NORVASC) 10 MG tablet Take 10 mg by mouth daily.    Historical Provider, MD  aspirin EC 81 MG tablet Take 81 mg by mouth daily.    Historical Provider, MD  cholecalciferol (VITAMIN D) 1000 units tablet Take 1,000 Units by mouth daily. Takes 2000 units daily    Historical Provider, MD  co-enzyme Q-10 30 MG capsule Take 100 mg by mouth daily.    Historical Provider, MD  Javier Docker Oil 500 MG CAPS Take 500 mg by mouth. 3 tabs daily    Historical Provider, MD  losartan-hydrochlorothiazide (HYZAAR) 100-25  MG per tablet Take 1 tablet by mouth every morning.     Historical Provider, MD  metoprolol succinate (TOPROL-XL) 25 MG 24 hr tablet Take 25 mg by mouth every morning.     Historical Provider, MD  omeprazole (PRILOSEC) 20 MG capsule Take 20 mg by mouth daily.    Historical Provider, MD    Physical Exam: Vitals:   06/25/16 1545 06/25/16 1557 06/25/16 2000  BP: (!) 156/58  129/63  Pulse: 68  61  Resp: 16  16  Temp: 97.8 F (36.6 C)  98.3 F (36.8 C)  TempSrc: Oral  Oral  SpO2: 95%  95%  Weight:  102 kg (224 lb 12.8 oz)   Height:  5\' 5"  (1.651 m)      General:  Appears calm and comfortable and is NAD Eyes: EOMI, normal lids, iris ENT:  grossly normal hearing, lips & tongue, mmm Neck:  no LAD, masses or thyromegaly Cardiovascular:  RRR, no m/r/g. No LE edema.  Respiratory:  CTA bilaterally, no w/r/r. Normal respiratory effort. Abdomen:  soft, ntnd, NABS Skin:  no rash or induration seen on limited exam Musculoskeletal:  grossly normal tone BUE/BLE, good ROM, no bony abnormality Psychiatric:  grossly normal mood and affect, speech fluent and appropriate, AOx3 Neurologic:  CN 2-12 grossly intact, moves all extremities in coordinated fashion, sensation intact  Labs on Admission: I have personally reviewed following labs and imaging studies  CBC: No results for input(s): WBC, NEUTROABS, HGB, HCT, MCV, PLT in the last 168 hours. Basic Metabolic Panel: No results for input(s): NA, K, CL, CO2, GLUCOSE, BUN, CREATININE, CALCIUM, MG, PHOS in the last 168 hours. GFR: CrCl cannot be calculated (Patient's most recent lab result is older than the maximum 21 days allowed.). Liver Function Tests: No results for input(s): AST, ALT, ALKPHOS, BILITOT, PROT, ALBUMIN in the last 168 hours. No results for input(s): LIPASE, AMYLASE in the last 168 hours. No results for input(s): AMMONIA in the last 168 hours. Coagulation Profile: No results for input(s): INR, PROTIME in the last 168  hours. Cardiac Enzymes:  Recent Labs Lab 06/25/16 1951  TROPONINI <0.03   BNP (last 3 results) No results for input(s): PROBNP in the last 8760 hours. HbA1C: No results for input(s): HGBA1C in the last 72 hours. CBG: No results for input(s): GLUCAP in the last 168 hours. Lipid Profile: No results for input(s): CHOL, HDL, LDLCALC, TRIG, CHOLHDL, LDLDIRECT in the last 72 hours. Thyroid Function Tests: No results for input(s): TSH, T4TOTAL, FREET4, T3FREE, THYROIDAB in the last 72 hours. Anemia Panel: No results for input(s): VITAMINB12, FOLATE, FERRITIN, TIBC, IRON, RETICCTPCT in the last 72 hours. Urine analysis: No results found for: COLORURINE, APPEARANCEUR, LABSPEC, PHURINE, GLUCOSEU, HGBUR, BILIRUBINUR, KETONESUR, PROTEINUR, UROBILINOGEN, NITRITE, LEUKOCYTESUR  Creatinine Clearance: CrCl cannot be calculated (Patient's most recent lab result is older than the maximum 21 days allowed.).  Sepsis Labs: @LABRCNTIP (procalcitonin:4,lacticidven:4) )  No results found for this or any previous visit (from the past 240 hour(s)).   Radiological Exams on Admission: Dg Shoulder Left  Result Date: 06/25/2016 CLINICAL DATA:  Left shoulder pain for 3 days.  No known injury. EXAM: LEFT SHOULDER - 2+ VIEW COMPARISON:  None. FINDINGS: There is no evidence of fracture or dislocation. Moderate acromioclavicular osteoarthritis noted. Soft tissues are unremarkable. IMPRESSION: No acute abnormality.  Moderate acromioclavicular osteoarthritis. Electronically Signed   By: Inge Rise M.D.   On: 06/25/2016 21:03    EKG: Reviewed by cardiology in the office, not repeated here - reportedly showed NSR with nonspecific ST changes  Assessment/Plan Principal Problem:   Chest pain Active Problems:   HTN (hypertension)   Hyperlipidemia   Pain in joint, shoulder region   Chest pain -Patient with substernal chest tightness that came on spontaneously but was improved with ASA and rest concerning for  angina -Initial troponin negative.   -EKG not indicative of acute ischemia.   -Echo performed today shows preserved EF without wall motion abnormalities and grade 1 diastolic dysfunction -Will plan to place in observation status on telemetry to rule out ACS by overnight observation.  -cycle cardiac enzymes q6h x 3 and repeat EKG in AM -ASA PO daily -morphine prn pain -Risk factor stratification with FLP; will also check TSH -MPS in AM as per Dr. Raliegh Ip - he will see the patient in the AM -Plan for discharge to home tomorrow after stress testing if tests are negative for ischemia  HTN -Continue Norvasc, Hyzaar, Toprol -Poor control in PCP office today -BP has been moderately controlled here so this may have been a stress response -She is on high doses of her current meds other than Toprol, and her HR is in the 60s -Consider addition of hydralazine if an additional medication is needed  HLD -Check FLP -Not on medication at this time  Shoulder pain -Xray ordered at the request of the patient and shows some OA but nothing acute -While CT/MRI of C-spine and shoulder could be considered, this is acute pain that just started on Saturday -Will treat symptomatically with Robaxin and follow for now  DVT prophylaxis:  Lovenox Code Status: Full - confirmed with patient/family Family Communication: Daughter (cancer center employee) present throughout evaluation Disposition Plan:  Home once clinically improved Consults called: Cardiology  Admission status: It is my clinical opinion that referral for OBSERVATION is reasonable and necessary in this patient based on the above information provided. The aforementioned taken together are felt to place the patient at high risk for further clinical deterioration. However it is anticipated that the patient may be medically stable for discharge from the hospital within 24 to 48 hours.     Karmen Bongo MD [facility]  If 7PM-7AM, please contact  night-coverage [email or web address] Password [facility]  06/25/2016, 9:44 PM

## 2016-06-25 NOTE — Patient Instructions (Addendum)
To be admitted to [facility] today room 318   Your physician has requested that you have an echocardiogram. Echocardiography is a painless test that uses sound waves to create images of your heart. It provides your doctor with information about the size and shape of your heart and how well your heart's chambers and valves are working. This procedure takes approximately one hour. There are no restrictions for this procedure.  Your physician has requested that you have a lexiscan myoview IF BLOOD WORK IS NORMAL. For further information please visit [email or web address]. Please follow instruction sheet, as given.     Thank you for choosing [facility] !

## 2016-06-25 NOTE — Progress Notes (Signed)
*  PRELIMINARY RESULTS* Echocardiogram Stat 2D Echocardiogram has been performed.  Samuel Germany 06/25/2016, 3:23 PM

## 2016-06-25 NOTE — Progress Notes (Signed)
SUBJECTIVE: 70 yr old woman with history of hypertension (patient of Dr. Harl Bowie, last evaluated by Gerrianne Scale PA-C in 03/2014) presents with complaints of chest pain. 2015 notes mention she has a history of "very atypical chest pain".  Cardiac MRI 03/09/14: normal LV size and function, EF 60%, moderate LAE, normal valve structure and function, no delayed gadolinium uptake in LV myocardium.  ECG from PCP's office done today shows NSR with nonspecific ST-T abnormalities.  Lipids 06/06/16: TC 273, HDL 54, LDL 187, TG 158.  BP was reportedly "elevated" at PCP's office, and she was instructed to take another BP pill.  She has had chest pain since Saturday, initially occurring while driving. Had pains down her left arm and in upper left and right side of her chest.  Went home and took 2 baby ASA with 2 hours of pain relief.  Recurred on Sunday and she took an additional 2 baby ASA.  She has had exertional chest tightness and left arm pain today, rated at 8/10.  Has not had any blood tests today.   Review of Systems: As per "subjective", otherwise negative.  Allergies  Allergen Reactions  . Nitrostat [Nitroglycerin] Shortness Of Breath    Current Outpatient Prescriptions  Medication Sig Dispense Refill  . cholecalciferol (VITAMIN D) 1000 units tablet Take 1,000 Units by mouth daily. Takes 2000 units daily    . co-enzyme Q-10 30 MG capsule Take 100 mg by mouth daily.    . Krill Oil 500 MG CAPS Take 500 mg by mouth. 3 tabs daily    . amLODipine (NORVASC) 10 MG tablet Take 10 mg by mouth daily.    . aspirin EC 81 MG tablet Take 81 mg by mouth daily.    . losartan-hydrochlorothiazide (HYZAAR) 100-25 MG per tablet Take 1 tablet by mouth every morning.     . metoprolol succinate (TOPROL-XL) 25 MG 24 hr tablet Take 25 mg by mouth every morning.     . omeprazole (PRILOSEC) 20 MG capsule Take 20 mg by mouth daily.     No current facility-administered medications for this visit.     Past  Medical History:  Diagnosis Date  . GERD (gastroesophageal reflux disease)   . High cholesterol   . Hypertension     Past Surgical History:  Procedure Laterality Date  . CARDIAC CATHETERIZATION     20 08- negative findings  . CATARACT EXTRACTION W/PHACO Right 09/06/2014   Procedure: CATARACT EXTRACTION PHACO AND INTRAOCULAR LENS PLACEMENT; CDE:  3.85;  Surgeon: Williams Che, MD;  Location: [facility];  Service: Ophthalmology;  Laterality: Right;  . CATARACT EXTRACTION W/PHACO Left 12/13/2014   Procedure: CATARACT EXTRACTION PHACO AND INTRAOCULAR LENS PLACEMENT LEFT EYE CDE=10.79;  Surgeon: Williams Che, MD;  Location: [facility];  Service: Ophthalmology;  Laterality: Left;  . ESOPHAGOGASTRODUODENOSCOPY N/A 05/05/2014   Procedure: ESOPHAGOGASTRODUODENOSCOPY (EGD);  Surgeon: Rogene Houston, MD;  Location: [facility];  Service: Endoscopy;  Laterality: N/A;  255  . EUS N/A 06/10/2014   Procedure: UPPER ENDOSCOPIC ULTRASOUND (EUS) LINEAR;  Surgeon: Milus Banister, MD;  Location: [facility];  Service: Endoscopy;  Laterality: N/A;  . TUBAL LIGATION      Social History   Social History  . Marital status: Married    Spouse name: N/A  . Number of children: N/A  . Years of education: N/A   Occupational History  . Not on file.   Social History Main Topics  . Smoking status: Never Smoker  .  Smokeless tobacco: Never Used  . Alcohol use No  . Drug use: No  . Sexual activity: Yes    Birth control/ protection: Post-menopausal   Other Topics Concern  . Not on file   Social History Narrative  . No narrative on file     Vitals:   06/25/16 1351  BP: (!) 152/84  Pulse: 63  SpO2: 97%  Weight: 225 lb (102.1 kg)  Height: 5\' 5"  (1.651 m)    PHYSICAL EXAM General: NAD HEENT: Normal. Neck: No JVD, no thyromegaly. Lungs: Clear to auscultation bilaterally with normal respiratory effort. CV: Nondisplaced PMI.  Regular rate and rhythm, normal S1/S2, no S3/S4, no murmur. No pretibial  or periankle edema.  No carotid bruit.   Abdomen: Soft, nontender, obese.  Neurologic: Alert and oriented.  Psych: Normal affect. Skin: Normal. Musculoskeletal: No gross deformities.    ECG: Most recent ECG reviewed.      ASSESSMENT AND PLAN: 1. Chest pain: Pain is concerning for angina. I will admit her to the hospitalist service and have 3 serial troponins checked to rule her out for an acute coronary syndrome. I discussed this patient with both Dr. Orvan Falconer and Dr. Lala Lund.  If troponins are normal, I will proceed with a nuclear myocardial perfusion imaging study (Lexiscan) to evaluate for ischemic heart disease on 06/26/16. I will order a 2-D echocardiogram to be performed today with Doppler to evaluate cardiac structure, function, and regional wall motion.  2. Hypertension: Elevated. May need antihypertensive therapy titration.  3. Hyperlipidemia: Elevated. Reportedly intolerant to multiple statins and Zetia.  Dispo: Admit to hospital.   Kate Sable, M.D., F.A.C.C.

## 2016-06-26 ENCOUNTER — Inpatient Hospital Stay (HOSPITAL_BASED_OUTPATIENT_CLINIC_OR_DEPARTMENT_OTHER): Payer: Medicare Other

## 2016-06-26 ENCOUNTER — Encounter (HOSPITAL_COMMUNITY): Payer: Self-pay

## 2016-06-26 DIAGNOSIS — E7800 Pure hypercholesterolemia, unspecified: Secondary | ICD-10-CM | POA: Diagnosis not present

## 2016-06-26 DIAGNOSIS — R072 Precordial pain: Secondary | ICD-10-CM | POA: Diagnosis not present

## 2016-06-26 DIAGNOSIS — R079 Chest pain, unspecified: Secondary | ICD-10-CM | POA: Diagnosis not present

## 2016-06-26 DIAGNOSIS — I10 Essential (primary) hypertension: Secondary | ICD-10-CM | POA: Diagnosis not present

## 2016-06-26 DIAGNOSIS — E784 Other hyperlipidemia: Secondary | ICD-10-CM

## 2016-06-26 DIAGNOSIS — Z79899 Other long term (current) drug therapy: Secondary | ICD-10-CM | POA: Diagnosis not present

## 2016-06-26 DIAGNOSIS — M25512 Pain in left shoulder: Secondary | ICD-10-CM | POA: Diagnosis not present

## 2016-06-26 DIAGNOSIS — Z7982 Long term (current) use of aspirin: Secondary | ICD-10-CM | POA: Diagnosis not present

## 2016-06-26 LAB — BASIC METABOLIC PANEL
Anion gap: 8 (ref 5–15)
BUN: 23 mg/dL — ABNORMAL HIGH (ref 6–20)
CALCIUM: 10 mg/dL (ref 8.9–10.3)
CO2: 26 mmol/L (ref 22–32)
CREATININE: 1.07 mg/dL — AB (ref 0.44–1.00)
Chloride: 103 mmol/L (ref 101–111)
GFR calc non Af Amer: 51 mL/min — ABNORMAL LOW (ref >60)
GFR, EST AFRICAN AMERICAN: 60 mL/min — AB (ref >60)
GLUCOSE: 97 mg/dL (ref 65–99)
Potassium: 3.2 mmol/L — ABNORMAL LOW (ref 3.5–5.1)
Sodium: 137 mmol/L (ref 135–145)

## 2016-06-26 LAB — TROPONIN I

## 2016-06-26 LAB — NM MYOCAR MULTI W/SPECT W/WALL MOTION / EF
CHL CUP NUCLEAR SDS: 6
CHL CUP NUCLEAR SRS: 0
LHR: 0.27
LV dias vol: 55 mL (ref 46–106)
LVSYSVOL: 22 mL
Peak HR: 118 {beats}/min
Rest HR: 64 {beats}/min
SSS: 6
TID: 1.04

## 2016-06-26 LAB — TSH: TSH: 1.685 u[IU]/mL (ref 0.350–4.500)

## 2016-06-26 MED ORDER — TECHNETIUM TC 99M TETROFOSMIN IV KIT
30.0000 | PACK | Freq: Once | INTRAVENOUS | Status: AC | PRN
  Administered 2016-06-26: 31 via INTRAVENOUS

## 2016-06-26 MED ORDER — REGADENOSON 0.4 MG/5ML IV SOLN
0.4000 mg | Freq: Once | INTRAVENOUS | Status: AC
  Administered 2016-06-26: 0.4 mg via INTRAVENOUS
  Filled 2016-06-26: qty 5

## 2016-06-26 MED ORDER — SODIUM CHLORIDE 0.9% FLUSH
INTRAVENOUS | Status: AC
  Administered 2016-06-26: 10 mL via INTRAVENOUS
  Filled 2016-06-26: qty 10

## 2016-06-26 MED ORDER — REGADENOSON 0.4 MG/5ML IV SOLN
INTRAVENOUS | Status: AC
  Administered 2016-06-26: 0.4 mg via INTRAVENOUS
  Filled 2016-06-26: qty 5

## 2016-06-26 MED ORDER — TECHNETIUM TC 99M TETROFOSMIN IV KIT
10.0000 | PACK | Freq: Once | INTRAVENOUS | Status: AC | PRN
  Administered 2016-06-26: 10 via INTRAVENOUS

## 2016-06-26 NOTE — Progress Notes (Signed)
Patient Name: Marissa [name] Date of Encounter: 06/26/2016  Primary Cardiologist: [facility] Problem List     Principal Problem:   Chest pain Active Problems:   HTN (hypertension)   Hyperlipidemia   Pain in joint, shoulder region     Subjective   No chest pain, no dyspnea.   Inpatient Medications    Scheduled Meds: . amLODipine  10 mg Oral Daily  . aspirin EC  81 mg Oral Daily  . enoxaparin (LOVENOX) injection  40 mg Subcutaneous Q24H  . losartan  100 mg Oral Daily   And  . hydrochlorothiazide  25 mg Oral Daily  . metoprolol succinate  25 mg Oral q morning - 10a  . pantoprazole  40 mg Oral Daily   Continuous Infusions:  PRN Meds: acetaminophen, gi cocktail, methocarbamol, morphine injection, ondansetron (ZOFRAN) IV   Vital Signs    Vitals:   06/25/16 1557 06/25/16 2000 06/26/16 0000 06/26/16 0400  BP:  129/63 (!) 113/57 116/62  Pulse:  61 (!) 57 (!) 52  Resp:  16 16 16   Temp:  98.3 F (36.8 C) 97.8 F (36.6 C) 98.2 F (36.8 C)  TempSrc:  Oral Oral Oral  SpO2:  95% 95% 100%  Weight: 224 lb 12.8 oz (102 kg)     Height: 5\' 5"  (1.651 m)       Intake/Output Summary (Last 24 hours) at 06/26/16 0756 Last data filed at 06/25/16 1604  Gross per 24 hour  Intake                0 ml  Output              250 ml  Net             -250 ml   Filed Weights   06/25/16 1557  Weight: 224 lb 12.8 oz (102 kg)    Physical Exam    GEN: Well nourished, well developed, in no acute distress.  HEENT: Grossly normal.  Neck: Supple, no JVD, carotid bruits, or masses. Cardiac: RRR, no murmurs, rubs, or gallops. No clubbing, cyanosis, edema.  Radials/DP/PT 2+ and equal bilaterally.  Respiratory:  Respirations regular and unlabored, clear to auscultation bilaterally. GI: Soft, nontender, nondistended, BS + x 4. MS: no deformity or atrophy. Skin: warm and dry, no rash. Neuro:  Strength and sensation are intact. Psych: AAOx3.  Normal affect.  Labs      Cardiac Enzymes  Recent Labs  06/25/16 1951 06/25/16 2358 06/26/16 0608  TROPONINI <0.03 <0.03 <0.03   Fasting Lipid Panel  Recent Labs  06/25/16 1951  CHOL 253*  HDL 51  LDLCALC 173*  TRIG 143  CHOLHDL 5.0   Thyroid Function Tests  Recent Labs  06/25/16 2358  TSH 1.685    Telemetry    Sinus Bradycardia - Personally Reviewed  ECG     Sinus bradycardia HR of 52 bpm - Personally Reviewed  Radiology    Dg Shoulder Left  Result Date: 06/25/2016 CLINICAL DATA:  Left shoulder pain for 3 days.  No known injury. EXAM: LEFT SHOULDER - 2+ VIEW COMPARISON:  None. FINDINGS: There is no evidence of fracture or dislocation. Moderate acromioclavicular osteoarthritis noted. Soft tissues are unremarkable. IMPRESSION: No acute abnormality.  Moderate acromioclavicular osteoarthritis. Electronically Signed   By: Inge Rise M.D.   On: 06/25/2016 21:03    Cardiac Studies  Echocardiogram 06/25/2016 Left ventricle: The cavity size was normal. Wall thickness was   normal. Systolic function was normal.  The estimated ejection   fraction was in the range of 60% to 65%. Wall motion was normal;   there were no regional wall motion abnormalities. Doppler   parameters are consistent with abnormal left ventricular   relaxation (grade 1 diastolic dysfunction). - Left atrium: The atrium was mildly dilated. - Tricuspid valve: There was mild regurgitation.  Patient Profile     Marissa [name] is a 70 y.o. female with medical history significant of HTN, HLD, and GERD presenting with shoulder and chest pain. Seen in the office on 06/23/2016 and admitted to rule out ACS. Scheduled for stress test this am.   Assessment & Plan    1. Chest Pain: Admitted from the office with recurrent chest pain with multiple cardiac risk factors. Troponin is negative X 3, EKG negative for acute ST-T wave abnormalities. Echo with normal LV fx and no WMA. Plan Lexiscan stress myoview this am. She has been  NPO. I have discussed this with the patient who verbalizes understanding and is willing to proceed.   2. Hypertension: BP is controlled. (She has taken metoprolol within the last 24 hours, and therefore Lexiscan is ordered). Continue amlodipine and losartan. BMET is pending.   3. Hypercholesterolemia: Cholesterol elevated at 253, LDL 173. She is intolerant to multiple trials of statin therapy. May need to consider injections.     Signed, Jory Sims, NP  06/26/2016, 7:56 AM   The patient was seen and examined, and I agree with the history, physical exam, assessment and plan as documented above, with modifications as noted below. Pt denies chest pain and left arm pain has subsided. I just reviewed nuclear stress test and there is no evidence of ischemia or scar. Echocardiogram performed yesterday showed normal LV systolic function and regional wall motion. From my standpoint she can be discharged with primary care follow up to investigate for alternative causes of her symptoms.  Kate Sable, MD, [facility]  06/26/2016 10:47 AM

## 2016-06-26 NOTE — Discharge Summary (Signed)
Physician Discharge Summary  Marissa [name] [id number] DOB: 08-21-1945 DOA: 06/25/2016  PCP: Monico Blitz, MD  Admit date: 06/25/2016 Discharge date: 06/26/2016  Admitted From: Home. Disposition:  To home.   Recommendations for Outpatient Follow-up:  1. Follow up with PCP in 1-2 weeks  Home Health: None.  Equipment/Devices: None.  Discharge Condition: No chest pain. CODE STATUS: FULL CODE. Diet recommendation: Heart healthy.   Brief/Interim Summary:  Patient was admitted directly from the office of Dr Roxy Horseman for evaluation of chest pain on Jun 25, 2016.  As per her H and P:  " Marissa [name] is a 70 y.o. female with medical history significant of HTN, HLD, and GERD presenting with shoulder and chest pain.  Saturday, she started with L shoulder and arm pain.  Had pain 2 weeks ago and doctor thought it was a pulled mscule.  Pain recurred while  driving on Saturday.  She also noticed that day that her chest feels tight and occasionally with shooting pain underneath left breast and into sternal/epigastric region.  Pain improved with ASA and rest.  Today, she was doing nothing and became diaphoretic after going to the bathroom.  +chest tightness.  Saw Dr. Manuella Ghazi and he wanted to admit her to [facility].  Instead, she requested an appointment [facility] - last cath was 9 years ago (25% blockage then).  They worked her in and Dr. Raliegh Ip did not think it was a heart attack but he wanted admission for cardiac markers, Echo, stress test tomorrow.  No SOB.  BP was elevated in Dr. Trena Platt office today.  Did water aerobics today and she did have shoulder/arm pain, as well as pain in the posterior neck and across shoulder blades.  She did not have chest tightness during the water aerobics.  HOSPITAL COURSE:   Patient was admitted into the hospital, and her meds were continued.  She was r/out with negative serial troponins.    Her ECHO was performed, and it was negaitve.  She was seen in  consultation with Dr Roxy Horseman of cardiology, and proceeded with a nuclear stress test.  It showed no ischemic myocardium at risk, and Dr Harrell Lark recommnended discharge on her previous medications.  She is anxious to go home, and will be discharged home today with PCP follow up next week.  She will follow up with cardiology as recommended.  Thank you for asking me to participate in her care.  Good Day.   Discharge Diagnoses:  Principal Problem:   Chest pain Active Problems:   HTN (hypertension)   Hyperlipidemia   Pain in joint, shoulder region    Discharge Instructions  Discharge Instructions    Diet - low sodium heart healthy    Complete by:  As directed    Increase activity slowly    Complete by:  As directed      Allergies as of 06/26/2016      Reactions   Nitrostat [nitroglycerin] Shortness Of Breath      Medication List    TAKE these medications   amLODipine 10 MG tablet Commonly known as:  NORVASC Take 10 mg by mouth daily.   aspirin EC 81 MG tablet Take 81 mg by mouth daily.   cholecalciferol 1000 units tablet Commonly known as:  VITAMIN D Take 2,000 Units by mouth daily.   co-enzyme Q-10 30 MG capsule Take 100 mg by mouth daily.   Krill Oil 500 MG Caps Take 1,500 mg by mouth daily.   losartan-hydrochlorothiazide 100-25 MG tablet  Commonly known as:  HYZAAR Take 1 tablet by mouth every morning.   metoprolol succinate 25 MG 24 hr tablet Commonly known as:  TOPROL-XL Take 25 mg by mouth every morning.   omeprazole 20 MG capsule Commonly known as:  PRILOSEC Take 20 mg by mouth daily.       Allergies  Allergen Reactions  . Nitrostat [Nitroglycerin] Shortness Of Breath    Consultations:  Cardiology.    Procedures/Studies: Nm Myocar Multi W/spect W/wall Motion / Ef  Result Date: 06/26/2016  There was no ST segment deviation noted during stress.  The study is normal. No gross ischemic territories nor evidence of scar.  This is a low  risk study.  Nuclear stress EF: 60%.    Dg Shoulder Left  Result Date: 06/25/2016 CLINICAL DATA:  Left shoulder pain for 3 days.  No known injury. EXAM: LEFT SHOULDER - 2+ VIEW COMPARISON:  None. FINDINGS: There is no evidence of fracture or dislocation. Moderate acromioclavicular osteoarthritis noted. Soft tissues are unremarkable. IMPRESSION: No acute abnormality.  Moderate acromioclavicular osteoarthritis. Electronically Signed   By: Inge Rise M.D.   On: 06/25/2016 21:03       Subjective: No further CP.   Discharge Exam: Vitals:   06/26/16 0000 06/26/16 0400  BP: (!) 113/57 116/62  Pulse: (!) 57 (!) 52  Resp: 16 16  Temp: 97.8 F (36.6 C) 98.2 F (36.8 C)   Vitals:   06/25/16 1557 06/25/16 2000 06/26/16 0000 06/26/16 0400  BP:  129/63 (!) 113/57 116/62  Pulse:  61 (!) 57 (!) 52  Resp:  16 16 16   Temp:  98.3 F (36.8 C) 97.8 F (36.6 C) 98.2 F (36.8 C)  TempSrc:  Oral Oral Oral  SpO2:  95% 95% 100%  Weight: 102 kg (224 lb 12.8 oz)     Height: 5\' 5"  (1.651 m)       General: Pt is alert, awake, not in acute distress Cardiovascular: RRR, S1/S2 +, no rubs, no gallops Respiratory: CTA bilaterally, no wheezing, no rhonchi Abdominal: Soft, NT, ND, bowel sounds + Extremities: no edema, no cyanosis    The results of significant diagnostics from this hospitalization (including imaging, microbiology, ancillary and laboratory) are listed below for reference.     Microbiology: No results found for this or any previous visit (from the past 240 hour(s)).   Labs: BNP (last 3 results) No results for input(s): BNP in the last 8760 hours. Basic Metabolic Panel:  Recent Labs Lab 06/26/16 0608  NA 137  K 3.2*  CL 103  CO2 26  GLUCOSE 97  BUN 23*  CREATININE 1.07*  CALCIUM 10.0   Cardiac Enzymes:  Recent Labs Lab 06/25/16 1951 06/25/16 2358 06/26/16 0608  TROPONINI <0.03 <0.03 <0.03   Lipid Profile  Recent Labs  06/25/16 1951  CHOL 253*  HDL 51   LDLCALC 173*  TRIG 143  CHOLHDL 5.0   Thyroid function studies  Recent Labs  06/25/16 2358  TSH 1.685    Time coordinating discharge: Over 30 minutes  SIGNED:  Orvan Falconer, MD FACP [facility] 06/26/2016, 11:52 AM   If 7PM-7AM, please contact night-coverage [email or web address] Password [email or web address]

## 2016-06-28 DIAGNOSIS — E2839 Other primary ovarian failure: Secondary | ICD-10-CM | POA: Diagnosis not present

## 2016-07-16 DIAGNOSIS — Z299 Encounter for prophylactic measures, unspecified: Secondary | ICD-10-CM | POA: Diagnosis not present

## 2016-07-16 DIAGNOSIS — E7800 Pure hypercholesterolemia, unspecified: Secondary | ICD-10-CM | POA: Diagnosis not present

## 2016-07-16 DIAGNOSIS — Z6837 Body mass index (BMI) 37.0-37.9, adult: Secondary | ICD-10-CM | POA: Diagnosis not present

## 2016-07-16 DIAGNOSIS — Z789 Other specified health status: Secondary | ICD-10-CM | POA: Diagnosis not present

## 2016-07-16 DIAGNOSIS — I10 Essential (primary) hypertension: Secondary | ICD-10-CM | POA: Diagnosis not present

## 2016-07-16 DIAGNOSIS — N183 Chronic kidney disease, stage 3 (moderate): Secondary | ICD-10-CM | POA: Diagnosis not present

## 2016-07-19 DIAGNOSIS — I10 Essential (primary) hypertension: Secondary | ICD-10-CM | POA: Diagnosis not present

## 2016-07-19 DIAGNOSIS — E7800 Pure hypercholesterolemia, unspecified: Secondary | ICD-10-CM | POA: Diagnosis not present

## 2016-11-19 DIAGNOSIS — E559 Vitamin D deficiency, unspecified: Secondary | ICD-10-CM | POA: Diagnosis not present

## 2016-11-19 DIAGNOSIS — E7800 Pure hypercholesterolemia, unspecified: Secondary | ICD-10-CM | POA: Diagnosis not present

## 2016-11-19 DIAGNOSIS — Z713 Dietary counseling and surveillance: Secondary | ICD-10-CM | POA: Diagnosis not present

## 2016-11-19 DIAGNOSIS — Z299 Encounter for prophylactic measures, unspecified: Secondary | ICD-10-CM | POA: Diagnosis not present

## 2016-11-19 DIAGNOSIS — Z6838 Body mass index (BMI) 38.0-38.9, adult: Secondary | ICD-10-CM | POA: Diagnosis not present

## 2016-11-19 DIAGNOSIS — N183 Chronic kidney disease, stage 3 (moderate): Secondary | ICD-10-CM | POA: Diagnosis not present

## 2016-11-19 DIAGNOSIS — I10 Essential (primary) hypertension: Secondary | ICD-10-CM | POA: Diagnosis not present

## 2016-11-19 DIAGNOSIS — E669 Obesity, unspecified: Secondary | ICD-10-CM | POA: Diagnosis not present

## 2016-11-19 DIAGNOSIS — K219 Gastro-esophageal reflux disease without esophagitis: Secondary | ICD-10-CM | POA: Diagnosis not present

## 2016-11-22 DIAGNOSIS — E7800 Pure hypercholesterolemia, unspecified: Secondary | ICD-10-CM | POA: Diagnosis not present

## 2016-11-22 DIAGNOSIS — I10 Essential (primary) hypertension: Secondary | ICD-10-CM | POA: Diagnosis not present

## 2017-01-01 DIAGNOSIS — L821 Other seborrheic keratosis: Secondary | ICD-10-CM | POA: Diagnosis not present

## 2017-01-01 DIAGNOSIS — D229 Melanocytic nevi, unspecified: Secondary | ICD-10-CM | POA: Diagnosis not present

## 2017-02-12 DIAGNOSIS — Z299 Encounter for prophylactic measures, unspecified: Secondary | ICD-10-CM | POA: Diagnosis not present

## 2017-02-12 DIAGNOSIS — Z713 Dietary counseling and surveillance: Secondary | ICD-10-CM | POA: Diagnosis not present

## 2017-02-12 DIAGNOSIS — Z6838 Body mass index (BMI) 38.0-38.9, adult: Secondary | ICD-10-CM | POA: Diagnosis not present

## 2017-02-12 DIAGNOSIS — J329 Chronic sinusitis, unspecified: Secondary | ICD-10-CM | POA: Diagnosis not present

## 2017-02-15 DIAGNOSIS — J342 Deviated nasal septum: Secondary | ICD-10-CM | POA: Diagnosis not present

## 2017-02-15 DIAGNOSIS — N183 Chronic kidney disease, stage 3 (moderate): Secondary | ICD-10-CM | POA: Diagnosis not present

## 2017-02-15 DIAGNOSIS — R040 Epistaxis: Secondary | ICD-10-CM | POA: Diagnosis not present

## 2017-02-15 DIAGNOSIS — Z299 Encounter for prophylactic measures, unspecified: Secondary | ICD-10-CM | POA: Diagnosis not present

## 2017-02-15 DIAGNOSIS — Z6838 Body mass index (BMI) 38.0-38.9, adult: Secondary | ICD-10-CM | POA: Diagnosis not present

## 2017-02-15 DIAGNOSIS — I10 Essential (primary) hypertension: Secondary | ICD-10-CM | POA: Diagnosis not present

## 2017-02-15 DIAGNOSIS — E7800 Pure hypercholesterolemia, unspecified: Secondary | ICD-10-CM | POA: Diagnosis not present

## 2017-02-16 DIAGNOSIS — E7800 Pure hypercholesterolemia, unspecified: Secondary | ICD-10-CM | POA: Diagnosis not present

## 2017-02-16 DIAGNOSIS — R040 Epistaxis: Secondary | ICD-10-CM | POA: Diagnosis not present

## 2017-02-16 DIAGNOSIS — Z7982 Long term (current) use of aspirin: Secondary | ICD-10-CM | POA: Diagnosis not present

## 2017-02-16 DIAGNOSIS — I10 Essential (primary) hypertension: Secondary | ICD-10-CM | POA: Diagnosis not present

## 2017-02-16 DIAGNOSIS — Z79899 Other long term (current) drug therapy: Secondary | ICD-10-CM | POA: Diagnosis not present

## 2017-02-18 DIAGNOSIS — R040 Epistaxis: Secondary | ICD-10-CM | POA: Diagnosis not present

## 2017-02-18 DIAGNOSIS — N183 Chronic kidney disease, stage 3 (moderate): Secondary | ICD-10-CM | POA: Diagnosis not present

## 2017-02-18 DIAGNOSIS — Z6837 Body mass index (BMI) 37.0-37.9, adult: Secondary | ICD-10-CM | POA: Diagnosis not present

## 2017-02-18 DIAGNOSIS — I10 Essential (primary) hypertension: Secondary | ICD-10-CM | POA: Diagnosis not present

## 2017-02-18 DIAGNOSIS — Z299 Encounter for prophylactic measures, unspecified: Secondary | ICD-10-CM | POA: Diagnosis not present

## 2017-02-18 DIAGNOSIS — E7800 Pure hypercholesterolemia, unspecified: Secondary | ICD-10-CM | POA: Diagnosis not present

## 2017-02-25 DIAGNOSIS — I10 Essential (primary) hypertension: Secondary | ICD-10-CM | POA: Diagnosis not present

## 2017-02-25 DIAGNOSIS — N183 Chronic kidney disease, stage 3 (moderate): Secondary | ICD-10-CM | POA: Diagnosis not present

## 2017-02-25 DIAGNOSIS — K219 Gastro-esophageal reflux disease without esophagitis: Secondary | ICD-10-CM | POA: Diagnosis not present

## 2017-02-25 DIAGNOSIS — E7800 Pure hypercholesterolemia, unspecified: Secondary | ICD-10-CM | POA: Diagnosis not present

## 2017-02-25 DIAGNOSIS — Z299 Encounter for prophylactic measures, unspecified: Secondary | ICD-10-CM | POA: Diagnosis not present

## 2017-02-25 DIAGNOSIS — Z713 Dietary counseling and surveillance: Secondary | ICD-10-CM | POA: Diagnosis not present

## 2017-02-25 DIAGNOSIS — Z6837 Body mass index (BMI) 37.0-37.9, adult: Secondary | ICD-10-CM | POA: Diagnosis not present

## 2017-02-25 DIAGNOSIS — R040 Epistaxis: Secondary | ICD-10-CM | POA: Diagnosis not present

## 2017-03-12 DIAGNOSIS — E7800 Pure hypercholesterolemia, unspecified: Secondary | ICD-10-CM | POA: Diagnosis not present

## 2017-03-12 DIAGNOSIS — I10 Essential (primary) hypertension: Secondary | ICD-10-CM | POA: Diagnosis not present

## 2017-04-22 DIAGNOSIS — Z23 Encounter for immunization: Secondary | ICD-10-CM | POA: Diagnosis not present

## 2017-04-30 DIAGNOSIS — Z1231 Encounter for screening mammogram for malignant neoplasm of breast: Secondary | ICD-10-CM | POA: Diagnosis not present

## 2017-05-02 DIAGNOSIS — J310 Chronic rhinitis: Secondary | ICD-10-CM | POA: Diagnosis not present

## 2017-05-02 DIAGNOSIS — E669 Obesity, unspecified: Secondary | ICD-10-CM | POA: Diagnosis not present

## 2017-05-02 DIAGNOSIS — Z789 Other specified health status: Secondary | ICD-10-CM | POA: Diagnosis not present

## 2017-05-02 DIAGNOSIS — R42 Dizziness and giddiness: Secondary | ICD-10-CM | POA: Diagnosis not present

## 2017-05-02 DIAGNOSIS — I10 Essential (primary) hypertension: Secondary | ICD-10-CM | POA: Diagnosis not present

## 2017-05-08 ENCOUNTER — Other Ambulatory Visit: Payer: Self-pay | Admitting: Internal Medicine

## 2017-05-08 DIAGNOSIS — N6489 Other specified disorders of breast: Secondary | ICD-10-CM | POA: Diagnosis not present

## 2017-05-08 DIAGNOSIS — R921 Mammographic calcification found on diagnostic imaging of breast: Secondary | ICD-10-CM | POA: Diagnosis not present

## 2017-05-08 DIAGNOSIS — R928 Other abnormal and inconclusive findings on diagnostic imaging of breast: Secondary | ICD-10-CM | POA: Diagnosis not present

## 2017-05-08 DIAGNOSIS — N6310 Unspecified lump in the right breast, unspecified quadrant: Secondary | ICD-10-CM

## 2017-05-08 DIAGNOSIS — N6311 Unspecified lump in the right breast, upper outer quadrant: Secondary | ICD-10-CM | POA: Diagnosis not present

## 2017-05-09 ENCOUNTER — Ambulatory Visit
Admission: RE | Admit: 2017-05-09 | Discharge: 2017-05-09 | Disposition: A | Discharge status: Home / Self Care | Payer: Medicare Other | Source: Ambulatory Visit | Attending: Internal Medicine | Admitting: Internal Medicine

## 2017-05-09 ENCOUNTER — Other Ambulatory Visit: Payer: Self-pay | Admitting: Internal Medicine

## 2017-05-09 DIAGNOSIS — N6489 Other specified disorders of breast: Secondary | ICD-10-CM | POA: Diagnosis not present

## 2017-05-09 DIAGNOSIS — D241 Benign neoplasm of right breast: Secondary | ICD-10-CM | POA: Diagnosis not present

## 2017-05-09 DIAGNOSIS — C50411 Malignant neoplasm of upper-outer quadrant of right female breast: Secondary | ICD-10-CM | POA: Diagnosis not present

## 2017-05-09 DIAGNOSIS — N6310 Unspecified lump in the right breast, unspecified quadrant: Secondary | ICD-10-CM

## 2017-05-09 DIAGNOSIS — N6311 Unspecified lump in the right breast, upper outer quadrant: Secondary | ICD-10-CM | POA: Diagnosis not present

## 2017-05-09 DIAGNOSIS — C801 Malignant (primary) neoplasm, unspecified: Secondary | ICD-10-CM

## 2017-05-09 DIAGNOSIS — R921 Mammographic calcification found on diagnostic imaging of breast: Secondary | ICD-10-CM

## 2017-05-09 DIAGNOSIS — R928 Other abnormal and inconclusive findings on diagnostic imaging of breast: Secondary | ICD-10-CM | POA: Diagnosis not present

## 2017-05-09 HISTORY — PX: BREAST BIOPSY: SHX20

## 2017-05-09 HISTORY — DX: Malignant (primary) neoplasm, unspecified: C80.1

## 2017-05-17 ENCOUNTER — Encounter (HOSPITAL_BASED_OUTPATIENT_CLINIC_OR_DEPARTMENT_OTHER): Payer: Self-pay | Admitting: *Deleted

## 2017-05-17 ENCOUNTER — Other Ambulatory Visit: Payer: Self-pay

## 2017-05-17 ENCOUNTER — Encounter (HOSPITAL_BASED_OUTPATIENT_CLINIC_OR_DEPARTMENT_OTHER)
Admission: RE | Admit: 2017-05-17 | Discharge: 2017-05-17 | Disposition: A | Discharge status: Home / Self Care | Payer: Medicare Other | Source: Ambulatory Visit | Attending: General Surgery | Admitting: General Surgery

## 2017-05-17 ENCOUNTER — Other Ambulatory Visit: Payer: Self-pay | Admitting: General Surgery

## 2017-05-17 DIAGNOSIS — C50911 Malignant neoplasm of unspecified site of right female breast: Secondary | ICD-10-CM | POA: Diagnosis not present

## 2017-05-17 DIAGNOSIS — C50411 Malignant neoplasm of upper-outer quadrant of right female breast: Secondary | ICD-10-CM | POA: Diagnosis not present

## 2017-05-17 DIAGNOSIS — Z170 Estrogen receptor positive status [ER+]: Principal | ICD-10-CM

## 2017-05-17 LAB — BASIC METABOLIC PANEL
ANION GAP: 9 (ref 5–15)
BUN: 22 mg/dL — ABNORMAL HIGH (ref 6–20)
CHLORIDE: 102 mmol/L (ref 101–111)
CO2: 27 mmol/L (ref 22–32)
Calcium: 9.8 mg/dL (ref 8.9–10.3)
Creatinine, Ser: 0.95 mg/dL (ref 0.44–1.00)
GFR calc non Af Amer: 59 mL/min — ABNORMAL LOW (ref >60)
Glucose, Bld: 80 mg/dL (ref 65–99)
POTASSIUM: 3.8 mmol/L (ref 3.5–5.1)
SODIUM: 138 mmol/L (ref 135–145)

## 2017-05-20 ENCOUNTER — Ambulatory Visit (HOSPITAL_BASED_OUTPATIENT_CLINIC_OR_DEPARTMENT_OTHER): Payer: Medicare Other | Admitting: Anesthesiology

## 2017-05-20 ENCOUNTER — Other Ambulatory Visit: Payer: Self-pay

## 2017-05-20 ENCOUNTER — Encounter (HOSPITAL_COMMUNITY)
Admission: RE | Admit: 2017-05-20 | Discharge: 2017-05-20 | Disposition: A | Discharge status: Home / Self Care | Payer: Medicare Other | Source: Ambulatory Visit | Attending: General Surgery | Admitting: General Surgery

## 2017-05-20 ENCOUNTER — Ambulatory Visit (HOSPITAL_BASED_OUTPATIENT_CLINIC_OR_DEPARTMENT_OTHER)
Admission: RE | Admit: 2017-05-20 | Discharge: 2017-05-20 | Disposition: A | Discharge status: Home / Self Care | Payer: Medicare Other | Source: Ambulatory Visit | Attending: General Surgery | Admitting: General Surgery

## 2017-05-20 ENCOUNTER — Ambulatory Visit
Admission: RE | Admit: 2017-05-20 | Discharge: 2017-05-20 | Disposition: A | Discharge status: Home / Self Care | Payer: Medicare Other | Source: Ambulatory Visit | Attending: General Surgery | Admitting: General Surgery

## 2017-05-20 ENCOUNTER — Encounter (HOSPITAL_BASED_OUTPATIENT_CLINIC_OR_DEPARTMENT_OTHER): Payer: Self-pay | Admitting: *Deleted

## 2017-05-20 ENCOUNTER — Encounter (HOSPITAL_BASED_OUTPATIENT_CLINIC_OR_DEPARTMENT_OTHER)
Admission: RE | Disposition: A | Discharge status: Home / Self Care | Payer: Self-pay | Source: Ambulatory Visit | Attending: General Surgery

## 2017-05-20 DIAGNOSIS — I10 Essential (primary) hypertension: Secondary | ICD-10-CM | POA: Diagnosis not present

## 2017-05-20 DIAGNOSIS — F419 Anxiety disorder, unspecified: Secondary | ICD-10-CM | POA: Insufficient documentation

## 2017-05-20 DIAGNOSIS — Z170 Estrogen receptor positive status [ER+]: Principal | ICD-10-CM

## 2017-05-20 DIAGNOSIS — C50411 Malignant neoplasm of upper-outer quadrant of right female breast: Secondary | ICD-10-CM

## 2017-05-20 DIAGNOSIS — C50911 Malignant neoplasm of unspecified site of right female breast: Secondary | ICD-10-CM | POA: Diagnosis not present

## 2017-05-20 DIAGNOSIS — K219 Gastro-esophageal reflux disease without esophagitis: Secondary | ICD-10-CM | POA: Diagnosis not present

## 2017-05-20 DIAGNOSIS — D0511 Intraductal carcinoma in situ of right breast: Secondary | ICD-10-CM | POA: Insufficient documentation

## 2017-05-20 DIAGNOSIS — Z79899 Other long term (current) drug therapy: Secondary | ICD-10-CM | POA: Diagnosis not present

## 2017-05-20 DIAGNOSIS — G8918 Other acute postprocedural pain: Secondary | ICD-10-CM | POA: Diagnosis not present

## 2017-05-20 HISTORY — PX: BREAST LUMPECTOMY: SHX2

## 2017-05-20 HISTORY — DX: Malignant (primary) neoplasm, unspecified: C80.1

## 2017-05-20 HISTORY — PX: RADIOACTIVE SEED GUIDED PARTIAL MASTECTOMY WITH AXILLARY SENTINEL LYMPH NODE BIOPSY: SHX6520

## 2017-05-20 HISTORY — DX: Anxiety disorder, unspecified: F41.9

## 2017-05-20 SURGERY — RADIOACTIVE SEED GUIDED PARTIAL MASTECTOMY WITH AXILLARY SENTINEL LYMPH NODE BIOPSY
Anesthesia: General | Site: Breast | Laterality: Right

## 2017-05-20 MED ORDER — LIDOCAINE 2% (20 MG/ML) 5 ML SYRINGE
INTRAMUSCULAR | Status: AC
  Filled 2017-05-20: qty 5

## 2017-05-20 MED ORDER — BUPIVACAINE HCL (PF) 0.25 % IJ SOLN
INTRAMUSCULAR | Status: DC | PRN
  Administered 2017-05-20: 9 mL

## 2017-05-20 MED ORDER — GABAPENTIN 300 MG PO CAPS
300.0000 mg | ORAL_CAPSULE | ORAL | Status: AC
  Administered 2017-05-20: 300 mg via ORAL

## 2017-05-20 MED ORDER — LACTATED RINGERS IV SOLN
INTRAVENOUS | Status: DC
  Administered 2017-05-20: 10:00:00 via INTRAVENOUS

## 2017-05-20 MED ORDER — CEFAZOLIN SODIUM-DEXTROSE 2-4 GM/100ML-% IV SOLN
2.0000 g | INTRAVENOUS | Status: AC
  Administered 2017-05-20: 2 g via INTRAVENOUS

## 2017-05-20 MED ORDER — FENTANYL CITRATE (PF) 100 MCG/2ML IJ SOLN
25.0000 ug | INTRAMUSCULAR | Status: DC | PRN
  Administered 2017-05-20: 50 ug via INTRAVENOUS

## 2017-05-20 MED ORDER — HYDROCODONE-ACETAMINOPHEN 10-325 MG PO TABS: 1.0000 | ORAL_TABLET | Freq: Four times a day (QID) | ORAL | 0 refills | Status: DC | PRN

## 2017-05-20 MED ORDER — BUPIVACAINE-EPINEPHRINE (PF) 0.5% -1:200000 IJ SOLN
INTRAMUSCULAR | Status: DC | PRN
  Administered 2017-05-20: 30 mL

## 2017-05-20 MED ORDER — FENTANYL CITRATE (PF) 100 MCG/2ML IJ SOLN
INTRAMUSCULAR | Status: AC
  Filled 2017-05-20: qty 2

## 2017-05-20 MED ORDER — PROPOFOL 10 MG/ML IV BOLUS
INTRAVENOUS | Status: AC
  Filled 2017-05-20: qty 20

## 2017-05-20 MED ORDER — SODIUM CHLORIDE 0.9 % IJ SOLN
INTRAVENOUS | Status: DC | PRN
  Administered 2017-05-20: 5 mL via INTRADERMAL
  Administered 2017-05-20: 11:00:00 via INTRADERMAL

## 2017-05-20 MED ORDER — DEXAMETHASONE SODIUM PHOSPHATE 4 MG/ML IJ SOLN
INTRAMUSCULAR | Status: DC | PRN
  Administered 2017-05-20: 10 mg via INTRAVENOUS

## 2017-05-20 MED ORDER — DEXAMETHASONE SODIUM PHOSPHATE 10 MG/ML IJ SOLN
INTRAMUSCULAR | Status: AC
  Filled 2017-05-20: qty 1

## 2017-05-20 MED ORDER — MIDAZOLAM HCL 2 MG/2ML IJ SOLN
1.0000 mg | INTRAMUSCULAR | Status: DC | PRN
  Administered 2017-05-20 (×2): 1 mg via INTRAVENOUS

## 2017-05-20 MED ORDER — ACETAMINOPHEN 500 MG PO TABS
ORAL_TABLET | ORAL | Status: AC
  Filled 2017-05-20: qty 2

## 2017-05-20 MED ORDER — LIDOCAINE HCL (CARDIAC) 20 MG/ML IV SOLN
INTRAVENOUS | Status: DC | PRN
  Administered 2017-05-20: 60 mg via INTRAVENOUS

## 2017-05-20 MED ORDER — FENTANYL CITRATE (PF) 100 MCG/2ML IJ SOLN
50.0000 ug | INTRAMUSCULAR | Status: AC | PRN
  Administered 2017-05-20: 25 ug via INTRAVENOUS
  Administered 2017-05-20: 100 ug via INTRAVENOUS
  Administered 2017-05-20: 25 ug via INTRAVENOUS

## 2017-05-20 MED ORDER — TECHNETIUM TC 99M SULFUR COLLOID FILTERED
1.0000 | Freq: Once | INTRAVENOUS | Status: AC | PRN
  Administered 2017-05-20: 1 via INTRADERMAL

## 2017-05-20 MED ORDER — ONDANSETRON HCL 4 MG/2ML IJ SOLN
INTRAMUSCULAR | Status: AC
  Filled 2017-05-20: qty 2

## 2017-05-20 MED ORDER — MIDAZOLAM HCL 2 MG/2ML IJ SOLN
INTRAMUSCULAR | Status: AC
  Filled 2017-05-20: qty 2

## 2017-05-20 MED ORDER — ONDANSETRON HCL 4 MG/2ML IJ SOLN
INTRAMUSCULAR | Status: DC | PRN
  Administered 2017-05-20: 4 mg via INTRAVENOUS

## 2017-05-20 MED ORDER — GABAPENTIN 300 MG PO CAPS
ORAL_CAPSULE | ORAL | Status: AC
  Filled 2017-05-20: qty 1

## 2017-05-20 MED ORDER — SCOPOLAMINE 1 MG/3DAYS TD PT72: 1.0000 | MEDICATED_PATCH | Freq: Once | TRANSDERMAL | Status: DC | PRN

## 2017-05-20 MED ORDER — ACETAMINOPHEN 500 MG PO TABS
1000.0000 mg | ORAL_TABLET | ORAL | Status: AC
  Administered 2017-05-20: 1000 mg via ORAL

## 2017-05-20 MED ORDER — PROPOFOL 10 MG/ML IV BOLUS
INTRAVENOUS | Status: DC | PRN
  Administered 2017-05-20: 150 mg via INTRAVENOUS
  Administered 2017-05-20: 50 mg via INTRAVENOUS

## 2017-05-20 MED ORDER — CEFAZOLIN SODIUM-DEXTROSE 2-4 GM/100ML-% IV SOLN
INTRAVENOUS | Status: AC
  Filled 2017-05-20: qty 100

## 2017-05-20 SURGICAL SUPPLY — 64 items
ADH SKN CLS APL DERMABOND .7 (GAUZE/BANDAGES/DRESSINGS) ×2
APPLIER CLIP 9.375 MED OPEN (MISCELLANEOUS) ×3
APR CLP MED 9.3 20 MLT OPN (MISCELLANEOUS) ×1
BINDER BREAST LRG (GAUZE/BANDAGES/DRESSINGS) IMPLANT
BINDER BREAST MEDIUM (GAUZE/BANDAGES/DRESSINGS) IMPLANT
BINDER BREAST XLRG (GAUZE/BANDAGES/DRESSINGS) IMPLANT
BINDER BREAST XXLRG (GAUZE/BANDAGES/DRESSINGS) IMPLANT
BLADE SURG 15 STRL LF DISP TIS (BLADE) ×1 IMPLANT
BLADE SURG 15 STRL SS (BLADE) ×3
CANISTER SUC SOCK COL 7IN (MISCELLANEOUS) IMPLANT
CANISTER SUCT 1200ML W/VALVE (MISCELLANEOUS) ×2 IMPLANT
CHLORAPREP W/TINT 26ML (MISCELLANEOUS) ×3 IMPLANT
CLIP APPLIE 9.375 MED OPEN (MISCELLANEOUS) IMPLANT
CLIP VESOCCLUDE SM WIDE 6/CT (CLIP) ×3 IMPLANT
CLOSURE WOUND 1/2 X4 (GAUZE/BANDAGES/DRESSINGS) ×1
COVER BACK TABLE 60X90IN (DRAPES) ×3 IMPLANT
COVER MAYO STAND STRL (DRAPES) ×3 IMPLANT
COVER PROBE W GEL 5X96 (DRAPES) ×3 IMPLANT
DECANTER SPIKE VIAL GLASS SM (MISCELLANEOUS) ×2 IMPLANT
DERMABOND ADVANCED (GAUZE/BANDAGES/DRESSINGS) ×4
DERMABOND ADVANCED .7 DNX12 (GAUZE/BANDAGES/DRESSINGS) ×1 IMPLANT
DEVICE DUBIN W/COMP PLATE 8390 (MISCELLANEOUS) ×3 IMPLANT
DRAPE LAPAROSCOPIC ABDOMINAL (DRAPES) ×3 IMPLANT
DRAPE UTILITY XL STRL (DRAPES) ×3 IMPLANT
ELECT COATED BLADE 2.86 ST (ELECTRODE) ×3 IMPLANT
ELECT REM PT RETURN 9FT ADLT (ELECTROSURGICAL) ×3
ELECTRODE REM PT RTRN 9FT ADLT (ELECTROSURGICAL) ×1 IMPLANT
GLOVE BIO SURGEON STRL SZ7 (GLOVE) ×6 IMPLANT
GLOVE BIOGEL PI IND STRL 7.0 (GLOVE) IMPLANT
GLOVE BIOGEL PI IND STRL 7.5 (GLOVE) ×1 IMPLANT
GLOVE BIOGEL PI INDICATOR 7.0 (GLOVE) ×2
GLOVE BIOGEL PI INDICATOR 7.5 (GLOVE) ×2
GLOVE ECLIPSE 6.5 STRL STRAW (GLOVE) ×2 IMPLANT
GOWN STRL REUS W/ TWL LRG LVL3 (GOWN DISPOSABLE) ×2 IMPLANT
GOWN STRL REUS W/TWL LRG LVL3 (GOWN DISPOSABLE) ×6
HEMOSTAT ARISTA ABSORB 3G PWDR (MISCELLANEOUS) ×2 IMPLANT
ILLUMINATOR WAVEGUIDE N/F (MISCELLANEOUS) IMPLANT
KIT MARKER MARGIN INK (KITS) ×3 IMPLANT
LIGHT WAVEGUIDE WIDE FLAT (MISCELLANEOUS) IMPLANT
NDL HYPO 25X1 1.5 SAFETY (NEEDLE) ×1 IMPLANT
NDL SAFETY ECLIPSE 18X1.5 (NEEDLE) IMPLANT
NEEDLE HYPO 18GX1.5 SHARP (NEEDLE) ×3
NEEDLE HYPO 25X1 1.5 SAFETY (NEEDLE) ×6 IMPLANT
NS IRRIG 1000ML POUR BTL (IV SOLUTION) IMPLANT
PACK BASIN DAY SURGERY FS (CUSTOM PROCEDURE TRAY) ×3 IMPLANT
PENCIL BUTTON HOLSTER BLD 10FT (ELECTRODE) ×3 IMPLANT
SLEEVE SCD COMPRESS KNEE MED (MISCELLANEOUS) ×3 IMPLANT
SPONGE LAP 4X18 X RAY DECT (DISPOSABLE) ×3 IMPLANT
STRIP CLOSURE SKIN 1/2X4 (GAUZE/BANDAGES/DRESSINGS) ×2 IMPLANT
SUT ETHILON 2 0 FS 18 (SUTURE) IMPLANT
SUT MNCRL AB 4-0 PS2 18 (SUTURE) ×5 IMPLANT
SUT MON AB 5-0 PS2 18 (SUTURE) IMPLANT
SUT SILK 2 0 SH (SUTURE) IMPLANT
SUT VIC AB 2-0 SH 27 (SUTURE) ×6
SUT VIC AB 2-0 SH 27XBRD (SUTURE) ×1 IMPLANT
SUT VIC AB 3-0 SH 27 (SUTURE) ×3
SUT VIC AB 3-0 SH 27X BRD (SUTURE) ×1 IMPLANT
SUT VIC AB 5-0 PS2 18 (SUTURE) IMPLANT
SYR CONTROL 10ML LL (SYRINGE) ×3 IMPLANT
TOWEL OR 17X24 6PK STRL BLUE (TOWEL DISPOSABLE) ×1 IMPLANT
TOWEL OR NON WOVEN STRL DISP B (DISPOSABLE) ×3 IMPLANT
TUBE CONNECTING 20'X1/4 (TUBING) ×1
TUBE CONNECTING 20X1/4 (TUBING) ×1 IMPLANT
YANKAUER SUCT BULB TIP NO VENT (SUCTIONS) ×2 IMPLANT

## 2017-05-20 NOTE — Anesthesia Preprocedure Evaluation (Addendum)
Anesthesia Evaluation  Patient identified by MRN, date of birth, ID band Patient awake    Reviewed: Allergy & Precautions, H&P , NPO status , Patient's Chart, lab work & pertinent test results  Airway Mallampati: II  TM Distance: >3 FB Neck ROM: Full    Dental no notable dental hx. (+) Teeth Intact, Dental Advisory Given   Pulmonary neg pulmonary ROS,    Pulmonary exam normal breath sounds clear to auscultation       Cardiovascular hypertension, Pt. on medications  Rhythm:Regular Rate:Normal     Neuro/Psych Anxiety negative neurological ROS  negative psych ROS   GI/Hepatic Neg liver ROS, GERD  Medicated and Controlled,  Endo/Other  negative endocrine ROS  Renal/GU negative Renal ROS  negative genitourinary   Musculoskeletal   Abdominal   Peds  Hematology negative hematology ROS (+)   Anesthesia Other Findings   Reproductive/Obstetrics negative OB ROS                            Anesthesia Physical Anesthesia Plan  ASA: III  Anesthesia Plan: General   Post-op Pain Management:  Regional for Post-op pain   Induction: Intravenous  PONV Risk Score and Plan: 4 or greater and Midazolam, Dexamethasone and Ondansetron  Airway Management Planned: LMA  Additional Equipment:   Intra-op Plan:   Post-operative Plan: Extubation in OR  Informed Consent: I have reviewed the patients History and Physical, chart, labs and discussed the procedure including the risks, benefits and alternatives for the proposed anesthesia with the patient or authorized representative who has indicated his/her understanding and acceptance.   Dental advisory given  Plan Discussed with: CRNA  Anesthesia Plan Comments:         Anesthesia Quick Evaluation

## 2017-05-20 NOTE — Discharge Instructions (Signed)
Post Anesthesia Home Care Instructions  Activity: Get plenty of rest for the remainder of the day. A responsible individual must stay with you for 24 hours following the procedure.  For the next 24 hours, DO NOT: -Drive a car -Paediatric nurse -Drink alcoholic beverages -Take any medication unless instructed by your physician -Make any legal decisions or sign important papers.  Meals: Start with liquid foods such as gelatin or soup. Progress to regular foods as tolerated. Avoid greasy, spicy, heavy foods. If nausea and/or vomiting occur, drink only clear liquids until the nausea and/or vomiting subsides. Call your physician if vomiting continues.  Special Instructions/Symptoms: Your throat may feel dry or sore from the anesthesia or the breathing tube placed in your throat during surgery. If this causes discomfort, gargle with warm salt water. The discomfort should disappear within 24 hours.  If you had a scopolamine patch placed behind your ear for the management of post- operative nausea and/or vomiting:  1. The medication in the patch is effective for 72 hours, after which it should be removed.  Wrap patch in a tissue and discard in the trash. Wash hands thoroughly with soap and water. 2. You may remove the patch earlier than 72 hours if you experience unpleasant side effects which may include dry mouth, dizziness or visual disturbances. 3. Avoid touching the patch. Wash your hands with soap and water after contact with the patch.   Standard Office Phone Number [phone]  POST OP INSTRUCTIONS  Always review your discharge instruction sheet given to you by the facility where your surgery was performed.  IF YOU HAVE DISABILITY OR FAMILY LEAVE FORMS, YOU MUST BRING THEM TO THE OFFICE FOR PROCESSING.  DO NOT GIVE THEM TO YOUR DOCTOR.  1. A prescription for pain medication may be given to you upon discharge.  Take your pain medication as prescribed, if needed.   If narcotic pain medicine is not needed, then you may take acetaminophen (Tylenol), naprosyn (Alleve) or ibuprofen (Advil) as needed. 2. Take your usually prescribed medications unless otherwise directed 3. If you need a refill on your pain medication, please contact your pharmacy.  They will contact our office to request authorization.  Prescriptions will not be filled after 5pm or on week-ends. 4. You should eat very light the first 24 hours after surgery, such as soup, crackers, pudding, etc.  Resume your normal diet the day after surgery. 5. Most patients will experience some swelling and bruising in the breast.  Ice packs and a good support bra will help.  Wear the breast binder provided or a sports bra for 72 hours day and night.  After that wear a sports bra during the day until you return to the office. Swelling and bruising can take several days to resolve.  6. It is common to experience some constipation if taking pain medication after surgery.  Increasing fluid intake and taking a stool softener will usually help or prevent this problem from occurring.  A mild laxative (Milk of Magnesia or Miralax) should be taken according to package directions if there are no bowel movements after 48 hours. 7. Unless discharge instructions indicate otherwise, you may remove your bandages 48 hours after surgery and you may shower at that time.  You may have steri-strips (small skin tapes) in place directly over the incision.  These strips should be left on the skin for 7-10 days and will come off on their own.  If your surgeon used skin glue on the incision,  you may shower in 24 hours.  The glue will flake off over the next 2-3 weeks.  Any sutures or staples will be removed at the office during your follow-up visit. 8. ACTIVITIES:  You may resume regular daily activities (gradually increasing) beginning the next day.  Wearing a good support bra or sports bra minimizes pain and swelling.  You may have sexual  intercourse when it is comfortable. a. You may drive when you no longer are taking prescription pain medication, you can comfortably wear a seatbelt, and you can safely maneuver your car and apply brakes. b. RETURN TO WORK:  ______________________________________________________________________________________ 9. You should see your doctor in the office for a follow-up appointment approximately two weeks after your surgery.  Your doctors nurse will typically make your follow-up appointment when she calls you with your pathology report.  Expect your pathology report 3-4 business days after your surgery.  You may call to check if you do not hear from [place] after three days. 10. OTHER INSTRUCTIONS: _______________________________________________________________________________________________ _____________________________________________________________________________________________________________________________________ _____________________________________________________________________________________________________________________________________ _____________________________________________________________________________________________________________________________________  WHEN TO CALL DR WAKEFIELD: 1. Fever over 101.0 2. Nausea and/or vomiting. 3. Extreme swelling or bruising. 4. Continued bleeding from incision. 5. Increased pain, redness, or drainage from the incision.  The clinic staff is available to answer your questions during regular business hours.  Please dont hesitate to call and ask to speak to one of the nurses for clinical concerns.  If you have a medical emergency, go to the nearest emergency room or call 911.  A surgeon from [facility] is always on call at the hospital.  For further questions, please visit [email or web address]  Post Anesthesia Home Care Instructions  Activity: Get plenty of rest for the remainder of the day. A responsible  individual must stay with you for 24 hours following the procedure.  For the next 24 hours, DO NOT: -Drive a car -Paediatric nurse -Drink alcoholic beverages -Take any medication unless instructed by your physician -Make any legal decisions or sign important papers.  Meals: Start with liquid foods such as gelatin or soup. Progress to regular foods as tolerated. Avoid greasy, spicy, heavy foods. If nausea and/or vomiting occur, drink only clear liquids until the nausea and/or vomiting subsides. Call your physician if vomiting continues.  Special Instructions/Symptoms: Your throat may feel dry or sore from the anesthesia or the breathing tube placed in your throat during surgery. If this causes discomfort, gargle with warm salt water. The discomfort should disappear within 24 hours.  If you had a scopolamine patch placed behind your ear for the management of post- operative nausea and/or vomiting:  1. The medication in the patch is effective for 72 hours, after which it should be removed.  Wrap patch in a tissue and discard in the trash. Wash hands thoroughly with soap and water. 2. You may remove the patch earlier than 72 hours if you experience unpleasant side effects which may include dry mouth, dizziness or visual disturbances. 3. Avoid touching the patch. Wash your hands with soap and water after contact with the patch.

## 2017-05-20 NOTE — Anesthesia Procedure Notes (Signed)
Procedure Name: LMA Insertion Date/Time: 05/20/2017 11:12 AM Performed by: Maryella Shivers, CRNA Pre-anesthesia Checklist: Patient identified, Emergency Drugs available, Suction available and Patient being monitored Patient Re-evaluated:Patient Re-evaluated prior to induction Oxygen Delivery Method: Circle system utilized Preoxygenation: Pre-oxygenation with 100% oxygen Induction Type: IV induction Ventilation: Mask ventilation without difficulty LMA: LMA inserted LMA Size: 4.0 Number of attempts: 1 Airway Equipment and Method: Bite block Placement Confirmation: positive ETCO2 Tube secured with: Tape Dental Injury: Teeth and Oropharynx as per pre-operative assessment

## 2017-05-20 NOTE — Op Note (Signed)
Preoperative diagnosis: Right breast cancer, clinical stage I Postoperative diagnosis: same as above Procedure:Rightbreast seed guided lumpectomy Rightdeep axillary sentinel node biopsy Injection blue dye for sentinel node idenfitication Surgeon: Dr Serita Grammes KAJ:[id number] Anes: general  Specimens  1.Right breast tissue marked with paint 2.Rightaxillary sentinel nodes with highest [id number] Complications none Drains none Sponge count correct Dispo to pacu stable  Indications: This is a89yof with a newly diagnosed clinical stage I right breast cancer. We discussed options and have elected to proceed with seed guided lumpectomy and sentinel node biopsy.   Procedure: After informed consent was obtained the patient was taken to the operating room. She first was given technetium in standard periareolar fashion. She had a pectoral block. She was given antibiotics. Sequential compression devices were on her legs. She was then placed under general anesthesia with an LMA. Then she was prepped and draped in the standard sterile surgical fashion. Surgical timeout was then performed. there was little radioactivity in her right axilla. I elected due to that to infiltrate a mixture of methylene blue dye saline in the periareolar location. I massaged this upon completion.  I then located the seed in theupper central right breastI infiltrated marcaine in the skin and then made and incision overlying the tumor as it appeared to be somewhat close to the skin. I then used the neoprobe to remove the seed and the surrounding tissue with attempt to get clear margins. I marked this with paint. MM confirmed removal of seed and theclip.I placed clips in the cavity.I then obtained hemostasis. The seed counts and mammogram appeared the seed was directly in center of tissue.  This was marked as above.I approximated the breast tissue with 2-0 vicryl. The skin was closed with 3-0 vicryl and 4-0  monocryl. Glue and steristrips were applied. I then infiltrated marcaine in the low axilla and made an incision below the hairline.Icarried this through the axillary fascia.She had very little radioactivity and I could not identify blue dye in any nodes. In the low axilla there was one node that was visible with some activity and I excised this node and some surrounding tissue. There were no enlarged nodes. The background radioactivity was zero. I then obtained hemostasis. I closed the axillary fascia with 2-0 vicryl.The skin was closed with 3-0 vicryl and 4-0 monocryl. Glue and steristrips were applied. A binder was placed. She was extubated and transferred to PACU.

## 2017-05-20 NOTE — H&P (Signed)
71 yof referred by Dr Manuella Ghazi for new right breast cancer. She has fh in her daughter of breast cancer that was stage II at age 71 as well as an aunt with inflammatory breast cancer. she works at [employer]. her daughters both had negative brca testing in 2013. she underwent screening mm (had no mass or dc) that showed b density breasts. there was a mass that is 7x6x49m in size and an area of calcifications near there that measures 7x5x4 mm in size. [place] the axilla is negative. biopsy of the calcs is fa with calcs and concordant. 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 she is here with two daughters and her husband. she is retired.   Past Surgical History (Tanisha A. BOwens Shark RBarry 05/17/2017 10:11 AM) Breast Biopsy  Right. Cataract Surgery  Bilateral.  Diagnostic Studies History (Tanisha A. BOwens Shark RT[place] 05/17/2017 10:11 AM) Colonoscopy  1-5 years ago Mammogram  within last year Pap Smear  1-5 years ago  Allergies (Tanisha A. [place]ark [place] 05/17/2017 10:12 AM) Nitroglycerin *ANTIANGINAL AGENTS*  Allergies Reconciled   Medication History (Tanisha A. BOwens Shark RA[place] 05/17/2017 10:14 AM) Losartan Potassium-HCTZ (100-25MG Tablet, Oral) Active. AmLODIPine Besylate (10MG Tablet, Oral) Active. Metoprolol Succinate ER (25MG Tablet ER 24HR, Oral) Active. Omeprazole (20MG Tablet DR, Oral) Active. CoQ10 (100MG Capsule, Oral) Active. Omega-3 (500MG Capsule, Oral) Active. ClonazePAM (0.5MG Tablet, Oral) Active. Medications Reconciled  Social History ([place]anish[place]. [place] 05/17/2017 10:11 AM) Caffeine use  Carbonated beverages, Tea. No alcohol use  No drug use  Tobacco use  Never smoker.  Family History (Tanisha A. BO[place] Shark [place] 05/17/2017 10:11 AM) Arthritis  Father, Mother, Sister. Breast Cancer  Daughter. Cancer  Father, Mother. Hypertension  Father, Mother, Sister. Melanoma  Sister. Respiratory Condition   Mother.  Pregnancy / Birth History (Tanisha A. BOwens Shark RMA; 05/17/2017 10:11 AM) Age at menarche  89years. Age of menopause  [age]Contraceptive History  Oral contraceptives. Gravida  2 Irregular periods  Maternal age  71-30Para  2  Other Problems (Tanisha A. BOwens Shark [place] 05/17/2017 10:11 AM) Breast Cancer  Gastroesophageal Reflux Disease  High blood pressure  Hypercholesterolemia    Review of Systems (Tanisha A. Brown RMA; 05/17/2017 10:11 AM) General Not Present- Appetite Loss, Chills, Fatigue, Fever, Night Sweats, Weight Gain and Weight Loss. Skin Not Present- Change in Wart/Mole, Dryness, Hives, Jaundice, New Lesions, Non-Healing Wounds, Rash and Ulcer. HEENT Present- Wears glasses/contact lenses. Not Present- Earache, Hearing Loss, Hoarseness, Nose Bleed, Oral Ulcers, Ringing in the Ears, Seasonal Allergies, Sinus Pain, Sore Throat, Visual Disturbances and Yellow Eyes. Respiratory Not Present- Bloody sputum, Chronic Cough, Difficulty Breathing, Snoring and Wheezing. Breast Present- Breast Mass. Not Present- Breast Pain, Nipple Discharge and Skin Changes. Cardiovascular Not Present- Chest Pain, Difficulty Breathing Lying Down, Leg Cramps, Palpitations, Rapid Heart Rate, Shortness of Breath and Swelling of Extremities. Gastrointestinal Not Present- Abdominal Pain, Bloating, Bloody Stool, Change in Bowel Habits, Chronic diarrhea, Constipation, Difficulty Swallowing, Excessive gas, Gets full quickly at meals, Hemorrhoids, Indigestion, Nausea, Rectal Pain and Vomiting. Female Genitourinary Not Present- Frequency, Nocturia, Painful Urination, Pelvic Pain and Urgency. Musculoskeletal Not Present- Back Pain, Joint Pain, Joint Stiffness, Muscle Pain, Muscle Weakness and Swelling of Extremities. Neurological Not Present- Decreased Memory, Fainting, Headaches, Numbness, Seizures, Tingling, Tremor, Trouble walking and Weakness. Psychiatric Not Present- Anxiety, Bipolar, Change in Sleep  Pattern, Depression, Fearful and Frequent crying. Endocrine Not Present- Cold Intolerance, Excessive Hunger, Hair Changes, Heat  Intolerance, Hot flashes and New Diabetes. Hematology Not Present- Blood Thinners, Easy Bruising, Excessive bleeding, Gland problems, HIV and Persistent Infections.  Vitals (Tanisha A. Brown RMA; 05/17/2017 10:12 AM) 05/17/2017 10:11 AM Weight: 228.2 lb Height: 65in Body Surface Area: 2.09 m Body Mass Index: 37.97 kg/m  Temp.: 97.75F  Pulse: 69 (Regular)  BP: 142/84 (Sitting, Left Arm, Standard) Physical Exam Rolm Bookbinder MD; 05/17/2017 11:39 AM) General Mental Status-Alert. Orientation-Oriented X3. Eye Sclera/Conjunctiva - Bilateral-No scleral icterus. Chest and Lung Exam Chest and lung exam reveals -quiet, even and easy respiratory effort with no use of accessory muscles and on auscultation, normal breath sounds, no adventitious sounds and normal vocal resonance. Breast Nipples-No Discharge. Breast Lump-No Palpable Breast Mass. Cardiovascular Cardiovascular examination reveals -normal heart sounds, regular rate and rhythm with no murmurs. Abdomen Note: soft no hepatomegaly Lymphatic Head & Neck General Head & Neck Lymphatics: Bilateral - Description - Normal. Axillary General Axillary Region: Bilateral - Description - Normal. Note: no  adenopathy   Assessment & Plan Rolm Bookbinder MD; 05/17/2017 2:20 PM) BREAST CANCER OF UPPER-OUTER QUADRANT OF RIGHT FEMALE BREAST (C50.411) Story: Right breast seed guided lumpectomy, right axillary sentinel node biopsy We discussed the staging and pathophysiology of breast cancer. We discussed all of the different options for treatment for breast cancer including surgery, chemotherapy, radiation therapy, Herceptin, and antiestrogen therapy. We discussed a sentinel lymph node biopsy as she does not appear to having lymph node involvement right now. We discussed the performance of  that with injection of radioactive tracer. We discussed that there is a chance of having a positive node with a sentinel lymph node biopsy and we will await the permanent pathology to make any other first further decisions in terms of her treatment. One of these options might be to return to the operating room to perform an axillary lymph node dissection. We discussed up to a 5% risk lifetime of chronic shoulder pain as well as lymphedema associated with a sentinel lymph node biopsy. We discussed the options for treatment of the breast cancer which included lumpectomy versus a mastectomy. We discussed the performance of the lumpectomy with radioactive seed placement. We discussed a 5-10% chance of a positive margin requiring reexcision in the operating room. We also discussed that she will might need radiation therapy (this is usually 5-7 weeks) if she undergoes lumpectomy. We discussed the mastectomy and the postoperative care for that as well. Mastectomy can be followed by reconstruction. This is a more extensive surgery and requires more recovery. The decision for lumpectomy vs mastectomy has no impact on decision for chemotherapy. We discussed that there is no difference in her survival whether she undergoes lumpectomy with radiation therapy and/or antiestrogen therapy versus a mastectomy. There is also no real difference between her recurrence in the breast. We discussed the risks of operation including bleeding, infection, possible reoperation. further therapy based on results of operation .oncotype discussed

## 2017-05-20 NOTE — Anesthesia Procedure Notes (Signed)
Anesthesia Regional Block: Pectoralis block   Pre-Anesthetic Checklist: ,, timeout performed, Correct Patient, Correct Site, Correct Laterality, Correct Procedure, Correct Position, site marked, Risks and benefits discussed, pre-op evaluation,  At surgeon's request and post-op pain management  Laterality: Right  Prep: Maximum Sterile Barrier Precautions used, chloraprep       Needles:  Injection technique: Single-shot  Needle Type: Echogenic Stimulator Needle     Needle Length: 9cm  Needle Gauge: 21     Additional Needles:   Procedures:,,,, ultrasound used (permanent image in chart),,,,  Narrative:  Start time: 05/20/2017 10:25 AM End time: 05/20/2017 10:35 AM Injection made incrementally with aspirations every 5 mL. Anesthesiologist: Roderic Palau, MD  Additional Notes: 2% Lidocaine skin wheel.

## 2017-05-20 NOTE — Progress Notes (Signed)
Assisted Dr. Edmond Fitzgerald with right, ultrasound guided, pectoralis block. Side rails up, monitors on throughout procedure. See vital signs in flow sheet. Tolerated Procedure well. 

## 2017-05-20 NOTE — Transfer of Care (Signed)
Immediate Anesthesia Transfer of Care Note  Patient: Marissa [name]  Procedure(s) Performed: RIGHT BREAST LUMPECTOMY WITH RADIOACTIVE SEED AND SENTINEL LYMPH NODE BIOPSY (Right Breast)  Patient Location: PACU  Anesthesia Type:General  Level of Consciousness: sedated  Airway & Oxygen Therapy: Patient Spontanous Breathing and Patient connected to face mask oxygen  Post-op Assessment: Report given to RN and Post -op Vital signs reviewed and stable  Post vital signs: Reviewed and stable  Last Vitals:  Vitals:   05/20/17 1100 05/20/17 1105  BP:    Pulse: 71 74  Resp: 16 (!) 28  Temp:    SpO2: 98% 98%    Last Pain:  Vitals:   05/20/17 0908  TempSrc: Oral         Complications: No apparent anesthesia complications

## 2017-05-20 NOTE — Interval H&P Note (Signed)
History and Physical Interval Note:  05/20/2017 11:01 AM  Marissa [name]  has presented today for surgery, with the diagnosis of RIGHT BREAST CANCER  The various methods of treatment have been discussed with the patient and family. After consideration of risks, benefits and other options for treatment, the patient has consented to  Procedure(s): RIGHT BREAST LUMPECTOMY WITH RADIOACTIVE SEED AND SENTINEL LYMPH NODE BIOPSY (Right) as a surgical intervention .  The patient's history has been reviewed, patient examined, no change in status, stable for surgery.  I have reviewed the patient's chart and labs.  Questions were answered to the patient's satisfaction.     Burnette Valenti

## 2017-05-21 ENCOUNTER — Encounter (HOSPITAL_BASED_OUTPATIENT_CLINIC_OR_DEPARTMENT_OTHER): Payer: Self-pay | Admitting: General Surgery

## 2017-05-21 NOTE — Anesthesia Postprocedure Evaluation (Signed)
Anesthesia Post Note  Patient: Marissa [name]  Procedure(s) Performed: RIGHT BREAST LUMPECTOMY WITH RADIOACTIVE SEED AND SENTINEL LYMPH NODE BIOPSY (Right Breast)     Patient location during evaluation: PACU Anesthesia Type: General and Regional Level of consciousness: awake and alert Pain management: pain level controlled Vital Signs Assessment: post-procedure vital signs reviewed and stable Respiratory status: spontaneous breathing, nonlabored ventilation and respiratory function stable Cardiovascular status: blood pressure returned to baseline and stable Postop Assessment: no apparent nausea or vomiting Anesthetic complications: no    Last Vitals:  Vitals:   05/20/17 1300 05/20/17 1330  BP: (!) 141/75 137/62  Pulse: 62   Resp: 17 16  Temp:  36.6 C  SpO2: 93% 94%    Last Pain:  Vitals:   05/20/17 1245  TempSrc:   PainSc: 2                  Hazley Dezeeuw,W. EDMOND

## 2017-05-22 ENCOUNTER — Encounter: Payer: Self-pay | Admitting: Radiation Oncology

## 2017-05-23 ENCOUNTER — Encounter (HOSPITAL_COMMUNITY): Payer: Self-pay | Admitting: Emergency Medicine

## 2017-05-23 NOTE — Progress Notes (Signed)
oncotype sent.  Fax confirmed to pathology.

## 2017-06-04 ENCOUNTER — Encounter (HOSPITAL_COMMUNITY): Payer: Self-pay

## 2017-06-06 NOTE — Progress Notes (Signed)
Location of Breast Cancer: Right Breast  Histology per Pathology Report:  05/09/17 Diagnosis 1. Breast, right, needle core biopsy, upper outer quadrant, posterior depth - FIBROADENOMA WITH CALCIFICATIONS. - FIBROCYSTIC CHANGES WITH CALCIFICATIONS. - THERE IS NO EVIDENCE OF MALIGNANCY. - SEE COMMENT. 2. Breast, right, needle core biopsy, upper outer quadrant, middle depth - INVASIVE DUCTAL CARCINOMA.  2. Receptor Status: ER(100%), PR (90%), Her2-neu (Neg), Ki-(5%)  05/20/17 Diagnosis 1. Breast, lumpectomy, Right - INVASIVE DUCTAL CARCINOMA, GRADE 1, SPANNING 1.4 CM. - RESECTION MARGINS ARE NEGATIVE FOR CARCINOMA. - BIOPSY SITE CHANGES. - SEE ONCOLOGY TABLE. 2. Lymph node, sentinel, biopsy, Right - ONE OF ONE LYMPH NODES NEGATIVE FOR CARCINOMA (0/1). 3. Lymph node, sentinel, biopsy, Right - ONE OF ONE LYMPH NODES NEGATIVE FOR CARCINOMA (0/1).  Did patient present with symptoms or was this found on screening mammography?: It was found on a screening mammogram.   Past/Anticipated interventions by surgeon, if any: 05/20/17 Procedure:Rightbreast seed guided lumpectomy Rightdeep axillary sentinel node biopsy Injection blue dye for sentinel node idenfitication Surgeon: Dr Serita Grammes   Past/Anticipated interventions by medical oncology, if any:  She has an appointment a [facility] (covering provider) on 06/13/17 Genetic counseling with Roma Kayser 06/20/17  Lymphedema issues, if any:  She denies. She has good arm mobility.  Pain issues, if any:  She denies  SAFETY ISSUES:  Prior radiation? No  Pacemaker/ICD? No  Possible current pregnancy? No  Is the patient on methotrexate? No  Current Complaints / other details:    BP (!) 146/62   Pulse 68   Temp 98 F (36.7 C)   Ht _0  (1.651 m)   Wt 226 lb 6.4 oz (102.7 kg)   SpO2 100% Comment: room air  BMI 37.67 kg/m    Wt Readings from Last 3 Encounters:  06/11/17 226 lb 6.4 oz (102.7 kg)  05/20/17 228 lb (103.4  kg)  06/25/16 224 lb 12.8 oz (102 kg)      Marynell Bies, Stephani Police, RN 06/06/2017,1:54 PM

## 2017-06-11 ENCOUNTER — Ambulatory Visit
Admission: RE | Admit: 2017-06-11 | Discharge: 2017-06-11 | Disposition: A | Discharge status: Home / Self Care | Payer: Medicare Other | Source: Ambulatory Visit | Attending: Radiation Oncology | Admitting: Radiation Oncology

## 2017-06-11 ENCOUNTER — Encounter: Payer: Self-pay | Admitting: Radiation Oncology

## 2017-06-11 DIAGNOSIS — F419 Anxiety disorder, unspecified: Secondary | ICD-10-CM | POA: Insufficient documentation

## 2017-06-11 DIAGNOSIS — I10 Essential (primary) hypertension: Secondary | ICD-10-CM | POA: Diagnosis not present

## 2017-06-11 DIAGNOSIS — Z9889 Other specified postprocedural states: Secondary | ICD-10-CM | POA: Diagnosis not present

## 2017-06-11 DIAGNOSIS — Z961 Presence of intraocular lens: Secondary | ICD-10-CM | POA: Insufficient documentation

## 2017-06-11 DIAGNOSIS — Z170 Estrogen receptor positive status [ER+]: Secondary | ICD-10-CM | POA: Insufficient documentation

## 2017-06-11 DIAGNOSIS — Z510 Encounter for antineoplastic radiation therapy: Secondary | ICD-10-CM | POA: Diagnosis not present

## 2017-06-11 DIAGNOSIS — E7800 Pure hypercholesterolemia, unspecified: Secondary | ICD-10-CM | POA: Insufficient documentation

## 2017-06-11 DIAGNOSIS — K219 Gastro-esophageal reflux disease without esophagitis: Secondary | ICD-10-CM | POA: Insufficient documentation

## 2017-06-11 DIAGNOSIS — Z9841 Cataract extraction status, right eye: Secondary | ICD-10-CM | POA: Insufficient documentation

## 2017-06-11 DIAGNOSIS — Z9842 Cataract extraction status, left eye: Secondary | ICD-10-CM | POA: Insufficient documentation

## 2017-06-11 DIAGNOSIS — Z8249 Family history of ischemic heart disease and other diseases of the circulatory system: Secondary | ICD-10-CM | POA: Insufficient documentation

## 2017-06-11 DIAGNOSIS — Z7982 Long term (current) use of aspirin: Secondary | ICD-10-CM | POA: Insufficient documentation

## 2017-06-11 DIAGNOSIS — Z79899 Other long term (current) drug therapy: Secondary | ICD-10-CM | POA: Insufficient documentation

## 2017-06-11 DIAGNOSIS — C50411 Malignant neoplasm of upper-outer quadrant of right female breast: Secondary | ICD-10-CM | POA: Insufficient documentation

## 2017-06-11 NOTE — Progress Notes (Signed)
Radiation Oncology         [phone] ________________________________  Initial outpatient Consultation  Name: Marissa [name] MRN: [id number]  Date: 06/11/2017  DOB: 10/10/1945  PV:XYIA, Weldon Picking, MD  Rolm Bookbinder, MD   REFERRING PHYSICIAN: Rolm Bookbinder, MD  DIAGNOSIS:    ICD-10-CM   1. Carcinoma of upper-outer quadrant of right breast in female, estrogen receptor positive ([place]) C50.411    Z17.0    Stage IA T1cN0M0 Right Breast UOQ Invasive Ductal Carcinoma, ER 100% / PR 90% / Her2 Neg, Grade 1  CHIEF COMPLAINT: Here to discuss management of her right breast cancer  HISTORY OF PRESENT ILLNESS::[name] is a 71 y.o. female who presented for screening mammogram.  Biopsy showed invasive ductal carcinoma ER+/PR+ Her2 neg.  Patient underwent right breast lumpectomy with sentinel node biopsy performed by Dr. Donne Hazel on 05/20/17. Pathology confirmed invasive ductal carcinoma, Grade 1, spanning 1.4 cm. All margins were negative by at least 0.3 cm. Both lymph nodes collected were negative for malignancy. Oncotype score returned as a 10, low risk.   On review of systems, patient endorses water aerobics and plans to resume this once she is healed from her surgery. She denies lymphedema or mobility issues. She denies pain. She reports baseline ankle swelling  PREVIOUS RADIATION THERAPY: No  PAST MEDICAL HISTORY:  has a past medical history of Anxiety, Cancer ([place]) (05/2017), GERD (gastroesophageal reflux disease), High cholesterol, and Hypertension.    PAST SURGICAL HISTORY: Past Surgical History:  Procedure Laterality Date  . CARDIAC CATHETERIZATION     2008- negative findings  . CATARACT EXTRACTION W/PHACO Right 09/06/2014   Procedure: CATARACT EXTRACTION PHACO AND INTRAOCULAR LENS PLACEMENT; CDE:  3.85;  Surgeon: Williams Che, MD;  Location: [facility];  Service: Ophthalmology;  Laterality: Right;  . CATARACT EXTRACTION W/PHACO Left 12/13/2014   Procedure:  CATARACT EXTRACTION PHACO AND INTRAOCULAR LENS PLACEMENT LEFT EYE CDE=10.79;  Surgeon: Williams Che, MD;  Location: [facility];  Service: Ophthalmology;  Laterality: Left;  . ESOPHAGOGASTRODUODENOSCOPY N/A 05/05/2014   Procedure: ESOPHAGOGASTRODUODENOSCOPY (EGD);  Surgeon: Rogene Houston, MD;  [facility];  Service: Endoscopy;  Laterality: N/A;  255  . EUS N/A 06/10/2014   Procedure: UPPER ENDOSCOPIC ULTRASOUND (EUS) LINEAR;  Surgeon: Milus Banister, MD;  [facility];  Service: Endoscopy;  Laterality: N/A;  . RADIOACTIVE SEED GUIDED PARTIAL MASTECTOMY WITH AXILLARY SENTINEL LYMPH NODE BIOPSY Right 05/20/2017   Procedure: RIGHT BREAST LUMPECTOMY WITH RADIOACTIVE SEED AND SENTINEL LYMPH NODE BIOPSY;  Surgeon: Rolm Bookbinder, MD;  Location: [facility];  Service: General;  Laterality: Right;  . TUBAL LIGATION      FAMILY HISTORY: family history includes CAD (age of onset: 71) in her mother.  SOCIAL HISTORY:  reports that  has never smoked. she has never used smokeless tobacco. She reports that she does not drink alcohol or use drugs.  ALLERGIES: Nitrostat [nitroglycerin]  MEDICATIONS:  Current Outpatient Medications  Medication Sig Dispense Refill  . amLODipine (NORVASC) 10 MG tablet Take 10 mg by mouth daily.    Marland Kitchen aspirin EC 81 MG tablet Take 81 mg by mouth daily.    . cholecalciferol (VITAMIN D) 1000 units tablet Take 2,000 Units by mouth daily.     . clonazePAM (KLONOPIN) 0.5 MG tablet Take 0.5 mg 2 (two) times daily as needed by mouth for anxiety.    Marland Kitchen co-enzyme Q-10 30 MG capsule Take 100 mg by mouth daily.    Marland Kitchen losartan-hydrochlorothiazide (HYZAAR)  100-25 MG per tablet Take 1 tablet by mouth every morning.     . metoprolol succinate (TOPROL-XL) 25 MG 24 hr tablet Take 25 mg by mouth every morning.     . Omega-3 Fatty Acids (OMEGA 3 500) 500 MG CAPS Take by mouth.    Marland Kitchen omeprazole (PRILOSEC) 20 MG capsule Take 20 mg by mouth daily.    Marland Kitchen  HYDROcodone-acetaminophen (NORCO) 10-325 MG tablet Take 1 tablet every 6 (six) hours as needed by mouth. (Patient not taking: Reported on 06/11/2017) 10 tablet 0   No current facility-administered medications for this encounter.     REVIEW OF SYSTEMS: A 10+ POINT REVIEW OF SYSTEMS WAS OBTAINED including neurology, dermatology, psychiatry, cardiac, respiratory, lymph, extremities, GI, GU, Musculoskeletal, constitutional, breasts, HEENT.  All pertinent positives are noted in the HPI.  All others are negative.   PHYSICAL EXAM:  height is 5' 5"  (1.651 m) and weight is 226 lb 6.4 oz (102.7 kg). Her temperature is 98 F (36.7 C). Her blood pressure is 146/62 (abnormal) and her pulse is 68. Her oxygen saturation is 100%.   General: Alert and oriented, in no acute distress HEENT: Head is normocephalic. Extraocular movements are intact. Oropharynx is clear. Neck: Neck is supple, no palpable cervical or supraclavicular lymphadenopathy. Heart: Regular in rate and rhythm with no murmurs, rubs, or gallops. Chest: Clear to auscultation bilaterally, with no rhonchi, wheezes, or rales. Abdomen: Soft, nontender, nondistended, with no rigidity or guarding. Extremities +ankle edema. Lymphatics: see Neck Exam Skin: No concerning lesions. Musculoskeletal: symmetric strength and muscle tone throughout. Neurologic: Cranial nerves II through XII are grossly intact. No obvious focalities. Speech is fluent. Coordination is intact. Psychiatric: Judgment and insight are intact. Affect is appropriate. Breasts: post surgical bruising throughout right breast. No other palpable masses appreciated in the breasts or axillae.   ECOG = 1  0 - Asymptomatic (Fully active, able to carry on all predisease activities without restriction)  1 - Symptomatic but completely ambulatory (Restricted in physically strenuous activity but ambulatory and able to carry out work of a light or sedentary nature. For example, light housework,  office work)  2 - Symptomatic, <50% in bed during the day (Ambulatory and capable of all self care but unable to carry out any work activities. Up and about more than 50% of waking hours)  3 - Symptomatic, >50% in bed, but not bedbound (Capable of only limited self-care, confined to bed or chair 50% or more of waking hours)  4 - Bedbound (Completely disabled. Cannot carry on any self-care. Totally confined to bed or chair)  5 - Death   Eustace Pen MM, Creech RH, Tormey DC, et al. [phone]). "Toxicity and response criteria of the [facility]". [place] Oncol. 5 (6): 649-55   LABORATORY DATA:  Lab Results  Component Value Date   WBC 6.1 12/07/2014   HGB 13.5 12/07/2014   HCT 41.4 12/07/2014   MCV 91.4 12/07/2014   PLT 233 12/07/2014   CMP     Component Value Date/Time   NA 138 05/17/2017 1245   K 3.8 05/17/2017 1245   CL 102 05/17/2017 1245   CO2 27 05/17/2017 1245   GLUCOSE 80 05/17/2017 1245   BUN 22 (H) 05/17/2017 1245   CREATININE 0.95 05/17/2017 1245   CALCIUM 9.8 05/17/2017 1245   PROT 6.2 02/16/2014 0556   ALBUMIN 3.3 (L) 02/16/2014 0556   AST 14 02/16/2014 0556   ALT 19 02/16/2014 0556   ALKPHOS 72 02/16/2014  0556   BILITOT 0.4 02/16/2014 0556   GFRNONAA 59 (L) 05/17/2017 1245   GFRAA >60 05/17/2017 1245         RADIOGRAPHY: Nm Sentinel Node Inj-no Rpt (breast)  Result Date: 05/20/2017 Sulfur colloid was injected by the nuclear medicine technologist for melanoma sentinel node.   Mm Breast Surgical Specimen  Result Date: 05/20/2017 CLINICAL DATA:  Right lumpectomy for invasive ductal carcinoma in the upper outer quadrant of the breast. EXAM: SPECIMEN RADIOGRAPH OF THE RIGHT BREAST COMPARISON:  Previous exam(s). FINDINGS: Status post excision of the right breast. The X shaped biopsy marker clip, radioactive seed and spiculated mass are present, completely intact, and were marked for pathology. IMPRESSION: Specimen radiograph of the right  breast. Electronically Signed   By: Claudie Revering M.D.   On: 05/20/2017 11:48   Mm Rt Radioactive Seed Loc Mammo Guide  Result Date: 05/20/2017 CLINICAL DATA:  Patient for preoperative localization prior to right lumpectomy. The X shaped marking clip was localized at the site of recently diagnosed right breast ductal carcinoma. EXAM: MAMMOGRAPHIC GUIDED RADIOACTIVE SEED LOCALIZATION OF THE RIGHT BREAST COMPARISON:  Previous exam(s). FINDINGS: Patient presents for radioactive seed localization prior to right breast lumpectomy. I met with the patient and we discussed the procedure of seed localization including benefits and alternatives. We discussed the high likelihood of a successful procedure. We discussed the risks of the procedure including infection, bleeding, tissue injury and further surgery. We discussed the low dose of radioactivity involved in the procedure. Informed, written consent was given. The usual time-out protocol was performed immediately prior to the procedure. Using mammographic guidance, sterile technique, 1% lidocaine and an I-125 radioactive seed, X shaped marking clip was localized using a cranial approach. The follow-up mammogram images confirm the seed in the expected location and were marked for Dr. Donne Hazel. Follow-up survey of the patient confirms presence of the radioactive seed. Order number of I-125 seed:  [id number]. Total activity:  7.829 millicuries  Reference Date: 04/25/2017 The patient tolerated the procedure well and was released from the [place]. She was given instructions regarding seed removal. IMPRESSION: Radioactive seed localization right breast. No apparent complications. Electronically Signed   By: Lovey Newcomer M.D.   On: 05/20/2017 08:33      IMPRESSION/PLAN: right breast cancer   For the patient's early stage favorable risk breast cancer, we had a thorough discussion about her options for adjuvant therapy. One option would be antiestrogen therapy as  discussed with medical oncology. She would take a pill for approximately 5 years. The alternative option (but less standard) would be radiotherapy to the breast. The most aggressive option would be to pursue both modalities.  Of note, I discussed the data from the [employer] al trial in the [place] of [employer]. She understands that tamoxifen compared to radiation plus tamoxifen demonstrated no survival benefit among the women in this study. The women were 59 years or older with stage I estrogen receptor positive breast cancer. Based on this study, I told the patient that her overall life expectancy should not be affected by adding radiotherapy to antiestrogen medication. She understands that the main benefit of  adding radiotherapy to anti estrogen therapy would be a very small but measurable local control benefit (risk of local recurrence to be lowered from ~10% --> ~2% over a decade).  We discussed the fact that radiotherapy only provides a local control benefit while anti-estrogen pills provide systemic coverage.   It was a pleasure meeting the  patient today. We discussed the risks, benefits, and side effects of radiotherapy.  She would like to pursue this, in addition to anti estrogen therapy. We discussed that radiation would take approximately 4 weeks  to complete and that I would give the patient a few weeks to heal following surgery before starting treatment planning. We spoke about acute effects including skin irritation and fatigue as well as much less common late effects including internal organ injury or irritation. We spoke about the latest technology that is used to minimize the risk of late effects for patients undergoing radiotherapy to the breast or chest wall. No guarantees of treatment were given. The patient is enthusiastic about proceeding with treatment.  We will schedule CT simulation for next week. Consent signed.  She is scheduled for follow up with medical oncology at  [facility] on December 6. She strongly prefers to have cancer therapy in [place] versus [place]. She is also scheduled for genetics consultation next week.    __________________________________________   Eppie Gibson, MD   This document serves as a record of services personally performed by Eppie Gibson, MD. It was created on his behalf by Linward Natal, a trained medical scribe. The creation of this record is based on the scribe's personal observations and the provider's statements to them. This document has been checked and approved by the attending provider.

## 2017-06-12 DIAGNOSIS — Z1339 Encounter for screening examination for other mental health and behavioral disorders: Secondary | ICD-10-CM | POA: Diagnosis not present

## 2017-06-12 DIAGNOSIS — Z299 Encounter for prophylactic measures, unspecified: Secondary | ICD-10-CM | POA: Diagnosis not present

## 2017-06-12 DIAGNOSIS — Z1331 Encounter for screening for depression: Secondary | ICD-10-CM | POA: Diagnosis not present

## 2017-06-12 DIAGNOSIS — R5383 Other fatigue: Secondary | ICD-10-CM | POA: Diagnosis not present

## 2017-06-12 DIAGNOSIS — Z6838 Body mass index (BMI) 38.0-38.9, adult: Secondary | ICD-10-CM | POA: Diagnosis not present

## 2017-06-12 DIAGNOSIS — Z1211 Encounter for screening for malignant neoplasm of colon: Secondary | ICD-10-CM | POA: Diagnosis not present

## 2017-06-12 DIAGNOSIS — Z7189 Other specified counseling: Secondary | ICD-10-CM | POA: Diagnosis not present

## 2017-06-12 DIAGNOSIS — Z0000 Encounter for general adult medical examination without abnormal findings: Secondary | ICD-10-CM | POA: Diagnosis not present

## 2017-06-12 DIAGNOSIS — E559 Vitamin D deficiency, unspecified: Secondary | ICD-10-CM | POA: Diagnosis not present

## 2017-06-12 DIAGNOSIS — Z1231 Encounter for screening mammogram for malignant neoplasm of breast: Secondary | ICD-10-CM | POA: Diagnosis not present

## 2017-06-12 DIAGNOSIS — Z79899 Other long term (current) drug therapy: Secondary | ICD-10-CM | POA: Diagnosis not present

## 2017-06-12 DIAGNOSIS — E7800 Pure hypercholesterolemia, unspecified: Secondary | ICD-10-CM | POA: Diagnosis not present

## 2017-06-13 ENCOUNTER — Encounter (HOSPITAL_COMMUNITY): Payer: Medicare Other | Attending: Oncology | Admitting: Oncology

## 2017-06-13 ENCOUNTER — Other Ambulatory Visit: Payer: Self-pay

## 2017-06-13 ENCOUNTER — Encounter (HOSPITAL_COMMUNITY): Payer: Self-pay

## 2017-06-13 VITALS — BP 129/54 | HR 65 | Temp 97.9°F | Resp 18 | Wt 233.7 lb

## 2017-06-13 DIAGNOSIS — Z170 Estrogen receptor positive status [ER+]: Secondary | ICD-10-CM

## 2017-06-13 DIAGNOSIS — C50411 Malignant neoplasm of upper-outer quadrant of right female breast: Secondary | ICD-10-CM | POA: Diagnosis not present

## 2017-06-13 NOTE — Progress Notes (Signed)
[facility] Initial Visit:  Patient Care Team: Monico Blitz, MD as PCP - General (Internal Medicine)  CHIEF COMPLAINTS/PURPOSE OF CONSULTATION:  Stage IA  (pT1pN0M0 Right Breast UOQ Grade I Invasive Ductal Carcinoma, ER 100% +, PR 90%+, Her2 Negative    HISTORY OF PRESENTING ILLNESS: Marissa BUONOCORE 71 y.o. female presents today for evaluation of her ER PR positive HER-2 negative stage IA right breast cancer with her daughter. Patient did not palpate a mass on self exam. Mass was found on mammogram.  Oncologic history as follows:  05/09/17: Right unilateral mammogram with TOMO: Spiculated mass within the upper right breast, 11 o'clock axis region, for which stereotactic-guided biopsy with 3D tomosynthesis will be performed later today. 2. Suspicious calcifications within the upper-outer quadrant of the right breast for which stereotactic guided biopsy will be performed later today. 3. No additional architectural distortion is identified within the right breast.  05/09/17: 1. Breast, right, needle core biopsy, upper outer quadrant, posterior depth: FIBROADENOMA WITH CALCIFICATIONS. FIBROCYSTIC CHANGES WITH CALCIFICATIONS. THERE IS NO EVIDENCE OF MALIGNANCY. 2. Breast, right, needle core biopsy, upper outer quadrant, middle depth: INVASIVE DUCTAL CARCINOMA. ER/PR positive, HER negative, Ki67 5%.  05/20/17: Right breast lumpectomy with sentinel node biopsy performed by Dr. Donne Hazel. Surgical path: invasive ductal carcinoma, Grade 1, spanning 1.4 cm, 0/2 SLN positive, all margins were negative by at least 0.3 cm. pT1c pN0 cM0.   06/03/17: Oncotype Dx: recurrence score   06/11/17: Consultation with Dr. Isidore Moos regarding adjuvant breast RT. 4 weeks of RT planned.   Today patient states that she is doing well.  Her breast is healing.  She still has some Steri-Strips at the site of the lumpectomy.  She denies any chest pain, shortness of breath, headache, blurry vision,  abdominal pain, nausea, vomiting, diarrhea, appetite change, weight loss, focal weakness.  Review of Systems - Oncology ROS as per HPI otherwise 12 point ROS is negative.  MEDICAL HISTORY: Past Medical History:  Diagnosis Date   Anxiety    Cancer ([place]) 05/2017   right breast cancer   GERD (gastroesophageal reflux disease)    High cholesterol    Hypertension     SURGICAL HISTORY: Past Surgical History:  Procedure Laterality Date   CARDIAC CATHETERIZATION     2008- negative findings   CATARACT EXTRACTION W/PHACO Right 09/06/2014   Procedure: CATARACT EXTRACTION PHACO AND INTRAOCULAR LENS PLACEMENT; CDE:  3.85;  Surgeon: Williams Che, MD;  [facility];  Service: Ophthalmology;  Laterality: Right;   CATARACT EXTRACTION W/PHACO Left 12/13/2014   Procedure: CATARACT EXTRACTION PHACO AND INTRAOCULAR LENS PLACEMENT LEFT EYE CDE=10.79;  Surgeon: Williams Che, MD;  Location: [facility];  Service: Ophthalmology;  Laterality: Left;   ESOPHAGOGASTRODUODENOSCOPY N/A 05/05/2014   Procedure: ESOPHAGOGASTRODUODENOSCOPY (EGD);  Surgeon: Rogene Houston, MD;  [facility];  Service: Endoscopy;  Laterality: N/A;  255   EUS N/A 06/10/2014   Procedure: UPPER ENDOSCOPIC ULTRASOUND (EUS) LINEAR;  Surgeon: Milus Banister, MD;  [facility];  Service: Endoscopy;  Laterality: N/A;   RADIOACTIVE SEED GUIDED PARTIAL MASTECTOMY WITH AXILLARY SENTINEL LYMPH NODE BIOPSY Right 05/20/2017   Procedure: RIGHT BREAST LUMPECTOMY WITH RADIOACTIVE SEED AND SENTINEL LYMPH NODE BIOPSY;  Surgeon: Rolm Bookbinder, MD;  Location: [facility];  Service: General;  Laterality: Right;   TUBAL LIGATION      SOCIAL HISTORY: Social History   Socioeconomic History   Marital status: Married    Spouse name: Not on file  Number of children: Not on file   Years of education: Not on file   Highest education level: Not on file  Social Needs   Financial resource strain:  Not on file   Food insecurity - worry: Not on file   Food insecurity - inability: Not on file   Transportation needs - medical: Not on file   Transportation needs - non-medical: Not on file  Occupational History   Occupation: retired  Tobacco Use   Smoking status: Never Smoker   Smokeless tobacco: Never Used  Substance and Sexual Activity   Alcohol use: No   Drug use: No   Sexual activity: Yes    Birth control/protection: Post-menopausal  Other Topics Concern   Not on file  Social History Narrative   Not on file    FAMILY HISTORY Family History  Problem Relation Age of Onset   CAD Mother 7    ALLERGIES:  is allergic to nitrostat [nitroglycerin].  MEDICATIONS:  Current Outpatient Medications  Medication Sig Dispense Refill   amLODipine (NORVASC) 10 MG tablet Take 10 mg by mouth daily.     aspirin EC 81 MG tablet Take 81 mg by mouth daily.     cholecalciferol (VITAMIN D) 1000 units tablet Take 2,000 Units by mouth daily.      clonazePAM (KLONOPIN) 0.5 MG tablet Take 0.5 mg 2 (two) times daily as needed by mouth for anxiety.     co-enzyme Q-10 30 MG capsule Take 100 mg by mouth daily.     HYDROcodone-acetaminophen (NORCO) 10-325 MG tablet Take 1 tablet every 6 (six) hours as needed by mouth. (Patient not taking: Reported on 06/11/2017) 10 tablet 0   losartan-hydrochlorothiazide (HYZAAR) 100-25 MG per tablet Take 1 tablet by mouth every morning.      metoprolol succinate (TOPROL-XL) 25 MG 24 hr tablet Take 25 mg by mouth every morning.      Omega-3 Fatty Acids (OMEGA 3 500) 500 MG CAPS Take by mouth.     omeprazole (PRILOSEC) 20 MG capsule Take 20 mg by mouth daily.     No current facility-administered medications for this visit.     PHYSICAL EXAMINATION:  ECOG PERFORMANCE STATUS: 0 - Asymptomatic   Vitals:   06/13/17 0903  BP: (!) 129/54  Pulse: 65  Resp: 18  Temp: 97.9 F (36.6 C)  SpO2: 100%    Filed Weights   06/13/17 0903  Weight:  233 lb 11.2 oz (106 kg)     Physical Exam Constitutional: Well-developed, well-nourished, and in no distress.   HENT:  Head: Normocephalic and atraumatic.  Mouth/Throat: No oropharyngeal exudate. Mucosa moist. Eyes: Pupils are equal, round, and reactive to light. Conjunctivae are normal. No scleral icterus.  Neck: Normal range of motion. Neck supple. No JVD present.  Cardiovascular: Normal rate, regular rhythm and normal heart sounds.  Exam reveals no gallop and no friction rub.   No murmur heard. Pulmonary/Chest: Effort normal and breath sounds normal. No respiratory distress. No wheezes.No rales.  Abdominal: Soft. Bowel sounds are normal. No distension. There is no tenderness. There is no guarding.  Musculoskeletal: No edema or tenderness.  Lymphadenopathy:    No cervical or supraclavicular adenopathy.  Neurological: Alert and oriented to person, place, and time. No cranial nerve deficit.  Skin: Skin is warm and dry. No rash noted. No erythema. No pallor.  Psychiatric: Affect and judgment normal.   Breast:  Large dense breasts bilateraly. Left breast without masses, nipple discharge, or axillary lymphadenopathy. Right breast with steristrips  in 12 o'clock position, right axillary incision site healed, no masses palpated, no nipple discharge, no axillary lymphadenopathy.  LABORATORY DATA: I have personally reviewed the data as listed:  Admission on 05/20/2017, Discharged on 05/20/2017  Component Date Value Ref Range Status   Sodium 05/17/2017 138  135 - 145 mmol/L Final   Potassium 05/17/2017 3.8  3.5 - 5.1 mmol/L Final   Chloride 05/17/2017 102  101 - 111 mmol/L Final   CO2 05/17/2017 27  22 - 32 mmol/L Final   Glucose, Bld 05/17/2017 80  65 - 99 mg/dL Final   BUN 05/17/2017 22* 6 - 20 mg/dL Final   Creatinine, Ser 05/17/2017 0.95  0.44 - 1.00 mg/dL Final   Calcium 05/17/2017 9.8  8.9 - 10.3 mg/dL Final   GFR calc non Af Amer 05/17/2017 59* >60 mL/min Final   GFR calc  Af Amer 05/17/2017 >60  >60 mL/min Final   Comment: (NOTE) The eGFR has been calculated using the CKD EPI equation. This calculation has not been validated in all clinical situations. eGFR's persistently <60 mL/min signify possible Chronic Kidney Disease.    Anion gap 05/17/2017 9  5 - 15 Final    RADIOGRAPHIC STUDIES: I have personally reviewed the radiological images as listed and agree with the findings in the report  PATHOLOGY: Patient: BRENLEE, KOSKELA Collected: 05/20/2017 Client: [facility] Accession: [id number] Received: 05/20/2017 Rolm Bookbinder DOB: [birth year] Age: 68 Gender: F Reported: 05/22/2017 [address] Patient Ph: [phone] MRN #: [id number] [place], [place] [address] Visit #: [id number].[id number] Chart #: Phone:  Fax: CC: REPORT OF SURGICAL PATHOLOGY FINAL DIAGNOSIS Diagnosis 1. Breast, lumpectomy, Right - INVASIVE DUCTAL CARCINOMA, GRADE 1, SPANNING 1.4 CM. - RESECTION MARGINS ARE NEGATIVE FOR CARCINOMA. - BIOPSY SITE CHANGES. - SEE ONCOLOGY TABLE. 2. Lymph node, sentinel, biopsy, Right - ONE OF ONE LYMPH NODES NEGATIVE FOR CARCINOMA (0/1). 3. Lymph node, sentinel, biopsy, Right - ONE OF ONE LYMPH NODES NEGATIVE FOR CARCINOMA (0/1). Microscopic Comment 1. BREAST, INVASIVE TUMOR Procedure: Right breast lumpectomy with right sentinel lymph node biopsies. Laterality: Right. Tumor Size: 1.4 cm. Histologic Type: Invasive ductal carcinoma. Grade: 1 Tubular Differentiation: 1 Nuclear Pleomorphism: 1 Mitotic Count: 1 Ductal Carcinoma in Situ (DCIS): Not identified. Extent of Tumor: Confined to breast parenchyma. Margins: Invasive carcinoma, distance from closest margin: 0.3 cm posterior focal. DCIS, distance from closest margin: N/A. Regional Lymph Nodes: Number of Lymph Nodes Examined: 2 Number of Sentinel Lymph Nodes Examined: 2 Lymph Nodes with Macrometastases: 0 Lymph Nodes with Micrometastases: 0 Lymph Nodes with Isolated  Tumor Cells: 0 Breast Prognostic Profile: Performed on biopsy [id number], see below. Will not be repeated. 1 of 3 FINAL for LAKIAH, DHINGRA ([id number]) Microscopic Comment(continued) Estrogen Receptor: Positive, 100% strong staining. Progesterone Receptor: Positive, 90% strong staining. Her2: Negative (ratio 1.08). Ki-67: 5% Best tumor block for sendout testing: 1E Pathologic Stage Classification (pTNM, AJCC 8th Edition): Primary Tumor (pT): pT1c Regional Lymph Nodes (pN): pN0 Distant Metastases (pM): pMX Comments: None. Vicente Males MD Pathologist, Electronic Signature (Case signed 05/22/2017)  ASSESSMENT/PLAN Cancer Staging Carcinoma of upper-outer quadrant of right breast in female, estrogen receptor positive ([place]) Staging form: Breast, AJCC 8th Edition - Clinical: cT1c, cN0 - Unsigned - Pathologic: Stage IA (pT1c, pN0, cM0, G1, ER: Positive, PR: Positive, HER2: Negative, Oncotype DX score: 11) - Signed by Twana First, MD on 06/13/2017  -Review surgical path in detail with the patient and her daughter today. -Proceed with adjuvant radiation to right breast as scheduled  with Dr. Isidore Moos.  She will need to undergo CT simulation.  Total 4 weeks of radiation is planned. -Reviewed her Oncotype DX score with her in detail.  Discussed that since her recurrence score is less than 31, she will not need adjuvant chemotherapy. -Once she completes radiation, we will see patient again for follow-up to discuss adjuvant endocrine therapy with aromatase inhibitor.  Patient states she had a baseline DEXA scan performed by her PCP last year which was normal. -Return to clinic in 6 weeks for follow-up with CBC, CMP.  All questions were answered. The patient knows to call the clinic with any problems, questions or concerns.  This note was electronically signed.    Twana First, MD  06/13/2017 8:44 AM

## 2017-06-13 NOTE — Patient Instructions (Signed)
[facility] at [facility] Discharge Instructions  RECOMMENDATIONS MADE BY THE CONSULTANT AND ANY TEST RESULTS WILL BE SENT TO YOUR REFERRING PHYSICIAN.  You were seen today by Dr. Louise Zhou    Thank you for choosing [facility] at [facility] to provide your oncology and hematology care.  To afford each patient quality time with our provider, please arrive at least 15 minutes before your scheduled appointment time.    If you have a lab appointment with the Cancer Center please come in thru the  Main Entrance and check in at the main information desk  You need to re-schedule your appointment should you arrive 10 or more minutes late.  We strive to give you quality time with our providers, and arriving late affects you and other patients whose appointments are after yours.  Also, if you no show three or more times for appointments you may be dismissed from the clinic at the providers discretion.     Again, thank you for choosing [facility].  Our hope is that these requests will decrease the amount of time that you wait before being seen by our physicians.       _____________________________________________________________  Should you have questions after your visit to [facility], please contact our office at [phone] between the hours of 8:30 a.m. and 4:30 p.m.  Voicemails left after 4:30 p.m. will not be returned until the following business day.  For prescription refill requests, have your pharmacy contact our office.       Resources For Cancer Patients and their Caregivers ? American Cancer Society: Can assist with transportation, wigs, general needs, runs Look Good Feel Better.        1-888-227-6333 ? Cancer Care: Provides financial assistance, online support groups, medication/co-pay assistance.  1-800-813-HOPE (4673) ? [facility] Assists [place] cancer patients and their  families through emotional , educational and financial support.  336-427-4357 ? [place] DSS Where to apply for food stamps, Medicaid and utility assistance. 336-342-1394 ? [facility]: Transportation to medical appointments. 336-347-2287 ? Social Security Administration: May apply for disability if have a Stage IV cancer. 336-342-7796 1-800-772-1213 ? [place] Aging, Disability and Transit Services: Assists with nutrition, care and transit needs. 336-349-2343  Cancer Center Support Programs: [email or web address]@ > Cancer Support Group  2nd Tuesday of the month 1pm-2pm, Journey Room  > Creative Journey  3rd Tuesday of the month 1130am-1pm, Journey Room  > Look Good Feel Better  1st Wednesday of the month 10am-12 noon, Journey Room (Call American Cancer Society to register 1-800-395-5775)    

## 2017-06-20 ENCOUNTER — Other Ambulatory Visit (HOSPITAL_COMMUNITY): Payer: Medicare Other

## 2017-06-20 ENCOUNTER — Encounter (HOSPITAL_COMMUNITY): Payer: Medicare Other | Admitting: Genetic Counselor

## 2017-06-20 ENCOUNTER — Encounter (HOSPITAL_COMMUNITY): Payer: Self-pay

## 2017-06-21 ENCOUNTER — Ambulatory Visit
Admission: RE | Admit: 2017-06-21 | Discharge: 2017-06-21 | Disposition: A | Discharge status: Home / Self Care | Payer: Medicare Other | Source: Ambulatory Visit | Attending: Radiation Oncology | Admitting: Radiation Oncology

## 2017-06-21 DIAGNOSIS — K219 Gastro-esophageal reflux disease without esophagitis: Secondary | ICD-10-CM | POA: Diagnosis not present

## 2017-06-21 DIAGNOSIS — Z79899 Other long term (current) drug therapy: Secondary | ICD-10-CM | POA: Diagnosis not present

## 2017-06-21 DIAGNOSIS — Z510 Encounter for antineoplastic radiation therapy: Secondary | ICD-10-CM | POA: Diagnosis not present

## 2017-06-21 DIAGNOSIS — Z170 Estrogen receptor positive status [ER+]: Secondary | ICD-10-CM | POA: Diagnosis not present

## 2017-06-21 DIAGNOSIS — C50411 Malignant neoplasm of upper-outer quadrant of right female breast: Secondary | ICD-10-CM

## 2017-06-21 DIAGNOSIS — F419 Anxiety disorder, unspecified: Secondary | ICD-10-CM | POA: Diagnosis not present

## 2017-06-21 NOTE — Progress Notes (Signed)
  Radiation Oncology         [phone] ________________________________  Name: Marissa [name] MRN: [id number]  Date: 06/21/2017  DOB: 02/16/1946  SIMULATION AND TREATMENT PLANNING NOTE    Outpatient  DIAGNOSIS:     ICD-10-CM   1. Carcinoma of upper-outer quadrant of right breast in female, estrogen receptor positive ([place]) C50.411    Z17.0     NARRATIVE:  The patient was brought to the [facility] [place].  Identity was confirmed.  All relevant records and images related to the planned course of therapy were reviewed.  The patient freely provided informed written consent to proceed with treatment after reviewing the details related to the planned course of therapy. The consent form was witnessed and verified by the simulation staff.    Then, the patient was set-up in a stable reproducible supine position for radiation therapy with her ipsilateral arm over her head, and her upper body secured in a custom-made Vac-lok device.  CT images were obtained.  Surface markings were placed.  The CT images were loaded into the planning software.    TREATMENT PLANNING NOTE: Treatment planning then occurred.  The radiation prescription was entered and confirmed.     A total of 3 medically necessary complex treatment devices were fabricated and supervised by me: 2 fields with MLCs for custom blocks to protect heart, and lungs;  and, a Vac-lok. MORE COMPLEX DEVICES MAY BE MADE IN DOSIMETRY FOR FIELD IN FIELD BEAMS FOR DOSE HOMOGENEITY.  I have requested : 3D Simulation which is medically necessary to give adequate dose to at risk tissues while sparing lungs and heart.  I have requested a DVH of the following structures: lungs, heart, right lumpectomy cavity.    The patient will receive 40.05 Gy in 15 fractions to the right breast with 2 tangential fields.   This will  be followed by a boost.  Optical Surface Tracking Plan:  Since intensity modulated radiotherapy (IMRT) and 3D conformal  radiation treatment methods are predicated on accurate and precise positioning for treatment, intrafraction motion monitoring is medically necessary to ensure accurate and safe treatment delivery. The ability to quantify intrafraction motion without excessive ionizing radiation dose can only be performed with optical surface tracking. Accordingly, surface imaging offers the opportunity to obtain 3D measurements of patient position throughout IMRT and 3D treatments without excessive radiation exposure. I am ordering optical surface tracking for this patient's upcoming course of radiotherapy.  ________________________________   Reference:  Ursula Alert, J, et al. Surface imaging-based analysis of intrafraction motion for breast radiotherapy patients.Journal of [place], n. 6, nov. 2014. ISSN [id number].  Available at: <http://www.jacmp.org/index.php/jacmp/article/view/4957>.    -----------------------------------  Eppie Gibson, MD

## 2017-06-25 DIAGNOSIS — Z510 Encounter for antineoplastic radiation therapy: Secondary | ICD-10-CM | POA: Diagnosis not present

## 2017-06-25 DIAGNOSIS — Z170 Estrogen receptor positive status [ER+]: Secondary | ICD-10-CM | POA: Diagnosis not present

## 2017-06-25 DIAGNOSIS — K219 Gastro-esophageal reflux disease without esophagitis: Secondary | ICD-10-CM | POA: Diagnosis not present

## 2017-06-25 DIAGNOSIS — F419 Anxiety disorder, unspecified: Secondary | ICD-10-CM | POA: Diagnosis not present

## 2017-06-25 DIAGNOSIS — Z79899 Other long term (current) drug therapy: Secondary | ICD-10-CM | POA: Diagnosis not present

## 2017-06-25 DIAGNOSIS — C50411 Malignant neoplasm of upper-outer quadrant of right female breast: Secondary | ICD-10-CM | POA: Diagnosis not present

## 2017-06-27 ENCOUNTER — Other Ambulatory Visit (HOSPITAL_COMMUNITY): Payer: Medicare Other

## 2017-06-27 ENCOUNTER — Encounter (HOSPITAL_COMMUNITY): Payer: Self-pay | Admitting: Genetic Counselor

## 2017-06-27 ENCOUNTER — Encounter (HOSPITAL_BASED_OUTPATIENT_CLINIC_OR_DEPARTMENT_OTHER): Payer: Medicare Other | Admitting: Genetic Counselor

## 2017-06-27 DIAGNOSIS — Z8041 Family history of malignant neoplasm of ovary: Secondary | ICD-10-CM

## 2017-06-27 DIAGNOSIS — C50411 Malignant neoplasm of upper-outer quadrant of right female breast: Secondary | ICD-10-CM | POA: Diagnosis not present

## 2017-06-27 DIAGNOSIS — Z803 Family history of malignant neoplasm of breast: Secondary | ICD-10-CM | POA: Diagnosis not present

## 2017-06-27 DIAGNOSIS — Z170 Estrogen receptor positive status [ER+]: Secondary | ICD-10-CM | POA: Diagnosis not present

## 2017-06-27 DIAGNOSIS — Z800 Family history of malignant neoplasm of digestive organs: Secondary | ICD-10-CM | POA: Diagnosis not present

## 2017-06-27 NOTE — Progress Notes (Signed)
REFERRING PROVIDER: Twana First, MD [place], [place] [address]  PRIMARY PROVIDER:  Monico Blitz, MD  PRIMARY REASON FOR VISIT:  1. Carcinoma of upper-outer quadrant of right breast in female, estrogen receptor positive ([place])   2. Family history of breast cancer   3. Family history of ovarian cancer   4. Family history of stomach cancer      HISTORY OF PRESENT ILLNESS:   Marissa [name], a 71 y.o. female, was seen for a [facility] cancer genetics consultation at the request of Dr. Talbert Cage due to a personal and family history of cancer.  Marissa [name] presents to clinic today to discuss the possibility of a hereditary predisposition to cancer, genetic testing, and to further clarify her future cancer risks, as well as potential cancer risks for family members.   In 2018, at the age of 43, Marissa [name] was diagnosed with invasive ductal carcinoma of the right breast.  The tumor is ER+/PR+/Her2-. This was treated with lumpectomy and radiation.  She will take tamoxifen for 5 years.  Marissa [name] was seen with her daughter who had breast cancer 6 years ago.  Her daughter underwent genetic testing for BRCA mutations and was negative at that time (2013).     CANCER HISTORY:   No history exists.     HORMONAL RISK FACTORS:  Menarche was at age 85.  First live birth at age 89.  OCP use for approximately 1 years.  Ovaries intact: yes.  Hysterectomy: no.  Menopausal status: postmenopausal.  HRT use: 0 years. Colonoscopy: yes; normal. Mammogram within the last year: yes. Number of breast biopsies: 1. Up to date with pelvic exams:  yes. Any excessive radiation exposure in the past:  no  Past Medical History:  Diagnosis Date  . Anxiety   . Cancer ([place]) 05/2017   right breast cancer  . Family history of breast cancer   . Family history of ovarian cancer   . Family history of stomach cancer   . GERD (gastroesophageal reflux disease)   . High cholesterol   . Hypertension     Past Surgical  History:  Procedure Laterality Date  . CARDIAC CATHETERIZATION     2008- negative findings  . CATARACT EXTRACTION W/PHACO Right 09/06/2014   Procedure: CATARACT EXTRACTION PHACO AND INTRAOCULAR LENS PLACEMENT; CDE:  3.85;  Surgeon: Williams Che, MD;  Location: [facility];  Service: Ophthalmology;  Laterality: Right;  . CATARACT EXTRACTION W/PHACO Left 12/13/2014   Procedure: CATARACT EXTRACTION PHACO AND INTRAOCULAR LENS PLACEMENT LEFT EYE CDE=10.79;  Surgeon: Williams Che, MD;  Location: [facility];  Service: Ophthalmology;  Laterality: Left;  . ESOPHAGOGASTRODUODENOSCOPY N/A 05/05/2014   Procedure: ESOPHAGOGASTRODUODENOSCOPY (EGD);  Surgeon: Rogene Houston, MD;  Location: [facility];  Service: Endoscopy;  Laterality: N/A;  255  . EUS N/A 06/10/2014   Procedure: UPPER ENDOSCOPIC ULTRASOUND (EUS) LINEAR;  Surgeon: Milus Banister, MD;  Location: [facility];  Service: Endoscopy;  Laterality: N/A;  . RADIOACTIVE SEED GUIDED PARTIAL MASTECTOMY WITH AXILLARY SENTINEL LYMPH NODE BIOPSY Right 05/20/2017   Procedure: RIGHT BREAST LUMPECTOMY WITH RADIOACTIVE SEED AND SENTINEL LYMPH NODE BIOPSY;  Surgeon: Rolm Bookbinder, MD;  Location: [facility];  Service: General;  Laterality: Right;  . TUBAL LIGATION      Social History   Socioeconomic History  . Marital status: Married    Spouse name: Not on file  . Number of children: 2  . Years of education: Not on file  .  Highest education level: Not on file  Social Needs  . Financial resource strain: Not on file  . Food insecurity - worry: Not on file  . Food insecurity - inability: Not on file  . Transportation needs - medical: Not on file  . Transportation needs - non-medical: Not on file  Occupational History  . Occupation: retired  Tobacco Use  . Smoking status: Never Smoker  . Smokeless tobacco: Never Used  Substance and Sexual Activity  . Alcohol use: No  . Drug use: No  . Sexual activity: Yes    Birth  control/protection: Post-menopausal  Other Topics Concern  . Not on file  Social History Narrative  . Not on file     FAMILY HISTORY:  We obtained a detailed, 4-generation family history.  Significant diagnoses are listed below: Family History  Problem Relation Age of Onset  . CAD Mother 57  . Leukemia Father   . Parkinson's disease Father   . Breast cancer Daughter 31       BRCA neg  . Heart Problems Maternal Aunt   . Cancer Maternal Uncle        NOS  . Cancer Paternal Uncle        NOS  . Diabetes Maternal Grandfather   . Liver cancer Maternal Grandfather   . Cancer Paternal Grandmother        NOS  . Brain cancer Maternal Aunt   . Lung cancer Maternal Aunt        smoker  . Cancer Maternal Uncle        NOS  . Stomach cancer Maternal Uncle   . Lung cancer Maternal Uncle        smoker  . Breast cancer Daughter        BRCA neg  . Breast cancer Cousin        mat first cousin  . Breast cancer Cousin        mat first cousin dx under 49  . Breast cancer Cousin        2 additional mat first cousins  . Ovarian cancer Cousin        mat first cousin  . Cancer Cousin        several female mat first cousins with cancer NOS  . Breast cancer Cousin        2 pat first cousins  . Cancer Cousin        3 pat first cousins with cancer NOS    The patient has two daughters.  Her oldest daughter was diagnosed with breast cancer at age 28.  She underwent genetic testing in 2013 and is negative for BRCA mutations.  The other daughter is cancer free.  Reportedly she also underwent genetic testing for BRCA mutations and is reportedly negative.  The patient's parents are deceased.  The patient's mother died in her mid 71's from heart issues.  She had four brothers and three sisters.  Two brothers had unknown cancers, one who had a son who died of an unknown cancer and the other had a son who died of lung cancer.  Another brother had stomach cancer, and had three daughters who had breast cancer  and one daughter who had ovarian cancer.  The last brother had lung cancer.  One sister died of heart problems, and had a son who died of an unknown cancer.  A second sister had a brain cancer, and has a granddaughter who had breast cancer in her mid to late 64's.  The third sister is alive but had lung cancer.  She has a daughter who had breast cancer under 65.  The maternal grandparents are deceased.  The grandfather died of liver cancer and the grandmother died of old age at 38.  The patient's father had leukemia and died in his mid 54's.  He had two brothers and two sisters.  One sister is alive and no cancer.  The second sister did not have cancer but had a daughter who had breast cancer and died in her 60's.  One brother died of an unknown cancer.  He had 18 children, three of whom died recently of an unknown cancer.  The second brother had a daughter with breast cancer.  The paternal grandparents are deceased.  The grandfather died in a car accident and the grandmother died of an unknown cancer.  Patient's maternal ancestors are of caucasian descent, and paternal ancestors are of Caucasian descent. There is no reported Ashkenazi Jewish ancestry. There is no known consanguinity.  GENETIC COUNSELING ASSESSMENT: Marissa [name] is a 71 y.o. female with a personal and family history of breast cancer which is somewhat suggestive of a hereditary cancer syndrome and predisposition to cancer. We, therefore, discussed and recommended the following at today's visit.   DISCUSSION: We discussed that about 5-10% of breast cancer is hereditary, with most cases due to BRCA mutations.  The patient's daughter had breast cancer 6 years ago, which prompted both daughters to undergo genetic testing for BRCA mutations.  Both daughters tested negative.  Therefore, the likelihood that the patient will have a BRCA mutation has been lowered. She has an extensive family history of cancer, and therefore there is a higher  chance that there is something going on in the family.  Other breast cancer genes that are associated with an increased risk for cancer include ATM, CHEK2 and PALB2. We reviewed the characteristics, features and inheritance patterns of hereditary cancer syndromes. We also discussed genetic testing, including the appropriate family members to test, the process of testing, insurance coverage and turn-around-time for results. We discussed the implications of a negative, positive and/or variant of uncertain significant result. We recommended Marissa [name] pursue genetic testing for the Multi cancer gene panel. The Multi-Gene Panel offered by [facility] includes sequencing and/or deletion duplication testing of the following 83 genes: ALK, APC, ATM, AXIN2,BAP1,  BARD1, BLM, BMPR1A, BRCA1, BRCA2, BRIP1, CASR, CDC73, CDH1, CDK4, CDKN1B, CDKN1C, CDKN2A (p14ARF), CDKN2A (p16INK4a), CEBPA, CHEK2, CTNNA1, DICER1, DIS3L2, EGFR (c.2369C>T, p.Thr790Met variant only), EPCAM (Deletion/duplication testing only), FH, FLCN, GATA2, GPC3, GREM1 (Promoter region deletion/duplication testing only), HOXB13 (c.251G>A, p.Gly84Glu), HRAS, KIT, MAX, MEN1, MET, MITF (c.952G>A, p.Glu318Lys variant only), MLH1, MSH2, MSH3, MSH6, MUTYH, NBN, NF1, NF2, NTHL1, PALB2, PDGFRA, PHOX2B, PMS2, POLD1, POLE, POT1, PRKAR1A, PTCH1, PTEN, RAD50, RAD51C, RAD51D, RB1, RECQL4, RET, RUNX1, SDHAF2, SDHA (sequence changes only), SDHB, SDHC, SDHD, SMAD4, SMARCA4, SMARCB1, SMARCE1, STK11, SUFU, TERT, TERT, TMEM127, TP53, TSC1, TSC2, VHL, WRN and WT1.    Based on Marissa [name]'s personal and family history of cancer, she meets medical criteria for genetic testing. Despite that she meets criteria, she may still have an out of pocket cost. We discussed that if her out of pocket cost for testing is over $100, the laboratory will call and confirm whether she wants to proceed with testing.  If the out of pocket cost of testing is less than $100 she will be billed by the genetic  testing laboratory.   PLAN: After considering the risks, benefits, and  limitations, Marissa [name]  provided informed consent to pursue genetic testing and the blood sample was sent to [facility] for analysis of the multi cancer gene panel. Results should be available within approximately 2-3 weeks' time, at which point they will be disclosed by telephone to Marissa [name], as will any additional recommendations warranted by these results. Marissa [name] will receive a summary of her genetic counseling visit and a copy of her results once available. This information will also be available in Epic. We encouraged Marissa [name] to remain in contact with cancer genetics annually so that we can continuously update the family history and inform her of any changes in cancer genetics and testing that may be of benefit for her family. Marissa [name] questions were answered to her satisfaction today. Our contact information was provided should additional questions or concerns arise.  Lastly, we encouraged Marissa [name] to remain in contact with cancer genetics annually so that we can continuously update the family history and inform her of any changes in cancer genetics and testing that may be of benefit for this family.   Ms.  [name] questions were answered to her satisfaction today. Our contact information was provided should additional questions or concerns arise. Thank you for the referral and allowing [place] to share in the care of your patient.   Collier Bohnet P. Florene Glen, [place], [facility] Certified Genetic Counselor [email or web address] phone: [phone]  The patient was seen for a total of 45 minutes in face-to-face genetic counseling.  This patient was discussed with Drs. Magrinat, Lindi Adie and/or Burr Medico who agrees with the above.    _______________________________________________________________________ For Office Staff:  Number of people involved in session: 2 Was an Intern/ student involved with case: no

## 2017-06-28 ENCOUNTER — Ambulatory Visit
Admission: RE | Admit: 2017-06-28 | Discharge: 2017-06-28 | Disposition: A | Discharge status: Home / Self Care | Payer: Medicare Other | Source: Ambulatory Visit | Attending: Radiation Oncology | Admitting: Radiation Oncology

## 2017-06-28 DIAGNOSIS — K219 Gastro-esophageal reflux disease without esophagitis: Secondary | ICD-10-CM | POA: Diagnosis not present

## 2017-06-28 DIAGNOSIS — Z170 Estrogen receptor positive status [ER+]: Secondary | ICD-10-CM | POA: Diagnosis not present

## 2017-06-28 DIAGNOSIS — F419 Anxiety disorder, unspecified: Secondary | ICD-10-CM | POA: Diagnosis not present

## 2017-06-28 DIAGNOSIS — C50411 Malignant neoplasm of upper-outer quadrant of right female breast: Secondary | ICD-10-CM | POA: Diagnosis not present

## 2017-06-28 DIAGNOSIS — Z79899 Other long term (current) drug therapy: Secondary | ICD-10-CM | POA: Diagnosis not present

## 2017-06-28 DIAGNOSIS — Z510 Encounter for antineoplastic radiation therapy: Secondary | ICD-10-CM | POA: Diagnosis not present

## 2017-07-01 DIAGNOSIS — I10 Essential (primary) hypertension: Secondary | ICD-10-CM | POA: Diagnosis not present

## 2017-07-01 DIAGNOSIS — E7800 Pure hypercholesterolemia, unspecified: Secondary | ICD-10-CM | POA: Diagnosis not present

## 2017-07-03 ENCOUNTER — Ambulatory Visit
Admission: RE | Admit: 2017-07-03 | Discharge: 2017-07-03 | Disposition: A | Discharge status: Home / Self Care | Payer: Medicare Other | Source: Ambulatory Visit | Attending: Radiation Oncology | Admitting: Radiation Oncology

## 2017-07-03 DIAGNOSIS — K219 Gastro-esophageal reflux disease without esophagitis: Secondary | ICD-10-CM | POA: Diagnosis not present

## 2017-07-03 DIAGNOSIS — F419 Anxiety disorder, unspecified: Secondary | ICD-10-CM | POA: Diagnosis not present

## 2017-07-03 DIAGNOSIS — Z510 Encounter for antineoplastic radiation therapy: Secondary | ICD-10-CM | POA: Diagnosis not present

## 2017-07-03 DIAGNOSIS — C50411 Malignant neoplasm of upper-outer quadrant of right female breast: Secondary | ICD-10-CM | POA: Diagnosis not present

## 2017-07-03 DIAGNOSIS — Z170 Estrogen receptor positive status [ER+]: Secondary | ICD-10-CM | POA: Diagnosis not present

## 2017-07-03 DIAGNOSIS — Z79899 Other long term (current) drug therapy: Secondary | ICD-10-CM | POA: Diagnosis not present

## 2017-07-04 ENCOUNTER — Ambulatory Visit
Admission: RE | Admit: 2017-07-04 | Discharge: 2017-07-04 | Disposition: A | Discharge status: Home / Self Care | Payer: Medicare Other | Source: Ambulatory Visit | Attending: Radiation Oncology | Admitting: Radiation Oncology

## 2017-07-04 DIAGNOSIS — C50411 Malignant neoplasm of upper-outer quadrant of right female breast: Secondary | ICD-10-CM | POA: Diagnosis not present

## 2017-07-04 DIAGNOSIS — Z170 Estrogen receptor positive status [ER+]: Secondary | ICD-10-CM | POA: Diagnosis not present

## 2017-07-04 DIAGNOSIS — F419 Anxiety disorder, unspecified: Secondary | ICD-10-CM | POA: Diagnosis not present

## 2017-07-04 DIAGNOSIS — Z79899 Other long term (current) drug therapy: Secondary | ICD-10-CM | POA: Diagnosis not present

## 2017-07-04 DIAGNOSIS — K219 Gastro-esophageal reflux disease without esophagitis: Secondary | ICD-10-CM | POA: Diagnosis not present

## 2017-07-04 DIAGNOSIS — Z510 Encounter for antineoplastic radiation therapy: Secondary | ICD-10-CM | POA: Diagnosis not present

## 2017-07-05 ENCOUNTER — Ambulatory Visit
Admission: RE | Admit: 2017-07-05 | Discharge: 2017-07-05 | Disposition: A | Discharge status: Home / Self Care | Payer: Medicare Other | Source: Ambulatory Visit | Attending: Radiation Oncology | Admitting: Radiation Oncology

## 2017-07-05 DIAGNOSIS — F419 Anxiety disorder, unspecified: Secondary | ICD-10-CM | POA: Diagnosis not present

## 2017-07-05 DIAGNOSIS — Z79899 Other long term (current) drug therapy: Secondary | ICD-10-CM | POA: Diagnosis not present

## 2017-07-05 DIAGNOSIS — Z510 Encounter for antineoplastic radiation therapy: Secondary | ICD-10-CM | POA: Diagnosis not present

## 2017-07-05 DIAGNOSIS — Z170 Estrogen receptor positive status [ER+]: Secondary | ICD-10-CM | POA: Diagnosis not present

## 2017-07-05 DIAGNOSIS — C50411 Malignant neoplasm of upper-outer quadrant of right female breast: Secondary | ICD-10-CM | POA: Diagnosis not present

## 2017-07-05 DIAGNOSIS — K219 Gastro-esophageal reflux disease without esophagitis: Secondary | ICD-10-CM | POA: Diagnosis not present

## 2017-07-08 ENCOUNTER — Telehealth: Payer: Self-pay | Admitting: Genetic Counselor

## 2017-07-08 ENCOUNTER — Ambulatory Visit
Admission: RE | Admit: 2017-07-08 | Discharge: 2017-07-08 | Disposition: A | Discharge status: Home / Self Care | Payer: Medicare Other | Source: Ambulatory Visit | Attending: Radiation Oncology | Admitting: Radiation Oncology

## 2017-07-08 ENCOUNTER — Encounter: Payer: Self-pay | Admitting: Genetic Counselor

## 2017-07-08 DIAGNOSIS — Z510 Encounter for antineoplastic radiation therapy: Secondary | ICD-10-CM | POA: Diagnosis not present

## 2017-07-08 DIAGNOSIS — F419 Anxiety disorder, unspecified: Secondary | ICD-10-CM | POA: Diagnosis not present

## 2017-07-08 DIAGNOSIS — Z79899 Other long term (current) drug therapy: Secondary | ICD-10-CM | POA: Diagnosis not present

## 2017-07-08 DIAGNOSIS — Z1379 Encounter for other screening for genetic and chromosomal anomalies: Secondary | ICD-10-CM | POA: Insufficient documentation

## 2017-07-08 DIAGNOSIS — Z170 Estrogen receptor positive status [ER+]: Principal | ICD-10-CM

## 2017-07-08 DIAGNOSIS — C50411 Malignant neoplasm of upper-outer quadrant of right female breast: Secondary | ICD-10-CM | POA: Diagnosis not present

## 2017-07-08 DIAGNOSIS — K219 Gastro-esophageal reflux disease without esophagitis: Secondary | ICD-10-CM | POA: Diagnosis not present

## 2017-07-08 MED ORDER — ALRA NON-METALLIC DEODORANT (RAD-ONC)
1.0000 "application " | Freq: Once | TOPICAL | Status: AC
  Administered 2017-07-08: 1 via TOPICAL

## 2017-07-08 MED ORDER — RADIAPLEXRX EX GEL
Freq: Once | CUTANEOUS | Status: AC
  Administered 2017-07-08: 09:00:00 via TOPICAL

## 2017-07-08 NOTE — Telephone Encounter (Signed)
Revealed negative genetic testing.  Discussed that we do not know why she has breast cancer or why there is cancer in the family. It could be due to a different gene that we are not testing, or maybe our current technology may not be able to pick something up.  It will be important for her to keep in contact with genetics to keep up with whether additional testing may be needed. 

## 2017-07-08 NOTE — Progress Notes (Signed)

## 2017-07-10 ENCOUNTER — Ambulatory Visit: Payer: Self-pay | Admitting: Genetic Counselor

## 2017-07-10 ENCOUNTER — Ambulatory Visit
Admission: RE | Admit: 2017-07-10 | Discharge: 2017-07-10 | Disposition: A | Discharge status: Home / Self Care | Payer: Medicare Other | Source: Ambulatory Visit | Attending: Radiation Oncology | Admitting: Radiation Oncology

## 2017-07-10 DIAGNOSIS — Z9841 Cataract extraction status, right eye: Secondary | ICD-10-CM | POA: Diagnosis not present

## 2017-07-10 DIAGNOSIS — K219 Gastro-esophageal reflux disease without esophagitis: Secondary | ICD-10-CM | POA: Diagnosis not present

## 2017-07-10 DIAGNOSIS — Z1379 Encounter for other screening for genetic and chromosomal anomalies: Secondary | ICD-10-CM

## 2017-07-10 DIAGNOSIS — E7800 Pure hypercholesterolemia, unspecified: Secondary | ICD-10-CM | POA: Diagnosis not present

## 2017-07-10 DIAGNOSIS — Z170 Estrogen receptor positive status [ER+]: Secondary | ICD-10-CM

## 2017-07-10 DIAGNOSIS — C50411 Malignant neoplasm of upper-outer quadrant of right female breast: Secondary | ICD-10-CM | POA: Diagnosis not present

## 2017-07-10 DIAGNOSIS — Z961 Presence of intraocular lens: Secondary | ICD-10-CM | POA: Diagnosis not present

## 2017-07-10 DIAGNOSIS — Z79899 Other long term (current) drug therapy: Secondary | ICD-10-CM | POA: Diagnosis not present

## 2017-07-10 DIAGNOSIS — Z800 Family history of malignant neoplasm of digestive organs: Secondary | ICD-10-CM

## 2017-07-10 DIAGNOSIS — Z510 Encounter for antineoplastic radiation therapy: Secondary | ICD-10-CM | POA: Diagnosis not present

## 2017-07-10 DIAGNOSIS — Z9842 Cataract extraction status, left eye: Secondary | ICD-10-CM | POA: Diagnosis not present

## 2017-07-10 DIAGNOSIS — F419 Anxiety disorder, unspecified: Secondary | ICD-10-CM | POA: Diagnosis not present

## 2017-07-10 DIAGNOSIS — Z8041 Family history of malignant neoplasm of ovary: Secondary | ICD-10-CM

## 2017-07-10 DIAGNOSIS — Z8249 Family history of ischemic heart disease and other diseases of the circulatory system: Secondary | ICD-10-CM | POA: Diagnosis not present

## 2017-07-10 DIAGNOSIS — I10 Essential (primary) hypertension: Secondary | ICD-10-CM | POA: Diagnosis not present

## 2017-07-10 DIAGNOSIS — Z7982 Long term (current) use of aspirin: Secondary | ICD-10-CM | POA: Diagnosis not present

## 2017-07-10 NOTE — Progress Notes (Signed)
HPI: Marissa [name] was previously seen in the [place] [facility] due to a personal and family history of cancer and concerns regarding a hereditary predisposition to cancer. Please refer to our prior cancer genetics clinic note for more information regarding Marissa [name]'s medical, social and family histories, and our assessment and recommendations, at the time. Marissa [name] recent genetic test results were disclosed to her, as were recommendations warranted by these results. These results and recommendations are discussed in more detail below.  CANCER HISTORY:    Carcinoma of upper-outer quadrant of right breast in female, estrogen receptor positive ([place])   06/11/2017 Initial Diagnosis    Carcinoma of upper-outer quadrant of right breast in female, estrogen receptor positive ([place])      07/08/2017 Genetic Testing    Negative genetic testing on the Multi-cancer panel.  The Multi-Gene Panel offered by [facility] includes sequencing and/or deletion duplication testing of the following 83 genes: ALK, APC, ATM, AXIN2,BAP1,  BARD1, BLM, BMPR1A, BRCA1, BRCA2, BRIP1, CASR, CDC73, CDH1, CDK4, CDKN1B, CDKN1C, CDKN2A (p14ARF), CDKN2A (p16INK4a), CEBPA, CHEK2, CTNNA1, DICER1, DIS3L2, EGFR (c.2369C>T, p.Thr790Met variant only), EPCAM (Deletion/duplication testing only), FH, FLCN, GATA2, GPC3, GREM1 (Promoter region deletion/duplication testing only), HOXB13 (c.251G>A, p.Gly84Glu), HRAS, KIT, MAX, MEN1, MET, MITF (c.952G>A, p.Glu318Lys variant only), MLH1, MSH2, MSH3, MSH6, MUTYH, NBN, NF1, NF2, NTHL1, PALB2, PDGFRA, PHOX2B, PMS2, POLD1, POLE, POT1, PRKAR1A, PTCH1, PTEN, RAD50, RAD51C, RAD51D, RB1, RECQL4, RET, RUNX1, SDHAF2, SDHA (sequence changes only), SDHB, SDHC, SDHD, SMAD4, SMARCA4, SMARCB1, SMARCE1, STK11, SUFU, TERT, TERT, TMEM127, TP53, TSC1, TSC2, VHL, WRN and WT1.  The report date is July 08, 2017.        FAMILY HISTORY:  We obtained a detailed, 4-generation family history.  Significant  diagnoses are listed below: Family History  Problem Relation Age of Onset  . CAD Mother 15  . Leukemia Father   . Parkinson's disease Father   . Breast cancer Daughter 49       BRCA neg  . Heart Problems Maternal Aunt   . Cancer Maternal Uncle        NOS  . Cancer Paternal Uncle        NOS  . Diabetes Maternal Grandfather   . Liver cancer Maternal Grandfather   . Cancer Paternal Grandmother        NOS  . Brain cancer Maternal Aunt   . Lung cancer Maternal Aunt        smoker  . Cancer Maternal Uncle        NOS  . Stomach cancer Maternal Uncle   . Lung cancer Maternal Uncle        smoker  . Breast cancer Daughter        BRCA neg  . Breast cancer Cousin        mat first cousin  . Breast cancer Cousin        mat first cousin dx under 39  . Breast cancer Cousin        2 additional mat first cousins  . Ovarian cancer Cousin        mat first cousin  . Cancer Cousin        several female mat first cousins with cancer NOS  . Breast cancer Cousin        2 pat first cousins  . Cancer Cousin        3 pat first cousins with cancer NOS    The patient has two daughters.  Her oldest daughter was diagnosed with  breast cancer at age 67.  She underwent genetic testing in 2013 and is negative for BRCA mutations.  The other daughter is cancer free.  Reportedly she also underwent genetic testing for BRCA mutations and is reportedly negative.  The patient's parents are deceased.  The patient's mother died in her mid 79's from heart issues.  She had four brothers and three sisters.  Two brothers had unknown cancers, one who had a son who died of an unknown cancer and the other had a son who died of lung cancer.  Another brother had stomach cancer, and had three daughters who had breast cancer and one daughter who had ovarian cancer.  The last brother had lung cancer.  One sister died of heart problems, and had a son who died of an unknown cancer.  A second sister had a brain cancer, and has a  granddaughter who had breast cancer in her mid to late 46's.  The third sister is alive but had lung cancer.  She has a daughter who had breast cancer under 55.  The maternal grandparents are deceased.  The grandfather died of liver cancer and the grandmother died of old age at 72.  The patient's father had leukemia and died in his mid 41's.  He had two brothers and two sisters.  One sister is alive and no cancer.  The second sister did not have cancer but had a daughter who had breast cancer and died in her 31's.  One brother died of an unknown cancer.  He had 18 children, three of whom died recently of an unknown cancer.  The second brother had a daughter with breast cancer.  The paternal grandparents are deceased.  The grandfather died in a car accident and the grandmother died of an unknown cancer.  Patient's maternal ancestors are of caucasian descent, and paternal ancestors are of Caucasian descent. There is no reported Ashkenazi Jewish ancestry. There is no known consanguinity.  GENETIC TEST RESULTS: Genetic testing reported out on July 08, 2017 through the multi-cancer panel found no deleterious mutations.  The Multi-Gene Panel offered by [facility] includes sequencing and/or deletion duplication testing of the following 83 genes: ALK, APC, ATM, AXIN2,BAP1,  BARD1, BLM, BMPR1A, BRCA1, BRCA2, BRIP1, CASR, CDC73, CDH1, CDK4, CDKN1B, CDKN1C, CDKN2A (p14ARF), CDKN2A (p16INK4a), CEBPA, CHEK2, CTNNA1, DICER1, DIS3L2, EGFR (c.2369C>T, p.Thr790Met variant only), EPCAM (Deletion/duplication testing only), FH, FLCN, GATA2, GPC3, GREM1 (Promoter region deletion/duplication testing only), HOXB13 (c.251G>A, p.Gly84Glu), HRAS, KIT, MAX, MEN1, MET, MITF (c.952G>A, p.Glu318Lys variant only), MLH1, MSH2, MSH3, MSH6, MUTYH, NBN, NF1, NF2, NTHL1, PALB2, PDGFRA, PHOX2B, PMS2, POLD1, POLE, POT1, PRKAR1A, PTCH1, PTEN, RAD50, RAD51C, RAD51D, RB1, RECQL4, RET, RUNX1, SDHAF2, SDHA (sequence changes only), SDHB, SDHC, SDHD,  SMAD4, SMARCA4, SMARCB1, SMARCE1, STK11, SUFU, TERT, TERT, TMEM127, TP53, TSC1, TSC2, VHL, WRN and WT1.    The test report has been scanned into EP[employer] and is located under the Molecular Pathology section of the Results Review tab.   We discussed with Marissa [name] that since the current genetic testing is not perfect, it is possible there may be a gene mutation in one of these genes that current testing cannot detect, but that chance is small. We also discussed, that it is possible that another gene that has not yet been discovered, or that we have not yet tested, is responsible for the cancer diagnoses in the family, and it is, therefore, important to remain in touch with cancer genetics in the future so that we can continue to offer Ms.  [name] the most up to date genetic testing.     CANCER SCREENING RECOMMENDATIONS: This result is reassuring and indicates that Marissa [name] likely does not have an increased risk for a future cancer due to a mutation in one of these genes. This normal test also suggests that Marissa [name]'s cancer was most likely not due to an inherited predisposition associated with one of these genes.  Most cancers happen by chance and this negative test suggests that her cancer falls into this category.  We, therefore, recommended she continue to follow the cancer management and screening guidelines provided by her oncology and primary healthcare provider.   RECOMMENDATIONS FOR FAMILY MEMBERS: Women in this family might be at some increased risk of developing cancer, over the general population risk, simply due to the family history of cancer. We recommended women in this family have a yearly mammogram beginning at age 39, or 70 years younger than the earliest onset of cancer, an annual clinical breast exam, and perform monthly breast self-exams. Women in this family should also have a gynecological exam as recommended by their primary provider. All family members should have a colonoscopy by age  38.  FOLLOW-UP: Lastly, we discussed with Marissa [name] that cancer genetics is a rapidly advancing field and it is possible that new genetic tests will be appropriate for her and/or her family members in the future. We encouraged her to remain in contact with cancer genetics on an annual basis so we can update her personal and family histories and let her know of advances in cancer genetics that may benefit this family.   Our contact number was provided. Marissa [name] questions were answered to her satisfaction, and she knows she is welcome to call us at anytime with additional questions or concerns.   Roma Kayser, MS, [facility] Certified Genetic Counselor [email or web address]

## 2017-07-11 ENCOUNTER — Ambulatory Visit
Admission: RE | Admit: 2017-07-11 | Discharge: 2017-07-11 | Disposition: A | Discharge status: Home / Self Care | Payer: Medicare Other | Source: Ambulatory Visit | Attending: Radiation Oncology | Admitting: Radiation Oncology

## 2017-07-11 DIAGNOSIS — Z510 Encounter for antineoplastic radiation therapy: Secondary | ICD-10-CM | POA: Diagnosis not present

## 2017-07-11 DIAGNOSIS — F419 Anxiety disorder, unspecified: Secondary | ICD-10-CM | POA: Diagnosis not present

## 2017-07-11 DIAGNOSIS — Z170 Estrogen receptor positive status [ER+]: Secondary | ICD-10-CM | POA: Diagnosis not present

## 2017-07-11 DIAGNOSIS — C50411 Malignant neoplasm of upper-outer quadrant of right female breast: Secondary | ICD-10-CM | POA: Diagnosis not present

## 2017-07-11 DIAGNOSIS — Z79899 Other long term (current) drug therapy: Secondary | ICD-10-CM | POA: Diagnosis not present

## 2017-07-11 DIAGNOSIS — K219 Gastro-esophageal reflux disease without esophagitis: Secondary | ICD-10-CM | POA: Diagnosis not present

## 2017-07-12 ENCOUNTER — Ambulatory Visit
Admission: RE | Admit: 2017-07-12 | Discharge: 2017-07-12 | Disposition: A | Discharge status: Home / Self Care | Payer: Medicare Other | Source: Ambulatory Visit | Attending: Radiation Oncology | Admitting: Radiation Oncology

## 2017-07-12 DIAGNOSIS — Z789 Other specified health status: Secondary | ICD-10-CM | POA: Diagnosis not present

## 2017-07-12 DIAGNOSIS — H01005 Unspecified blepharitis left lower eyelid: Secondary | ICD-10-CM | POA: Diagnosis not present

## 2017-07-12 DIAGNOSIS — C50411 Malignant neoplasm of upper-outer quadrant of right female breast: Secondary | ICD-10-CM | POA: Diagnosis not present

## 2017-07-12 DIAGNOSIS — F419 Anxiety disorder, unspecified: Secondary | ICD-10-CM | POA: Diagnosis not present

## 2017-07-12 DIAGNOSIS — B029 Zoster without complications: Secondary | ICD-10-CM | POA: Diagnosis not present

## 2017-07-12 DIAGNOSIS — Z510 Encounter for antineoplastic radiation therapy: Secondary | ICD-10-CM | POA: Diagnosis not present

## 2017-07-12 DIAGNOSIS — Z79899 Other long term (current) drug therapy: Secondary | ICD-10-CM | POA: Diagnosis not present

## 2017-07-12 DIAGNOSIS — Z6837 Body mass index (BMI) 37.0-37.9, adult: Secondary | ICD-10-CM | POA: Diagnosis not present

## 2017-07-12 DIAGNOSIS — N183 Chronic kidney disease, stage 3 (moderate): Secondary | ICD-10-CM | POA: Diagnosis not present

## 2017-07-12 DIAGNOSIS — Z170 Estrogen receptor positive status [ER+]: Secondary | ICD-10-CM | POA: Diagnosis not present

## 2017-07-12 DIAGNOSIS — H01004 Unspecified blepharitis left upper eyelid: Secondary | ICD-10-CM | POA: Diagnosis not present

## 2017-07-12 DIAGNOSIS — I10 Essential (primary) hypertension: Secondary | ICD-10-CM | POA: Diagnosis not present

## 2017-07-12 DIAGNOSIS — Z299 Encounter for prophylactic measures, unspecified: Secondary | ICD-10-CM | POA: Diagnosis not present

## 2017-07-12 DIAGNOSIS — H1032 Unspecified acute conjunctivitis, left eye: Secondary | ICD-10-CM | POA: Diagnosis not present

## 2017-07-12 DIAGNOSIS — K219 Gastro-esophageal reflux disease without esophagitis: Secondary | ICD-10-CM | POA: Diagnosis not present

## 2017-07-12 DIAGNOSIS — E7800 Pure hypercholesterolemia, unspecified: Secondary | ICD-10-CM | POA: Diagnosis not present

## 2017-07-12 DIAGNOSIS — B0239 Other herpes zoster eye disease: Secondary | ICD-10-CM | POA: Diagnosis not present

## 2017-07-15 ENCOUNTER — Ambulatory Visit: Payer: Medicare Other | Admitting: Radiation Oncology

## 2017-07-15 ENCOUNTER — Ambulatory Visit
Admission: RE | Admit: 2017-07-15 | Discharge: 2017-07-15 | Disposition: A | Discharge status: Home / Self Care | Payer: Medicare Other | Source: Ambulatory Visit | Attending: Radiation Oncology | Admitting: Radiation Oncology

## 2017-07-15 DIAGNOSIS — Z170 Estrogen receptor positive status [ER+]: Secondary | ICD-10-CM | POA: Diagnosis not present

## 2017-07-15 DIAGNOSIS — F419 Anxiety disorder, unspecified: Secondary | ICD-10-CM | POA: Diagnosis not present

## 2017-07-15 DIAGNOSIS — K219 Gastro-esophageal reflux disease without esophagitis: Secondary | ICD-10-CM | POA: Diagnosis not present

## 2017-07-15 DIAGNOSIS — H26493 Other secondary cataract, bilateral: Secondary | ICD-10-CM | POA: Diagnosis not present

## 2017-07-15 DIAGNOSIS — B0230 Zoster ocular disease, unspecified: Secondary | ICD-10-CM | POA: Diagnosis not present

## 2017-07-15 DIAGNOSIS — Z961 Presence of intraocular lens: Secondary | ICD-10-CM | POA: Diagnosis not present

## 2017-07-15 DIAGNOSIS — Z79899 Other long term (current) drug therapy: Secondary | ICD-10-CM | POA: Diagnosis not present

## 2017-07-15 DIAGNOSIS — B0231 Zoster conjunctivitis: Secondary | ICD-10-CM | POA: Diagnosis not present

## 2017-07-15 DIAGNOSIS — Z510 Encounter for antineoplastic radiation therapy: Secondary | ICD-10-CM | POA: Diagnosis not present

## 2017-07-15 DIAGNOSIS — C50411 Malignant neoplasm of upper-outer quadrant of right female breast: Secondary | ICD-10-CM

## 2017-07-15 MED ORDER — RADIAPLEXRX EX GEL
Freq: Once | CUTANEOUS | Status: AC
  Administered 2017-07-15: 11:00:00 via TOPICAL

## 2017-07-16 ENCOUNTER — Ambulatory Visit
Admission: RE | Admit: 2017-07-16 | Discharge: 2017-07-16 | Disposition: A | Discharge status: Home / Self Care | Payer: Medicare Other | Source: Ambulatory Visit | Attending: Radiation Oncology | Admitting: Radiation Oncology

## 2017-07-16 DIAGNOSIS — C50411 Malignant neoplasm of upper-outer quadrant of right female breast: Secondary | ICD-10-CM | POA: Diagnosis not present

## 2017-07-16 DIAGNOSIS — Z510 Encounter for antineoplastic radiation therapy: Secondary | ICD-10-CM | POA: Diagnosis not present

## 2017-07-16 DIAGNOSIS — Z79899 Other long term (current) drug therapy: Secondary | ICD-10-CM | POA: Diagnosis not present

## 2017-07-16 DIAGNOSIS — F419 Anxiety disorder, unspecified: Secondary | ICD-10-CM | POA: Diagnosis not present

## 2017-07-16 DIAGNOSIS — K219 Gastro-esophageal reflux disease without esophagitis: Secondary | ICD-10-CM | POA: Diagnosis not present

## 2017-07-16 DIAGNOSIS — Z170 Estrogen receptor positive status [ER+]: Secondary | ICD-10-CM | POA: Diagnosis not present

## 2017-07-17 ENCOUNTER — Ambulatory Visit
Admission: RE | Admit: 2017-07-17 | Discharge: 2017-07-17 | Disposition: A | Discharge status: Home / Self Care | Payer: Medicare Other | Source: Ambulatory Visit | Attending: Radiation Oncology | Admitting: Radiation Oncology

## 2017-07-17 DIAGNOSIS — Z510 Encounter for antineoplastic radiation therapy: Secondary | ICD-10-CM | POA: Diagnosis not present

## 2017-07-17 DIAGNOSIS — C50411 Malignant neoplasm of upper-outer quadrant of right female breast: Secondary | ICD-10-CM | POA: Diagnosis not present

## 2017-07-17 DIAGNOSIS — Z170 Estrogen receptor positive status [ER+]: Secondary | ICD-10-CM | POA: Diagnosis not present

## 2017-07-17 DIAGNOSIS — F419 Anxiety disorder, unspecified: Secondary | ICD-10-CM | POA: Diagnosis not present

## 2017-07-17 DIAGNOSIS — K219 Gastro-esophageal reflux disease without esophagitis: Secondary | ICD-10-CM | POA: Diagnosis not present

## 2017-07-17 DIAGNOSIS — Z79899 Other long term (current) drug therapy: Secondary | ICD-10-CM | POA: Diagnosis not present

## 2017-07-18 ENCOUNTER — Ambulatory Visit
Admission: RE | Admit: 2017-07-18 | Discharge: 2017-07-18 | Disposition: A | Discharge status: Home / Self Care | Payer: Medicare Other | Source: Ambulatory Visit | Attending: Radiation Oncology | Admitting: Radiation Oncology

## 2017-07-18 DIAGNOSIS — Z170 Estrogen receptor positive status [ER+]: Secondary | ICD-10-CM | POA: Diagnosis not present

## 2017-07-18 DIAGNOSIS — C50411 Malignant neoplasm of upper-outer quadrant of right female breast: Secondary | ICD-10-CM | POA: Diagnosis not present

## 2017-07-18 DIAGNOSIS — K219 Gastro-esophageal reflux disease without esophagitis: Secondary | ICD-10-CM | POA: Diagnosis not present

## 2017-07-18 DIAGNOSIS — F419 Anxiety disorder, unspecified: Secondary | ICD-10-CM | POA: Diagnosis not present

## 2017-07-18 DIAGNOSIS — Z510 Encounter for antineoplastic radiation therapy: Secondary | ICD-10-CM | POA: Diagnosis not present

## 2017-07-18 DIAGNOSIS — Z79899 Other long term (current) drug therapy: Secondary | ICD-10-CM | POA: Diagnosis not present

## 2017-07-19 ENCOUNTER — Ambulatory Visit
Admission: RE | Admit: 2017-07-19 | Discharge: 2017-07-19 | Disposition: A | Discharge status: Home / Self Care | Payer: Medicare Other | Source: Ambulatory Visit | Attending: Radiation Oncology | Admitting: Radiation Oncology

## 2017-07-19 DIAGNOSIS — F419 Anxiety disorder, unspecified: Secondary | ICD-10-CM | POA: Diagnosis not present

## 2017-07-19 DIAGNOSIS — Z79899 Other long term (current) drug therapy: Secondary | ICD-10-CM | POA: Diagnosis not present

## 2017-07-19 DIAGNOSIS — Z170 Estrogen receptor positive status [ER+]: Secondary | ICD-10-CM | POA: Diagnosis not present

## 2017-07-19 DIAGNOSIS — Z510 Encounter for antineoplastic radiation therapy: Secondary | ICD-10-CM | POA: Diagnosis not present

## 2017-07-19 DIAGNOSIS — K219 Gastro-esophageal reflux disease without esophagitis: Secondary | ICD-10-CM | POA: Diagnosis not present

## 2017-07-19 DIAGNOSIS — C50411 Malignant neoplasm of upper-outer quadrant of right female breast: Secondary | ICD-10-CM | POA: Diagnosis not present

## 2017-07-22 ENCOUNTER — Ambulatory Visit: Payer: Medicare Other | Admitting: Radiation Oncology

## 2017-07-22 ENCOUNTER — Ambulatory Visit
Admission: RE | Admit: 2017-07-22 | Discharge: 2017-07-22 | Disposition: A | Discharge status: Home / Self Care | Payer: Medicare Other | Source: Ambulatory Visit | Attending: Radiation Oncology | Admitting: Radiation Oncology

## 2017-07-22 DIAGNOSIS — Z79899 Other long term (current) drug therapy: Secondary | ICD-10-CM | POA: Diagnosis not present

## 2017-07-22 DIAGNOSIS — Z170 Estrogen receptor positive status [ER+]: Secondary | ICD-10-CM | POA: Diagnosis not present

## 2017-07-22 DIAGNOSIS — C50411 Malignant neoplasm of upper-outer quadrant of right female breast: Secondary | ICD-10-CM | POA: Diagnosis not present

## 2017-07-22 DIAGNOSIS — F419 Anxiety disorder, unspecified: Secondary | ICD-10-CM | POA: Diagnosis not present

## 2017-07-22 DIAGNOSIS — K219 Gastro-esophageal reflux disease without esophagitis: Secondary | ICD-10-CM | POA: Diagnosis not present

## 2017-07-22 DIAGNOSIS — Z510 Encounter for antineoplastic radiation therapy: Secondary | ICD-10-CM | POA: Diagnosis not present

## 2017-07-22 MED ORDER — RADIAPLEXRX EX GEL
Freq: Once | CUTANEOUS | Status: AC
  Administered 2017-07-22: 11:00:00 via TOPICAL

## 2017-07-23 ENCOUNTER — Ambulatory Visit
Admission: RE | Admit: 2017-07-23 | Discharge: 2017-07-23 | Disposition: A | Discharge status: Home / Self Care | Payer: Medicare Other | Source: Ambulatory Visit | Attending: Radiation Oncology | Admitting: Radiation Oncology

## 2017-07-23 DIAGNOSIS — K219 Gastro-esophageal reflux disease without esophagitis: Secondary | ICD-10-CM | POA: Diagnosis not present

## 2017-07-23 DIAGNOSIS — Z510 Encounter for antineoplastic radiation therapy: Secondary | ICD-10-CM | POA: Diagnosis not present

## 2017-07-23 DIAGNOSIS — Z79899 Other long term (current) drug therapy: Secondary | ICD-10-CM | POA: Diagnosis not present

## 2017-07-23 DIAGNOSIS — C50411 Malignant neoplasm of upper-outer quadrant of right female breast: Secondary | ICD-10-CM | POA: Diagnosis not present

## 2017-07-23 DIAGNOSIS — F419 Anxiety disorder, unspecified: Secondary | ICD-10-CM | POA: Diagnosis not present

## 2017-07-23 DIAGNOSIS — Z170 Estrogen receptor positive status [ER+]: Secondary | ICD-10-CM | POA: Diagnosis not present

## 2017-07-24 ENCOUNTER — Ambulatory Visit
Admission: RE | Admit: 2017-07-24 | Discharge: 2017-07-24 | Disposition: A | Discharge status: Home / Self Care | Payer: Medicare Other | Source: Ambulatory Visit | Attending: Radiation Oncology | Admitting: Radiation Oncology

## 2017-07-24 DIAGNOSIS — Z510 Encounter for antineoplastic radiation therapy: Secondary | ICD-10-CM | POA: Diagnosis not present

## 2017-07-24 DIAGNOSIS — Z79899 Other long term (current) drug therapy: Secondary | ICD-10-CM | POA: Diagnosis not present

## 2017-07-24 DIAGNOSIS — C50411 Malignant neoplasm of upper-outer quadrant of right female breast: Secondary | ICD-10-CM | POA: Diagnosis not present

## 2017-07-24 DIAGNOSIS — F419 Anxiety disorder, unspecified: Secondary | ICD-10-CM | POA: Diagnosis not present

## 2017-07-24 DIAGNOSIS — Z170 Estrogen receptor positive status [ER+]: Secondary | ICD-10-CM | POA: Diagnosis not present

## 2017-07-24 DIAGNOSIS — K219 Gastro-esophageal reflux disease without esophagitis: Secondary | ICD-10-CM | POA: Diagnosis not present

## 2017-07-25 ENCOUNTER — Ambulatory Visit
Admission: RE | Admit: 2017-07-25 | Discharge: 2017-07-25 | Disposition: A | Discharge status: Home / Self Care | Payer: Medicare Other | Source: Ambulatory Visit | Attending: Radiation Oncology | Admitting: Radiation Oncology

## 2017-07-25 ENCOUNTER — Ambulatory Visit (HOSPITAL_COMMUNITY): Payer: Medicare Other | Admitting: Adult Health

## 2017-07-25 ENCOUNTER — Other Ambulatory Visit (HOSPITAL_COMMUNITY): Payer: Medicare Other

## 2017-07-25 DIAGNOSIS — Z79899 Other long term (current) drug therapy: Secondary | ICD-10-CM | POA: Diagnosis not present

## 2017-07-25 DIAGNOSIS — K219 Gastro-esophageal reflux disease without esophagitis: Secondary | ICD-10-CM | POA: Diagnosis not present

## 2017-07-25 DIAGNOSIS — Z510 Encounter for antineoplastic radiation therapy: Secondary | ICD-10-CM | POA: Diagnosis not present

## 2017-07-25 DIAGNOSIS — Z170 Estrogen receptor positive status [ER+]: Secondary | ICD-10-CM | POA: Diagnosis not present

## 2017-07-25 DIAGNOSIS — C50411 Malignant neoplasm of upper-outer quadrant of right female breast: Secondary | ICD-10-CM | POA: Diagnosis not present

## 2017-07-25 DIAGNOSIS — F419 Anxiety disorder, unspecified: Secondary | ICD-10-CM | POA: Diagnosis not present

## 2017-07-26 ENCOUNTER — Ambulatory Visit
Admission: RE | Admit: 2017-07-26 | Discharge: 2017-07-26 | Disposition: A | Discharge status: Home / Self Care | Payer: Medicare Other | Source: Ambulatory Visit | Attending: Radiation Oncology | Admitting: Radiation Oncology

## 2017-07-26 DIAGNOSIS — Z510 Encounter for antineoplastic radiation therapy: Secondary | ICD-10-CM | POA: Diagnosis not present

## 2017-07-26 DIAGNOSIS — F419 Anxiety disorder, unspecified: Secondary | ICD-10-CM | POA: Diagnosis not present

## 2017-07-26 DIAGNOSIS — B0239 Other herpes zoster eye disease: Secondary | ICD-10-CM | POA: Diagnosis not present

## 2017-07-26 DIAGNOSIS — Z170 Estrogen receptor positive status [ER+]: Secondary | ICD-10-CM | POA: Diagnosis not present

## 2017-07-26 DIAGNOSIS — H26493 Other secondary cataract, bilateral: Secondary | ICD-10-CM | POA: Diagnosis not present

## 2017-07-26 DIAGNOSIS — Z961 Presence of intraocular lens: Secondary | ICD-10-CM | POA: Diagnosis not present

## 2017-07-26 DIAGNOSIS — C50411 Malignant neoplasm of upper-outer quadrant of right female breast: Secondary | ICD-10-CM | POA: Diagnosis not present

## 2017-07-26 DIAGNOSIS — Z79899 Other long term (current) drug therapy: Secondary | ICD-10-CM | POA: Diagnosis not present

## 2017-07-26 DIAGNOSIS — K219 Gastro-esophageal reflux disease without esophagitis: Secondary | ICD-10-CM | POA: Diagnosis not present

## 2017-07-26 DIAGNOSIS — B0231 Zoster conjunctivitis: Secondary | ICD-10-CM | POA: Diagnosis not present

## 2017-07-29 ENCOUNTER — Ambulatory Visit
Admission: RE | Admit: 2017-07-29 | Discharge: 2017-07-29 | Disposition: A | Discharge status: Home / Self Care | Payer: Medicare Other | Source: Ambulatory Visit | Attending: Radiation Oncology | Admitting: Radiation Oncology

## 2017-07-29 DIAGNOSIS — Z79899 Other long term (current) drug therapy: Secondary | ICD-10-CM | POA: Diagnosis not present

## 2017-07-29 DIAGNOSIS — Z170 Estrogen receptor positive status [ER+]: Principal | ICD-10-CM

## 2017-07-29 DIAGNOSIS — K219 Gastro-esophageal reflux disease without esophagitis: Secondary | ICD-10-CM | POA: Diagnosis not present

## 2017-07-29 DIAGNOSIS — C50411 Malignant neoplasm of upper-outer quadrant of right female breast: Secondary | ICD-10-CM

## 2017-07-29 DIAGNOSIS — F419 Anxiety disorder, unspecified: Secondary | ICD-10-CM | POA: Diagnosis not present

## 2017-07-29 DIAGNOSIS — Z510 Encounter for antineoplastic radiation therapy: Secondary | ICD-10-CM | POA: Diagnosis not present

## 2017-07-29 DIAGNOSIS — B0232 Zoster iridocyclitis: Secondary | ICD-10-CM | POA: Diagnosis not present

## 2017-07-29 MED ORDER — RADIAPLEXRX EX GEL
Freq: Once | CUTANEOUS | Status: AC
  Administered 2017-07-29: 09:00:00 via TOPICAL

## 2017-07-30 ENCOUNTER — Other Ambulatory Visit (HOSPITAL_COMMUNITY): Payer: Medicare Other

## 2017-07-30 ENCOUNTER — Ambulatory Visit
Admission: RE | Admit: 2017-07-30 | Discharge: 2017-07-30 | Disposition: A | Discharge status: Home / Self Care | Payer: Medicare Other | Source: Ambulatory Visit | Attending: Radiation Oncology | Admitting: Radiation Oncology

## 2017-07-30 ENCOUNTER — Ambulatory Visit (HOSPITAL_COMMUNITY): Payer: Medicare Other | Admitting: Adult Health

## 2017-07-30 DIAGNOSIS — F419 Anxiety disorder, unspecified: Secondary | ICD-10-CM | POA: Diagnosis not present

## 2017-07-30 DIAGNOSIS — K219 Gastro-esophageal reflux disease without esophagitis: Secondary | ICD-10-CM | POA: Diagnosis not present

## 2017-07-30 DIAGNOSIS — Z510 Encounter for antineoplastic radiation therapy: Secondary | ICD-10-CM | POA: Diagnosis not present

## 2017-07-30 DIAGNOSIS — Z170 Estrogen receptor positive status [ER+]: Secondary | ICD-10-CM | POA: Diagnosis not present

## 2017-07-30 DIAGNOSIS — C50411 Malignant neoplasm of upper-outer quadrant of right female breast: Secondary | ICD-10-CM | POA: Diagnosis not present

## 2017-07-30 DIAGNOSIS — Z79899 Other long term (current) drug therapy: Secondary | ICD-10-CM | POA: Diagnosis not present

## 2017-07-31 ENCOUNTER — Ambulatory Visit
Admission: RE | Admit: 2017-07-31 | Discharge: 2017-07-31 | Disposition: A | Discharge status: Home / Self Care | Payer: Medicare Other | Source: Ambulatory Visit | Attending: Radiation Oncology | Admitting: Radiation Oncology

## 2017-07-31 ENCOUNTER — Encounter: Payer: Self-pay | Admitting: Radiation Oncology

## 2017-07-31 DIAGNOSIS — Z510 Encounter for antineoplastic radiation therapy: Secondary | ICD-10-CM | POA: Diagnosis not present

## 2017-07-31 DIAGNOSIS — Z170 Estrogen receptor positive status [ER+]: Secondary | ICD-10-CM | POA: Diagnosis not present

## 2017-07-31 DIAGNOSIS — K219 Gastro-esophageal reflux disease without esophagitis: Secondary | ICD-10-CM | POA: Diagnosis not present

## 2017-07-31 DIAGNOSIS — Z79899 Other long term (current) drug therapy: Secondary | ICD-10-CM | POA: Diagnosis not present

## 2017-07-31 DIAGNOSIS — C50411 Malignant neoplasm of upper-outer quadrant of right female breast: Secondary | ICD-10-CM | POA: Diagnosis not present

## 2017-07-31 DIAGNOSIS — F419 Anxiety disorder, unspecified: Secondary | ICD-10-CM | POA: Diagnosis not present

## 2017-08-01 ENCOUNTER — Inpatient Hospital Stay (HOSPITAL_COMMUNITY): Payer: Medicare Other | Attending: Adult Health | Admitting: Adult Health

## 2017-08-01 ENCOUNTER — Inpatient Hospital Stay (HOSPITAL_COMMUNITY): Payer: Medicare Other

## 2017-08-01 ENCOUNTER — Other Ambulatory Visit: Payer: Self-pay

## 2017-08-01 ENCOUNTER — Encounter (HOSPITAL_COMMUNITY): Payer: Self-pay | Admitting: Adult Health

## 2017-08-01 VITALS — BP 127/57 | HR 72 | Temp 98.0°F | Resp 18 | Ht 65.0 in | Wt 228.0 lb

## 2017-08-01 DIAGNOSIS — Z79899 Other long term (current) drug therapy: Secondary | ICD-10-CM | POA: Insufficient documentation

## 2017-08-01 DIAGNOSIS — I10 Essential (primary) hypertension: Secondary | ICD-10-CM | POA: Insufficient documentation

## 2017-08-01 DIAGNOSIS — Z9842 Cataract extraction status, left eye: Secondary | ICD-10-CM | POA: Diagnosis not present

## 2017-08-01 DIAGNOSIS — C50411 Malignant neoplasm of upper-outer quadrant of right female breast: Secondary | ICD-10-CM | POA: Insufficient documentation

## 2017-08-01 DIAGNOSIS — Z780 Asymptomatic menopausal state: Secondary | ICD-10-CM | POA: Diagnosis not present

## 2017-08-01 DIAGNOSIS — K5900 Constipation, unspecified: Secondary | ICD-10-CM | POA: Insufficient documentation

## 2017-08-01 DIAGNOSIS — Z170 Estrogen receptor positive status [ER+]: Principal | ICD-10-CM

## 2017-08-01 DIAGNOSIS — Z7982 Long term (current) use of aspirin: Secondary | ICD-10-CM | POA: Insufficient documentation

## 2017-08-01 DIAGNOSIS — F419 Anxiety disorder, unspecified: Secondary | ICD-10-CM | POA: Insufficient documentation

## 2017-08-01 DIAGNOSIS — Z923 Personal history of irradiation: Secondary | ICD-10-CM | POA: Diagnosis not present

## 2017-08-01 LAB — COMPREHENSIVE METABOLIC PANEL
ALT: 14 U/L (ref 14–54)
AST: 18 U/L (ref 15–41)
Albumin: 3.8 g/dL (ref 3.5–5.0)
Alkaline Phosphatase: 72 U/L (ref 38–126)
Anion gap: 10 (ref 5–15)
BUN: 22 mg/dL — AB (ref 6–20)
CHLORIDE: 99 mmol/L — AB (ref 101–111)
CO2: 26 mmol/L (ref 22–32)
Calcium: 10.4 mg/dL — ABNORMAL HIGH (ref 8.9–10.3)
Creatinine, Ser: 1.01 mg/dL — ABNORMAL HIGH (ref 0.44–1.00)
GFR calc Af Amer: >60 mL/min (ref >60)
GFR, EST NON AFRICAN AMERICAN: 55 mL/min — AB (ref >60)
Glucose, Bld: 94 mg/dL (ref 65–99)
POTASSIUM: 3.4 mmol/L — AB (ref 3.5–5.1)
Sodium: 135 mmol/L (ref 135–145)
Total Bilirubin: 0.8 mg/dL (ref 0.3–1.2)
Total Protein: 7 g/dL (ref 6.5–8.1)

## 2017-08-01 LAB — CBC WITH DIFFERENTIAL/PLATELET
BASOS ABS: 0 10*3/uL (ref 0.0–0.1)
BASOS PCT: 0 %
Eosinophils Absolute: 0.1 10*3/uL (ref 0.0–0.7)
Eosinophils Relative: 2 %
HCT: 42.3 % (ref 36.0–46.0)
Hemoglobin: 13.9 g/dL (ref 12.0–15.0)
LYMPHS PCT: 21 %
Lymphs Abs: 1.1 10*3/uL (ref 0.7–4.0)
MCH: 30.4 pg (ref 26.0–34.0)
MCHC: 32.9 g/dL (ref 30.0–36.0)
MCV: 92.6 fL (ref 78.0–100.0)
MONO ABS: 0.4 10*3/uL (ref 0.1–1.0)
Monocytes Relative: 8 %
Neutro Abs: 3.6 10*3/uL (ref 1.7–7.7)
Neutrophils Relative %: 69 %
PLATELETS: 202 10*3/uL (ref 150–400)
RBC: 4.57 MIL/uL (ref 3.87–5.11)
RDW: 12.7 % (ref 11.5–15.5)
WBC: 5.2 10*3/uL (ref 4.0–10.5)

## 2017-08-01 MED ORDER — EXEMESTANE 25 MG PO TABS: 25.0000 mg | ORAL_TABLET | Freq: Every day | ORAL | 0 refills | Status: DC

## 2017-08-01 MED ORDER — ANASTROZOLE 1 MG PO TABS: 1.0000 mg | ORAL_TABLET | Freq: Every day | ORAL | 0 refills | Status: DC

## 2017-08-01 NOTE — Progress Notes (Signed)
[address], [place] [address]   CLINIC:  Medical Oncology/Hematology  PCP:  Monico Blitz, MD [place] [address] [phone]   REASON FOR VISIT:  Follow-up for Stage IA invasive ductal carcinoma of (R) breast; ER+/PR+/HER2-  CURRENT THERAPY: s/p lumpectomy and adjuvant breast radiation therapy; Discussion for anti-estrogen therapy    BRIEF ONCOLOGIC HISTORY:    Carcinoma of upper-outer quadrant of right breast in female, estrogen receptor positive ([facility])   06/11/2017 Initial Diagnosis    Carcinoma of upper-outer quadrant of right breast in female, estrogen receptor positive ([facility] [facility])      07/08/2017 Genetic Testing    Negative genetic testing on the Multi-cancer panel.  The Multi-Gene Panel offered by [facility] includes sequencing and/or deletion duplication testing of the following 83 genes: ALK, APC, ATM, AXIN2,BAP1,  BARD1, BLM, BMPR1A, BRCA1, BRCA2, BRIP1, CASR, CDC73, CDH1, CDK4, CDKN1B, CDKN1C, CDKN2A (p14ARF), CDKN2A (p16INK4a), CEBPA, CHEK2, CTNNA1, DICER1, DIS3L2, EGFR (c.2369C>T, p.Thr790Met variant only), EPCAM (Deletion/duplication testing only), FH, FLCN, GATA2, GPC3, GREM1 (Promoter region deletion/duplication testing only), HOXB13 (c.251G>A, p.Gly84Glu), HRAS, KIT, MAX, MEN1, MET, MITF (c.952G>A, p.Glu318Lys variant only), MLH1, MSH2, MSH3, MSH6, MUTYH, NBN, NF1, NF2, NTHL1, PALB2, PDGFRA, PHOX2B, PMS2, POLD1, POLE, POT1, PRKAR1A, PTCH1, PTEN, RAD50, RAD51C, RAD51D, RB1, RECQL4, RET, RUNX1, SDHAF2, SDHA (sequence changes only), SDHB, SDHC, SDHD, SMAD4, SMARCA4, SMARCB1, SMARCE1, STK11, SUFU, TERT, TERT, TMEM127, TP53, TSC1, TSC2, VHL, WRN and WT1.  The report date is July 08, 2017.         HISTORY OF PRESENT ILLNESS:  (From Dr. Laverle Patter note on 06/13/17)  05/09/17: Right unilateral mammogram with TOMO: Spiculated mass within the upper right breast, 11 o'clock axis region, for which stereotactic-guided biopsy with 3D  tomosynthesis will be performed later today. 2. Suspicious calcifications within the upper-outer quadrant of the right breast for which stereotactic guided biopsy will be performed later today. 3. No additional architectural distortion is identified within the right breast.  05/09/17: 1. Breast, right, needle core biopsy, upper outer quadrant, posterior depth: FIBROADENOMA WITH CALCIFICATIONS. FIBROCYSTIC CHANGES WITH CALCIFICATIONS. THERE IS NO EVIDENCE OF MALIGNANCY. 2. Breast, right, needle core biopsy, upper outer quadrant, middle depth: INVASIVE DUCTAL CARCINOMA. ER/PR positive, HER negative, Ki67 5%.  05/20/17: Right breast lumpectomy with sentinel node biopsy performed by Dr. Donne Hazel. Surgical path: invasive ductal carcinoma, Grade 1, spanning 1.4 cm, 0/2 SLN positive, all margins were negative by at least 0.3 cm. pT1c pN0 cM0.   06/03/17: Oncotype Dx: recurrence score   06/11/17: Consultation with Dr. Isidore Moos regarding adjuvant breast RT. 4 weeks of RT planned.      INTERVAL HISTORY:  Ms. Meadow 72 y.o. female returns for routine follow-up for right breast cancer.   Here today unaccompanied.   Overall, she tells me she has been feeling well. Appetite 100%; energy levels 75%.  She completed her breast radiation therapy yesterday, 07/31/17. Her radiation course was complicated by shingles of the eye; ophthalmology has been following. She continues to have periodic (L) eye pain, but it is improving. She will complete her course or Valtrex tomorrow.    Since her last visit, she also underwent genetic testing which was negative. Her daughter has a h/o breast cancer and was found to recently have ATM mutation.   She feels well since completing radiation.  She tells me her breast is red, but no open wounds.  She feels like she is recovering well from her radiation. Denies any new breast complaints.  She has occasional (L) shoulder arthralgias; she tells me she thinks it is arthritis.   She also has occasional constipation, which she manages on her own.   Otherwise, she is largely without other complaints today.      REVIEW OF SYSTEMS:  Review of Systems  Constitutional: Positive for fatigue.  HENT:  Negative.   Eyes:       Improving (L) eye shingles. Denies any vision changes  Respiratory: Negative.   Cardiovascular: Negative.   Gastrointestinal: Positive for constipation (at times).  Endocrine: Negative.   Genitourinary: Negative.    Musculoskeletal: Positive for arthralgias.  Skin:       (R) breast red s/p XRT  Neurological: Negative.   Hematological: Negative.   Psychiatric/Behavioral: Negative.      PAST MEDICAL/SURGICAL HISTORY:  Past Medical History:  Diagnosis Date  . Anxiety   . Cancer ([facility]) 05/2017   right breast cancer  . Family history of breast cancer   . Family history of ovarian cancer   . Family history of stomach cancer   . GERD (gastroesophageal reflux disease)   . High cholesterol   . Hypertension    Past Surgical History:  Procedure Laterality Date  . CARDIAC CATHETERIZATION     2008- negative findings  . CATARACT EXTRACTION W/PHACO Right 09/06/2014   Procedure: CATARACT EXTRACTION PHACO AND INTRAOCULAR LENS PLACEMENT; CDE:  3.85;  Surgeon: Williams Che, MD;  Location: [facility];  Service: Ophthalmology;  Laterality: Right;  . CATARACT EXTRACTION W/PHACO Left 12/13/2014   Procedure: CATARACT EXTRACTION PHACO AND INTRAOCULAR LENS PLACEMENT LEFT EYE CDE=10.79;  Surgeon: Williams Che, MD;  Location: [facility];  Service: Ophthalmology;  Laterality: Left;  . ESOPHAGOGASTRODUODENOSCOPY N/A 05/05/2014   Procedure: ESOPHAGOGASTRODUODENOSCOPY (EGD);  Surgeon: Rogene Houston, MD;  Location: [facility];  Service: Endoscopy;  Laterality: N/A;  255  . EUS N/A 06/10/2014   Procedure: UPPER ENDOSCOPIC ULTRASOUND (EUS) LINEAR;  Surgeon: Milus Banister, MD;  Location: [facility];  Service: Endoscopy;  Laterality: N/A;  . RADIOACTIVE SEED  GUIDED PARTIAL MASTECTOMY WITH AXILLARY SENTINEL LYMPH NODE BIOPSY Right 05/20/2017   Procedure: RIGHT BREAST LUMPECTOMY WITH RADIOACTIVE SEED AND SENTINEL LYMPH NODE BIOPSY;  Surgeon: Rolm Bookbinder, MD;  Location: [facility];  Service: General;  Laterality: Right;  . TUBAL LIGATION       SOCIAL HISTORY:  Social History   Socioeconomic History  . Marital status: Married    Spouse name: Not on file  . Number of children: 2  . Years of education: Not on file  . Highest education level: Not on file  Social Needs  . Financial resource strain: Not on file  . Food insecurity - worry: Not on file  . Food insecurity - inability: Not on file  . Transportation needs - medical: Not on file  . Transportation needs - non-medical: Not on file  Occupational History  . Occupation: retired  Tobacco Use  . Smoking status: Never Smoker  . Smokeless tobacco: Never Used  Substance and Sexual Activity  . Alcohol use: No  . Drug use: No  . Sexual activity: Yes    Birth control/protection: Post-menopausal  Other Topics Concern  . Not on file  Social History Narrative  . Not on file    FAMILY HISTORY:  Family History  Problem Relation Age of Onset  . CAD Mother 29  . Leukemia Father   . Parkinson's disease Father   . Breast cancer Daughter 63  BRCA neg  . Heart Problems Maternal Aunt   . Cancer Maternal Uncle        NOS  . Cancer Paternal Uncle        NOS  . Diabetes Maternal Grandfather   . Liver cancer Maternal Grandfather   . Cancer Paternal Grandmother        NOS  . Brain cancer Maternal Aunt   . Lung cancer Maternal Aunt        smoker  . Cancer Maternal Uncle        NOS  . Stomach cancer Maternal Uncle   . Lung cancer Maternal Uncle        smoker  . Breast cancer Daughter        BRCA neg  . Breast cancer Cousin        mat first cousin  . Breast cancer Cousin        mat first cousin dx under 45  . Breast cancer Cousin        2 additional mat  first cousins  . Ovarian cancer Cousin        mat first cousin  . Cancer Cousin        several female mat first cousins with cancer NOS  . Breast cancer Cousin        2 pat first cousins  . Cancer Cousin        3 pat first cousins with cancer NOS    CURRENT MEDICATIONS:  Outpatient Encounter Medications as of 08/01/2017  Medication Sig Note  . amLODipine (NORVASC) 10 MG tablet Take 10 mg by mouth daily.   Marland Kitchen aspirin EC 81 MG tablet Take 81 mg by mouth daily.   . cholecalciferol (VITAMIN D) 1000 units tablet Take 2,000 Units by mouth daily.    . clonazePAM (KLONOPIN) 0.5 MG tablet Take 0.5 mg 2 (two) times daily as needed by mouth for anxiety.   Marland Kitchen co-enzyme Q-10 30 MG capsule Take 100 mg by mouth daily.   Marland Kitchen erythromycin ophthalmic ointment    . losartan-hydrochlorothiazide (HYZAAR) 100-25 MG per tablet Take 1 tablet by mouth every morning.  06/25/2016: Patient was advised to take an additional tablet due to Blood pressure levels  . metoprolol succinate (TOPROL-XL) 25 MG 24 hr tablet Take 25 mg by mouth every morning.    . Omega-3 Fatty Acids (OMEGA 3 500) 500 MG CAPS Take by mouth.   Marland Kitchen omeprazole (PRILOSEC) 20 MG capsule Take 20 mg by mouth daily.   . valACYclovir (VALTREX) 1000 MG tablet TAKE 1 GRAM BY MOUTH THREE TIMES A DAY   . anastrozole (ARIMIDEX) 1 MG tablet Take 1 tablet (1 mg total) by mouth daily.   . [DISCONTINUED] exemestane (AROMASIN) 25 MG tablet Take 1 tablet (25 mg total) by mouth daily.   . [DISCONTINUED] exemestane (AROMASIN) 25 MG tablet Take 1 tablet (25 mg total) by mouth daily.    No facility-administered encounter medications on file as of 08/01/2017.     ALLERGIES:  Allergies  Allergen Reactions  . Nitrostat [Nitroglycerin] Shortness Of Breath     PHYSICAL EXAM:  ECOG Performance status: 1 - Symptomatic; remains independent   Vitals:   08/01/17 0845  BP: (!) 127/57  Pulse: 72  Resp: 18  Temp: 98 F (36.7 C)  SpO2: 98%   Filed Weights   08/01/17  0845  Weight: 228 lb (103.4 kg)    Physical Exam  Constitutional: She is oriented to person, place, and time and well-developed,  well-nourished, and in no distress.  HENT:  Head: Normocephalic.  Mouth/Throat: Oropharynx is clear and moist. No oropharyngeal exudate.  Eyes: Pupils are equal, round, and reactive to light. No scleral icterus.  (L) eye with mild erythema. Mild surrounding erythema to (L) orbital region and forehead.   Neck: Normal range of motion. Neck supple.  Cardiovascular: Normal rate and regular rhythm.  Pulmonary/Chest: Effort normal and breath sounds normal. No respiratory distress. She has no wheezes.  Abdominal: Soft. Bowel sounds are normal. There is no tenderness.  Musculoskeletal: Normal range of motion. She exhibits no edema.  Lymphadenopathy:    She has no cervical adenopathy.       Right: No supraclavicular adenopathy present.       Left: No supraclavicular adenopathy present.  Neurological: She is alert and oriented to person, place, and time. No cranial nerve deficit. Gait normal.  Skin: Skin is warm and dry. There is erythema (R) breast erythema and mild edema appreciated as expected post-radiation. No moist desquamation seen.  Psychiatric: Mood, memory, affect and judgment normal.  Nursing note and vitals reviewed.    LABORATORY DATA:  I have reviewed the labs as listed.  CBC    Component Value Date/Time   WBC 5.2 08/01/2017 0757   RBC 4.57 08/01/2017 0757   HGB 13.9 08/01/2017 0757   HCT 42.3 08/01/2017 0757   PLT 202 08/01/2017 0757   MCV 92.6 08/01/2017 0757   MCH 30.4 08/01/2017 0757   MCHC 32.9 08/01/2017 0757   RDW 12.7 08/01/2017 0757   LYMPHSABS 1.1 08/01/2017 0757   MONOABS 0.4 08/01/2017 0757   EOSABS 0.1 08/01/2017 0757   BASOSABS 0.0 08/01/2017 0757   CMP Latest Ref Rng & Units 08/01/2017 05/17/2017 06/26/2016  Glucose 65 - 99 mg/dL 94 80 97  BUN 6 - 20 mg/dL 22(H) 22(H) 23(H)  Creatinine 0.44 - 1.00 mg/dL 1.01(H) 0.95 1.07(H)    Sodium 135 - 145 mmol/L 135 138 137  Potassium 3.5 - 5.1 mmol/L 3.4(L) 3.8 3.2(L)  Chloride 101 - 111 mmol/L 99(L) 102 103  CO2 22 - 32 mmol/L _0 Calcium 8.9 - 10.3 mg/dL 10.4(H) 9.8 10.0  Total Protein 6.5 - 8.1 g/dL 7.0 - -  Total Bilirubin 0.3 - 1.2 mg/dL 0.8 - -  Alkaline Phos 38 - 126 U/L 72 - -  AST 15 - 41 U/L 18 - -  ALT 14 - 54 U/L 14 - -    PENDING LABS:    DIAGNOSTIC IMAGING:    PATHOLOGY:  (R) breast lumpectomy surgical path: 05/20/17       Oncotype DX: 05/24/17          ASSESSMENT & PLAN:   Stage IA invasive ductal carcinoma of (R) breast; ER+/PR+/HER2-:  -Diagnosed in 05/2017. Treated with (R) breast lumpectomy with SLNB by Dr. Donne Hazel on 05/20/17 showing grade 1 invasive ductal carcinoma, spanning 1.4 cm, negative margins, 0/2 axillary SLN.  Oncotype DX recurrence score 10 (low-risk); no role for adjuvant chemotherapy.  Completed breast radiation by Dr. Isidore Moos from 07/03/17-07/31/17.  -Continuing to recover from her breast radiation therapy, which she completed yesterday 07/31/17.   -Next step in her treatment course includes anti-estrogen therapy.  We discussed the differences between Tamoxifen and aromatase inhibitors; we discussed the role that anti-estrogen plays in helping reduce risk of breast cancer recurrence or new breast cancers in the future.  Given that she is post-menopausal and has her uterus, would recommend aromatase inhibitor therapy for 5-10 years.  We reviewed the possible side effects of aromatase inhibitor therapy, including hot flashes, arthralgias, vaginal dryness, hair thinning, decreased bone density, elevated lipids, etc.  -Will start her on exemestane daily beginning 09/06/17; this will allow sufficient time for her breast to heal from radiation over the next month or so and also should be sufficient time for her herpes zoster infection to clear up.  Rx e-scribed to her mail order pharmacy ([facility]).  Plan will be to  continue anti-estrogen therapy for 5-10 years. We briefly discussed the possibility of ordering Breast Cancer Index (BCI) testing in the future to see if she may benefit from extension of therapy beyond 5 years. However, BCI testing will not change our plan for right now, which is for her to start AI and take for at least 5 years.  She agreed with this plan.  -Breast exam deferred today since she just completed radiation therapy.  Her first mammogram post-treatment will be due in 05/2018 (her annual mammogram); will place orders at subsequent follow-up visit. Discussed her follow-up schedule, which will likely include clinical breast exam and office visit here at the cancer center every 3-6 months.   -Return to cancer center 4-6 weeks after starting anti-estrogen therapy (mid-April 2019) for follow-up and to assess tolerance of AI.     Addendum:     *After leaving clinic today, patient called and stated that the exemestane would cost ~$400 for 90-day supply.  Anastrozole will only cost ~$17 for 90-day supply.  E-scribed new prescription for Anastrozole to mail order pharmacy ([facility]). She should still begin this therapy on 09/06/17.     Bone health:  -DEXA reportedly done in [place] with her PCP and was normal per patient. We do not have records available for review; I will ask nursing to contact Dr. Trena Platt office to send [place] a copy of her most recent DEXA scan report for our records.  -Recommended vitamin D supplementation, as well as increasing weight-bearing exercises as tolerated. Her calcium is mildly elevated today; will keep monitoring for now; if still elevated in the future, then can order PTH,etc to evaluate for parathyroid disorders.       Dispo:  -Start Arimidex on 09/06/17.  -Return to cancer center in mid-April 2019 for follow-up visit with labs to assess tolerance to AI.    All questions were answered to patient's stated satisfaction. Encouraged patient to call with any new  concerns or questions before her next visit to the cancer center and we can certain see her sooner, if needed.    Plan of care discussed with Dr. Sherrine Maples, who agrees with the above aforementioned.    Orders placed this encounter:  Orders Placed This Encounter  Procedures  . CBC with Differential/Platelet  . Comprehensive metabolic panel      Mike Craze, NP [place] [phone]

## 2017-08-01 NOTE — Patient Instructions (Addendum)
[place] at [facility] Discharge Instructions  RECOMMENDATIONS MADE BY THE CONSULTANT AND ANY TEST RESULTS WILL BE SENT TO YOUR REFERRING PHYSICIAN.  You were seen today by Mike Craze NP. Start taking your Aromasin on March 1st. Return in mid April for follow up.   Thank you for choosing [place] at [facility] to provide your oncology and hematology care.  To afford each patient quality time with our provider, please arrive at least 15 minutes before your scheduled appointment time.    If you have a lab appointment with the [facility] please come in thru the  Main Entrance and check in at the main information desk  You need to re-schedule your appointment should you arrive 10 or more minutes late.  We strive to give you quality time with our providers, and arriving late affects you and other patients whose appointments are after yours.  Also, if you no show three or more times for appointments you may be dismissed from the clinic at the providers discretion.     Again, thank you for choosing [facility].  Our hope is that these requests will decrease the amount of time that you wait before being seen by our physicians.       _____________________________________________________________  Should you have questions after your visit to [facility], please contact our office at [phone] between the hours of 8:30 a.m. and 4:30 p.m.  Voicemails left after 4:30 p.m. will not be returned until the following business day.  For prescription refill requests, have your pharmacy contact our office.       Resources For Cancer Patients and their Caregivers ? American Cancer Society: Can assist with transportation, wigs, general needs, runs Look Good Feel Better.        (386)717-3753 ? Cancer Care: Provides financial assistance, online support groups, medication/co-pay assistance.  1-800-813-HOPE  970-585-5124) ? [place] Assists [place] cancer patients and their families through emotional , educational and financial support.  (807)308-6867 ? [place] DSS Where to apply for food stamps, Medicaid and utility assistance. 860-863-8118 ? [facility]: Transportation to medical appointments. 218-557-9426 ? Social Security Administration: May apply for disability if have a Stage IV cancer. (864)361-5317 306-022-9891 ? [employer], Disability and Transit Services: Assists with nutrition, care and transit needs. [place] Support Programs: @10RELATIVEDAYS @ > Cancer Support Group  2nd Tuesday of the month 1pm-2pm, Journey Room  > Creative Journey  3rd Tuesday of the month 1130am-1pm, Journey Room  > Look Good Feel Better  1st Wednesday of the month 10am-12 noon, Journey Room (Call [place] to register [phone])

## 2017-08-02 NOTE — Progress Notes (Signed)
  Radiation Oncology         (336) [phone] ________________________________  Name: Marissa [name] MRN: [id number]  Date: 07/31/2017  DOB: Dec 26, 1945  End of Treatment Note  Diagnosis:   72 y.o. female with Stage IA [id number] Right Breast UOQ Invasive Ductal Carcinoma, ER 100% / PR 90% / Her2 Neg, Grade 1    Indication for treatment:  Curative       Radiation treatment dates:   07/03/2017 - 07/31/2017  Site/dose:   1. Right Breast / 40.05 Gy in 15 fractions 2. Right Breast Boost / 10 Gy in 5 fractions  Beams/energy:   1. 3D / 10X Photon 2. 3D / 10 and 15X Photon  Narrative: The patient tolerated radiation treatment relatively well.  She experienced mild fatigue and some expected radiation-related skin changes. Her skin remained intact, but she did have some erythema to her right breast. She is using Radiaplex twice daily as directed.  Plan: The patient has completed radiation treatment. The patient will return to radiation oncology clinic for routine followup in one month. I advised them to call or return sooner if they have any questions or concerns related to their recovery or treatment.  -----------------------------------  Eppie Gibson, MD  This document serves as a record of services personally performed by Eppie Gibson, MD. It was created on her behalf by Rae Lips, a trained medical scribe. The creation of this record is based on the scribe's personal observations and the provider's statements to them. This document has been checked and approved by the attending provider.

## 2017-08-08 DIAGNOSIS — B0232 Zoster iridocyclitis: Secondary | ICD-10-CM | POA: Diagnosis not present

## 2017-08-08 DIAGNOSIS — B0229 Other postherpetic nervous system involvement: Secondary | ICD-10-CM | POA: Diagnosis not present

## 2017-08-13 DIAGNOSIS — M25579 Pain in unspecified ankle and joints of unspecified foot: Secondary | ICD-10-CM | POA: Diagnosis not present

## 2017-08-13 DIAGNOSIS — M79672 Pain in left foot: Secondary | ICD-10-CM | POA: Diagnosis not present

## 2017-08-13 DIAGNOSIS — M722 Plantar fascial fibromatosis: Secondary | ICD-10-CM | POA: Diagnosis not present

## 2017-08-27 DIAGNOSIS — M722 Plantar fascial fibromatosis: Secondary | ICD-10-CM | POA: Diagnosis not present

## 2017-08-27 DIAGNOSIS — M79672 Pain in left foot: Secondary | ICD-10-CM | POA: Diagnosis not present

## 2017-08-27 DIAGNOSIS — M25579 Pain in unspecified ankle and joints of unspecified foot: Secondary | ICD-10-CM | POA: Diagnosis not present

## 2017-08-29 ENCOUNTER — Encounter: Payer: Self-pay | Admitting: Radiation Oncology

## 2017-09-03 ENCOUNTER — Ambulatory Visit
Admission: RE | Admit: 2017-09-03 | Discharge: 2017-09-03 | Disposition: A | Discharge status: Home / Self Care | Payer: Medicare Other | Source: Ambulatory Visit | Attending: Radiation Oncology | Admitting: Radiation Oncology

## 2017-09-03 ENCOUNTER — Encounter: Payer: Self-pay | Admitting: Radiation Oncology

## 2017-09-03 VITALS — BP 137/63 | HR 67 | Temp 98.0°F | Ht 65.0 in | Wt 231.8 lb

## 2017-09-03 DIAGNOSIS — Z923 Personal history of irradiation: Secondary | ICD-10-CM | POA: Diagnosis not present

## 2017-09-03 DIAGNOSIS — Z7982 Long term (current) use of aspirin: Secondary | ICD-10-CM | POA: Diagnosis not present

## 2017-09-03 DIAGNOSIS — Z79811 Long term (current) use of aromatase inhibitors: Secondary | ICD-10-CM | POA: Diagnosis not present

## 2017-09-03 DIAGNOSIS — C50411 Malignant neoplasm of upper-outer quadrant of right female breast: Secondary | ICD-10-CM

## 2017-09-03 DIAGNOSIS — Z170 Estrogen receptor positive status [ER+]: Secondary | ICD-10-CM | POA: Insufficient documentation

## 2017-09-03 DIAGNOSIS — Z79899 Other long term (current) drug therapy: Secondary | ICD-10-CM | POA: Diagnosis not present

## 2017-09-03 HISTORY — DX: Personal history of irradiation: Z92.3

## 2017-09-03 NOTE — Progress Notes (Signed)
Radiation Oncology         (336) [phone] ________________________________  Name: Marissa [name] MRN: [id number]  Date: 09/03/2017  DOB: 1945-12-21  Follow-Up Visit Note  Outpatient  CC: Monico Blitz, MD  Rolm Bookbinder, MD  Diagnosis and Prior Radiotherapy:    ICD-10-CM   1. Carcinoma of upper-outer quadrant of right breast in female, estrogen receptor positive ([place]) C50.411    Z17.0     CHIEF COMPLAINT: Here for follow-up and surveillance of breast cancer  Narrative:  The patient returns today for routine follow-up.  Completed RT last month.  She denies pain or fatigue. She feels well. Her Right Breast remains slightly red/hyperpigmented. She continues to use radiaplex 1-2 times daily. She will begin using a vitamin E lotion or oil when the radiaplex is complete. She plans to begin taking Arimidex 09/06/17 per [place] request. She will see medical onocology again on 10/24/17. She would like to know if she can begin swimming again, as this is something she really looks forward to.                               ALLERGIES:  is allergic to nitrostat [nitroglycerin].  Meds: Current Outpatient Medications  Medication Sig Dispense Refill  . amLODipine (NORVASC) 10 MG tablet Take 10 mg by mouth daily.    Marland Kitchen aspirin EC 81 MG tablet Take 81 mg by mouth daily.    . cholecalciferol (VITAMIN D) 1000 units tablet Take 2,000 Units by mouth daily.     . clonazePAM (KLONOPIN) 0.5 MG tablet Take 0.5 mg 2 (two) times daily as needed by mouth for anxiety.    Marland Kitchen co-enzyme Q-10 30 MG capsule Take 100 mg by mouth daily.    Marland Kitchen erythromycin ophthalmic ointment     . losartan-hydrochlorothiazide (HYZAAR) 100-25 MG per tablet Take 1 tablet by mouth every morning.     . metoprolol succinate (TOPROL-XL) 25 MG 24 hr tablet Take 25 mg by mouth every morning.     . Omega-3 Fatty Acids (OMEGA 3 500) 500 MG CAPS Take by mouth.    Marland Kitchen omeprazole (PRILOSEC) 20 MG capsule Take 20 mg by mouth daily.    .  valACYclovir (VALTREX) 1000 MG tablet TAKE 1 GRAM BY MOUTH THREE TIMES A DAY  0  . anastrozole (ARIMIDEX) 1 MG tablet Take 1 tablet (1 mg total) by mouth daily. (Patient not taking: Reported on 09/03/2017) 90 tablet 0   No current facility-administered medications for this encounter.     Physical Findings: The patient is in no acute distress. Patient is alert and oriented.  height is 5\' 5"  (1.651 m) and weight is 231 lb 12.8 oz (105.1 kg). Her temperature is 98 F (36.7 C). Her blood pressure is 137/63 and her pulse is 67. Her oxygen saturation is 100%. .    Satisfactory skin healing in radiotherapy fields. Mild erythema remains over right breast   Lab Findings: Lab Results  Component Value Date   WBC 5.2 08/01/2017   HGB 13.9 08/01/2017   HCT 42.3 08/01/2017   MCV 92.6 08/01/2017   PLT 202 08/01/2017    Radiographic Findings: No results found.  Impression/Plan: Healing well from radiotherapy to the breast tissue.  Continue skin care with topical Vitamin E Oil and / or lotion for at least 2 more months for further healing.  I encouraged her to continue with yearly mammography as appropriate (for intact breast tissue)  and followup with medical oncology. I will see her back on an as-needed basis. I have encouraged her to call if she has any issues or concerns in the future. I wished her the very best.  She is cleared to resume swimming. Exercise encouraged.   _____________________________________   Eppie Gibson, MD

## 2017-09-03 NOTE — Progress Notes (Signed)
Marissa [name] presents for follow up of radiation completed 07/31/17 to her Right Breast. She denies pain or fatigue. She feels well. Her Right Breast remains slightly red/hyperpigmented. She continues to use radiaplex 1-2 times daily. She will begin using a vitamin E lotion or oil when the radiaplex is complete. She plans to begin taking Arimidex 09/06/17 per [place] request. She will see medical onocology again on 10/24/17. She would like to know if she can begin swimming again, as this is something she really looks forward to.   BP 137/63   Pulse 67   Temp 98 F (36.7 C)   Ht 5\' 5"  (1.651 m)   Wt 231 lb 12.8 oz (105.1 kg)   SpO2 100% Comment: room air  BMI 38.57 kg/m    Wt Readings from Last 3 Encounters:  09/03/17 231 lb 12.8 oz (105.1 kg)  08/01/17 228 lb (103.4 kg)  06/13/17 233 lb 11.2 oz (106 kg)

## 2017-09-10 DIAGNOSIS — M79671 Pain in right foot: Secondary | ICD-10-CM | POA: Diagnosis not present

## 2017-09-10 DIAGNOSIS — M722 Plantar fascial fibromatosis: Secondary | ICD-10-CM | POA: Diagnosis not present

## 2017-09-10 DIAGNOSIS — M25579 Pain in unspecified ankle and joints of unspecified foot: Secondary | ICD-10-CM | POA: Diagnosis not present

## 2017-09-16 DIAGNOSIS — N183 Chronic kidney disease, stage 3 (moderate): Secondary | ICD-10-CM | POA: Diagnosis not present

## 2017-09-16 DIAGNOSIS — C50911 Malignant neoplasm of unspecified site of right female breast: Secondary | ICD-10-CM | POA: Diagnosis not present

## 2017-09-16 DIAGNOSIS — Z6837 Body mass index (BMI) 37.0-37.9, adult: Secondary | ICD-10-CM | POA: Diagnosis not present

## 2017-09-16 DIAGNOSIS — E7800 Pure hypercholesterolemia, unspecified: Secondary | ICD-10-CM | POA: Diagnosis not present

## 2017-09-16 DIAGNOSIS — Z789 Other specified health status: Secondary | ICD-10-CM | POA: Diagnosis not present

## 2017-09-16 DIAGNOSIS — Z299 Encounter for prophylactic measures, unspecified: Secondary | ICD-10-CM | POA: Diagnosis not present

## 2017-09-16 DIAGNOSIS — I10 Essential (primary) hypertension: Secondary | ICD-10-CM | POA: Diagnosis not present

## 2017-09-17 DIAGNOSIS — M79671 Pain in right foot: Secondary | ICD-10-CM | POA: Diagnosis not present

## 2017-09-17 DIAGNOSIS — M79672 Pain in left foot: Secondary | ICD-10-CM | POA: Diagnosis not present

## 2017-09-17 DIAGNOSIS — R937 Abnormal findings on diagnostic imaging of other parts of musculoskeletal system: Secondary | ICD-10-CM | POA: Diagnosis not present

## 2017-09-24 DIAGNOSIS — S92015A Nondisplaced fracture of body of left calcaneus, initial encounter for closed fracture: Secondary | ICD-10-CM | POA: Diagnosis not present

## 2017-09-24 DIAGNOSIS — M79672 Pain in left foot: Secondary | ICD-10-CM | POA: Diagnosis not present

## 2017-10-22 ENCOUNTER — Other Ambulatory Visit (HOSPITAL_COMMUNITY): Payer: Self-pay | Admitting: *Deleted

## 2017-10-22 DIAGNOSIS — C50419 Malignant neoplasm of upper-outer quadrant of unspecified female breast: Secondary | ICD-10-CM

## 2017-10-23 ENCOUNTER — Inpatient Hospital Stay (HOSPITAL_COMMUNITY): Payer: Medicare Other | Attending: Hematology

## 2017-10-23 ENCOUNTER — Encounter (HOSPITAL_COMMUNITY): Payer: Self-pay | Admitting: Hematology

## 2017-10-23 ENCOUNTER — Inpatient Hospital Stay (HOSPITAL_BASED_OUTPATIENT_CLINIC_OR_DEPARTMENT_OTHER): Payer: Medicare Other | Admitting: Hematology

## 2017-10-23 VITALS — BP 147/64 | HR 70 | Temp 98.2°F | Resp 16 | Wt 231.9 lb

## 2017-10-23 DIAGNOSIS — C50411 Malignant neoplasm of upper-outer quadrant of right female breast: Secondary | ICD-10-CM | POA: Diagnosis not present

## 2017-10-23 DIAGNOSIS — Z170 Estrogen receptor positive status [ER+]: Secondary | ICD-10-CM | POA: Diagnosis not present

## 2017-10-23 DIAGNOSIS — Z923 Personal history of irradiation: Secondary | ICD-10-CM | POA: Insufficient documentation

## 2017-10-23 DIAGNOSIS — Z79811 Long term (current) use of aromatase inhibitors: Secondary | ICD-10-CM | POA: Diagnosis not present

## 2017-10-23 DIAGNOSIS — C50419 Malignant neoplasm of upper-outer quadrant of unspecified female breast: Secondary | ICD-10-CM

## 2017-10-23 DIAGNOSIS — Z79899 Other long term (current) drug therapy: Secondary | ICD-10-CM | POA: Diagnosis not present

## 2017-10-23 LAB — CBC WITH DIFFERENTIAL/PLATELET
BASOS ABS: 0 10*3/uL (ref 0.0–0.1)
BASOS PCT: 0 %
EOS PCT: 3 %
Eosinophils Absolute: 0.2 10*3/uL (ref 0.0–0.7)
HEMATOCRIT: 41.8 % (ref 36.0–46.0)
Hemoglobin: 13.7 g/dL (ref 12.0–15.0)
Lymphocytes Relative: 21 %
Lymphs Abs: 1.5 10*3/uL (ref 0.7–4.0)
MCH: 30.6 pg (ref 26.0–34.0)
MCHC: 32.8 g/dL (ref 30.0–36.0)
MCV: 93.3 fL (ref 78.0–100.0)
MONO ABS: 0.5 10*3/uL (ref 0.1–1.0)
Monocytes Relative: 7 %
NEUTROS ABS: 5 10*3/uL (ref 1.7–7.7)
Neutrophils Relative %: 69 %
PLATELETS: 230 10*3/uL (ref 150–400)
RBC: 4.48 MIL/uL (ref 3.87–5.11)
RDW: 12.4 % (ref 11.5–15.5)
WBC: 7.1 10*3/uL (ref 4.0–10.5)

## 2017-10-23 LAB — COMPREHENSIVE METABOLIC PANEL
ALBUMIN: 3.9 g/dL (ref 3.5–5.0)
ALT: 15 U/L (ref 14–54)
AST: 18 U/L (ref 15–41)
Alkaline Phosphatase: 94 U/L (ref 38–126)
Anion gap: 9 (ref 5–15)
BUN: 21 mg/dL — AB (ref 6–20)
CHLORIDE: 102 mmol/L (ref 101–111)
CO2: 27 mmol/L (ref 22–32)
CREATININE: 1.05 mg/dL — AB (ref 0.44–1.00)
Calcium: 10.7 mg/dL — ABNORMAL HIGH (ref 8.9–10.3)
GFR calc Af Amer: >60 mL/min (ref >60)
GFR calc non Af Amer: 52 mL/min — ABNORMAL LOW (ref >60)
GLUCOSE: 105 mg/dL — AB (ref 65–99)
POTASSIUM: 4 mmol/L (ref 3.5–5.1)
Sodium: 138 mmol/L (ref 135–145)
Total Bilirubin: 0.6 mg/dL (ref 0.3–1.2)
Total Protein: 7.2 g/dL (ref 6.5–8.1)

## 2017-10-23 NOTE — Assessment & Plan Note (Signed)
1.  Right breast infiltrating ductal carcinoma: -Lumpectomy on 06/11/2017, 1.4 cm, 0 out of 2 sentinel lymph nodes positive, margins negative, ER/PR positive, HER-2 negative, Ki-67 of 5% -Oncotype DX recurrence score of 10 -Negative genetic testing for hereditary breast cancer -Radiation therapy finished in January 2019 -Anastrozole started on 09/06/2017, tolerating it very well, had 4-5 hot flashes so far, denies any musculoskeletal symptoms, denies any thinning of hair.  Her LFTs were within normal limits.  I will see her back in 4 months for follow-up.  She will have a mammogram done in November in [place].  Today her physical examination was within normal limits.  2.  Mild hypercalcemia: Calcium is slightly elevated at 10.7.  She does not take any calcium supplements.  She takes vitamin D.  We will check her intact PTH level prior to next visit.  3.  Bone density: She reportedly had bone density test last year at Dr. Trena Platt office.  This was reportedly normal.  We will try to obtain a copy of it.  He is taking vitamin D supplements.

## 2017-10-23 NOTE — Patient Instructions (Signed)
[facility] at [facility] Discharge Instructions  You saw Dr. Katragadda.   Thank you for choosing [facility] at [facility] to provide your oncology and hematology care.  To afford each patient quality time with our provider, please arrive at least 15 minutes before your scheduled appointment time.   If you have a lab appointment with the Cancer Center please come in thru the  Main Entrance and check in at the main information desk  You need to re-schedule your appointment should you arrive 10 or more minutes late.  We strive to give you quality time with our providers, and arriving late affects you and other patients whose appointments are after yours.  Also, if you no show three or more times for appointments you may be dismissed from the clinic at the providers discretion.     Again, thank you for choosing [facility].  Our hope is that these requests will decrease the amount of time that you wait before being seen by our physicians.       _____________________________________________________________  Should you have questions after your visit to [facility], please contact our office at [phone] between the hours of 8:30 a.m. and 4:30 p.m.  Voicemails left after 4:30 p.m. will not be returned until the following business day.  For prescription refill requests, have your pharmacy contact our office.       Resources For Cancer Patients and their Caregivers ? American Cancer Society: Can assist with transportation, wigs, general needs, runs Look Good Feel Better.        1-888-227-6333 ? Cancer Care: Provides financial assistance, online support groups, medication/co-pay assistance.  1-800-813-HOPE (4673) ? [facility] Assists [place] cancer patients and their families through emotional , educational and financial support.  336-427-4357 ? [place] DSS Where to apply for food  stamps, Medicaid and utility assistance. 336-342-1394 ? [facility]: Transportation to medical appointments. 336-347-2287 ? Social Security Administration: May apply for disability if have a Stage IV cancer. 336-342-7796 1-800-772-1213 ? [place] Aging, Disability and Transit Services: Assists with nutrition, care and transit needs. 336-349-2343  Cancer Center Support Programs:   > Cancer Support Group  2nd Tuesday of the month 1pm-2pm, Journey Room   > Creative Journey  3rd Tuesday of the month 1130am-1pm, Journey Room     

## 2017-10-23 NOTE — Progress Notes (Signed)
Patient Care Team: Monico Blitz, MD as PCP - General (Internal Medicine)  DIAGNOSIS:  Encounter Diagnoses  Name Primary?  . Carcinoma of upper-outer quadrant of right breast in female, estrogen receptor positive ([place]) Yes  . Hypercalcemia     SUMMARY OF ONCOLOGIC HISTORY:   Carcinoma of upper-outer quadrant of right breast in female, estrogen receptor positive ([place])   06/11/2017 Initial Diagnosis    Carcinoma of upper-outer quadrant of right breast in female, estrogen receptor positive ([place])      07/08/2017 Genetic Testing    Negative genetic testing on the Multi-cancer panel.  The Multi-Gene Panel offered by [facility] includes sequencing and/or deletion duplication testing of the following 83 genes: ALK, APC, ATM, AXIN2,BAP1,  BARD1, BLM, BMPR1A, BRCA1, BRCA2, BRIP1, CASR, CDC73, CDH1, CDK4, CDKN1B, CDKN1C, CDKN2A (p14ARF), CDKN2A (p16INK4a), CEBPA, CHEK2, CTNNA1, DICER1, DIS3L2, EGFR (c.2369C>T, p.Thr790Met variant only), EPCAM (Deletion/duplication testing only), FH, FLCN, GATA2, GPC3, GREM1 (Promoter region deletion/duplication testing only), HOXB13 (c.251G>A, p.Gly84Glu), HRAS, KIT, MAX, MEN1, MET, MITF (c.952G>A, p.Glu318Lys variant only), MLH1, MSH2, MSH3, MSH6, MUTYH, NBN, NF1, NF2, NTHL1, PALB2, PDGFRA, PHOX2B, PMS2, POLD1, POLE, POT1, PRKAR1A, PTCH1, PTEN, RAD50, RAD51C, RAD51D, RB1, RECQL4, RET, RUNX1, SDHAF2, SDHA (sequence changes only), SDHB, SDHC, SDHD, SMAD4, SMARCA4, SMARCB1, SMARCE1, STK11, SUFU, TERT, TERT, TMEM127, TP53, TSC1, TSC2, VHL, WRN and WT1.  The report date is July 08, 2017.        CHIEF COMPLIANT: Follow-up of right breast cancer.  INTERVAL HISTORY: Marissa [name] is a 72 year old very pleasant white female who is seen in follow-up for right breast cancer.  She started taking anastrozole on 09/06/2017.  She has tolerated it very well so far.  2 weeks after starting, she developed hot flashes about 4-5 so far.  She denies any muscular skeletal symptoms.   She is taking vitamin D supplements.  She does not take calcium.  She denies any new onset pains.  No fevers, night sweats or weight loss was reported.  She reportedly had a fracture of a bone in right heel.  The pain in the heel is getting better.  She is wearing a boot.   REVIEW OF SYSTEMS:   Constitutional: Denies fevers, chills or abnormal weight loss.  Rare hot flashes. Eyes: Denies blurriness of vision Ears, nose, mouth, throat, and face: Denies mucositis or sore throat Respiratory: Denies cough, dyspnea or wheezes Cardiovascular: Denies palpitation, chest discomfort Gastrointestinal:  Denies nausea, heartburn or change in bowel habits Skin: Denies abnormal skin rashes Lymphatics: Denies new lymphadenopathy or easy bruising Neurological:Denies numbness, tingling or new weaknesses Behavioral/Psych: Mood is stable, no new changes  Extremities: No lower extremity edema Breast: denies any pain or lumps or nodules in either breasts All other systems were reviewed with the patient and are negative.  I have reviewed the past medical history, past surgical history, social history and family history with the patient and they are unchanged from previous note.  ALLERGIES:  is allergic to nitrostat [nitroglycerin].  MEDICATIONS:  Current Outpatient Medications  Medication Sig Dispense Refill  . amLODipine (NORVASC) 10 MG tablet Take 10 mg by mouth daily.    Marland Kitchen anastrozole (ARIMIDEX) 1 MG tablet Take 1 tablet (1 mg total) by mouth daily. 90 tablet 0  . aspirin EC 81 MG tablet Take 81 mg by mouth daily.    . cholecalciferol (VITAMIN D) 1000 units tablet Take 2,000 Units by mouth daily.     Marland Kitchen co-enzyme Q-10 30 MG capsule Take 100 mg by mouth daily.    Marland Kitchen  diclofenac (VOLTAREN) 75 MG EC tablet     . losartan-hydrochlorothiazide (HYZAAR) 100-25 MG per tablet Take 1 tablet by mouth every morning.     . metoprolol succinate (TOPROL-XL) 25 MG 24 hr tablet Take 25 mg by mouth every morning.     .  Omega-3 Fatty Acids (OMEGA 3 500) 500 MG CAPS Take by mouth.    Marland Kitchen omeprazole (PRILOSEC) 20 MG capsule Take 20 mg by mouth daily.     No current facility-administered medications for this visit.     PHYSICAL EXAMINATION: ECOG PERFORMANCE STATUS: 1 - Symptomatic but completely ambulatory  Vitals:   10/23/17 1448  BP: (!) 147/64  Pulse: 70  Resp: 16  Temp: 98.2 F (36.8 C)  SpO2: 95%   Filed Weights   10/23/17 1448  Weight: 231 lb 14.4 oz (105.2 kg)    GENERAL:alert, no distress and comfortable SKIN: skin color, texture, turgor are normal, no rashes or significant lesions EYES: normal, Conjunctiva are pink and non-injected, sclera clear  NECK: supple, thyroid normal size, non-tender, without nodularity LYMPH:  no palpable lymphadenopathy in the cervical, axillary or inguinal LUNGS: clear to auscultation and percussion with normal breathing effort HEART: regular rate & rhythm and no murmurs and no lower extremity edema ABDOMEN:abdomen soft, non-tender and normal bowel sounds EXTREMITIES: No lower extremity edema in the left leg.  Right leg is in a boot. BREAST: Right breast upper outer quadrant scar is within normal limits.  No palpable masses in bilateral breasts.  No palpable axillary adenopathy.  Right breast has very mild lymphedema. LABORATORY DATA:  I have reviewed the data as listed CMP Latest Ref Rng & Units 10/23/2017 08/01/2017 05/17/2017  Glucose 65 - 99 mg/dL 105(H) 94 80  BUN 6 - 20 mg/dL 21(H) 22(H) 22(H)  Creatinine 0.44 - 1.00 mg/dL 1.05(H) 1.01(H) 0.95  Sodium 135 - 145 mmol/L 138 135 138  Potassium 3.5 - 5.1 mmol/L 4.0 3.4(L) 3.8  Chloride 101 - 111 mmol/L 102 99(L) 102  CO2 22 - 32 mmol/L _0 Calcium 8.9 - 10.3 mg/dL 10.7(H) 10.4(H) 9.8  Total Protein 6.5 - 8.1 g/dL 7.2 7.0 -  Total Bilirubin 0.3 - 1.2 mg/dL 0.6 0.8 -  Alkaline Phos 38 - 126 U/L 94 72 -  AST 15 - 41 U/L 18 18 -  ALT 14 - 54 U/L 15 14 -   No results found for: KVQ259   Lab  Results  Component Value Date   WBC 7.1 10/23/2017   HGB 13.7 10/23/2017   HCT 41.8 10/23/2017   MCV 93.3 10/23/2017   PLT 230 10/23/2017   NEUTROABS 5.0 10/23/2017    ASSESSMENT & PLAN:  Carcinoma of upper-outer quadrant of right breast in female, estrogen receptor positive ([place]) 1.  Right breast infiltrating ductal carcinoma: -Lumpectomy on 06/11/2017, 1.4 cm, 0 out of 2 sentinel lymph nodes positive, margins negative, ER/PR positive, HER-2 negative, Ki-67 of 5% -Oncotype DX recurrence score of 10 -Negative genetic testing for hereditary breast cancer -Radiation therapy finished in January 2019 -Anastrozole started on 09/06/2017, tolerating it very well, had 4-5 hot flashes so far, denies any musculoskeletal symptoms, denies any thinning of hair.  Her LFTs were within normal limits.  I will see her back in 4 months for follow-up.  She will have a mammogram done in November in [place].  Today her physical examination was within normal limits.  2.  Mild hypercalcemia: Calcium is slightly elevated at 10.7.  She does not take any  calcium supplements.  She takes vitamin D.  We will check her intact PTH level prior to next visit.  3.  Bone density: She reportedly had bone density test last year at Dr. Trena Platt office.  This was reportedly normal.  We will try to obtain a copy of it.  He is taking vitamin D supplements.    Breast Cancer therapy associated bone loss: I have recommended to continue vitamin D and weight bearing exercises.   Orders Placed This Encounter  Procedures  . CBC    Standing Status:   Future    Standing Expiration Date:   10/23/2018  . Comprehensive metabolic panel    Standing Status:   Future    Standing Expiration Date:   10/23/2018  . Cancer antigen 15-3    Standing Status:   Future    Standing Expiration Date:   10/23/2018  . PTH, intact and calcium    Standing Status:   Future    Standing Expiration Date:   10/23/2018   The patient has a good understanding  of the overall plan. she agrees with it. she will call with any problems that may develop before the next visit here.   Derek Jack, MD 10/23/17

## 2017-10-24 ENCOUNTER — Other Ambulatory Visit (HOSPITAL_COMMUNITY): Payer: Medicare Other

## 2017-10-24 ENCOUNTER — Ambulatory Visit (HOSPITAL_COMMUNITY): Payer: Medicare Other | Admitting: Internal Medicine

## 2017-10-29 DIAGNOSIS — S92015D Nondisplaced fracture of body of left calcaneus, subsequent encounter for fracture with routine healing: Secondary | ICD-10-CM | POA: Diagnosis not present

## 2017-10-29 DIAGNOSIS — M79672 Pain in left foot: Secondary | ICD-10-CM | POA: Diagnosis not present

## 2017-11-14 DIAGNOSIS — H43812 Vitreous degeneration, left eye: Secondary | ICD-10-CM | POA: Diagnosis not present

## 2017-11-18 ENCOUNTER — Other Ambulatory Visit (HOSPITAL_COMMUNITY): Payer: Self-pay

## 2017-11-18 DIAGNOSIS — C50411 Malignant neoplasm of upper-outer quadrant of right female breast: Secondary | ICD-10-CM

## 2017-11-18 DIAGNOSIS — Z170 Estrogen receptor positive status [ER+]: Principal | ICD-10-CM

## 2017-11-18 MED ORDER — ANASTROZOLE 1 MG PO TABS: 1.0000 mg | ORAL_TABLET | Freq: Every day | ORAL | 1 refills | Status: DC

## 2017-11-18 NOTE — Telephone Encounter (Signed)
Patient called requesting refill for Arimidex. Reviewed with provider, chart checked and refilled.

## 2018-02-07 ENCOUNTER — Inpatient Hospital Stay (HOSPITAL_COMMUNITY): Payer: Medicare Other | Attending: Hematology

## 2018-02-07 DIAGNOSIS — Z170 Estrogen receptor positive status [ER+]: Secondary | ICD-10-CM | POA: Insufficient documentation

## 2018-02-07 DIAGNOSIS — Z79811 Long term (current) use of aromatase inhibitors: Secondary | ICD-10-CM | POA: Insufficient documentation

## 2018-02-07 DIAGNOSIS — I890 Lymphedema, not elsewhere classified: Secondary | ICD-10-CM | POA: Insufficient documentation

## 2018-02-07 DIAGNOSIS — Z923 Personal history of irradiation: Secondary | ICD-10-CM | POA: Insufficient documentation

## 2018-02-07 DIAGNOSIS — C50411 Malignant neoplasm of upper-outer quadrant of right female breast: Secondary | ICD-10-CM | POA: Diagnosis not present

## 2018-02-07 LAB — CBC
HCT: 41 % (ref 36.0–46.0)
Hemoglobin: 14 g/dL (ref 12.0–15.0)
MCH: 31.5 pg (ref 26.0–34.0)
MCHC: 34.1 g/dL (ref 30.0–36.0)
MCV: 92.1 fL (ref 78.0–100.0)
PLATELETS: 224 10*3/uL (ref 150–400)
RBC: 4.45 MIL/uL (ref 3.87–5.11)
RDW: 12.3 % (ref 11.5–15.5)
WBC: 7.4 10*3/uL (ref 4.0–10.5)

## 2018-02-07 LAB — COMPREHENSIVE METABOLIC PANEL
ALT: 16 U/L (ref 0–44)
AST: 19 U/L (ref 15–41)
Albumin: 3.9 g/dL (ref 3.5–5.0)
Alkaline Phosphatase: 99 U/L (ref 38–126)
Anion gap: 7 (ref 5–15)
BUN: 22 mg/dL (ref 8–23)
CALCIUM: 10.2 mg/dL (ref 8.9–10.3)
CHLORIDE: 103 mmol/L (ref 98–111)
CO2: 27 mmol/L (ref 22–32)
CREATININE: 1.04 mg/dL — AB (ref 0.44–1.00)
GFR calc Af Amer: >60 mL/min (ref >60)
GFR calc non Af Amer: 53 mL/min — ABNORMAL LOW (ref >60)
GLUCOSE: 97 mg/dL (ref 70–99)
Potassium: 3.7 mmol/L (ref 3.5–5.1)
SODIUM: 137 mmol/L (ref 135–145)
Total Bilirubin: 0.8 mg/dL (ref 0.3–1.2)
Total Protein: 7.1 g/dL (ref 6.5–8.1)

## 2018-02-09 LAB — CANCER ANTIGEN 15-3: CA 15-3: 6.3 U/mL (ref 0.0–25.0)

## 2018-02-11 ENCOUNTER — Other Ambulatory Visit (HOSPITAL_COMMUNITY): Payer: Medicare Other

## 2018-02-11 LAB — PTH, INTACT AND CALCIUM
CALCIUM TOTAL (PTH): 10.4 mg/dL — AB (ref 8.7–10.3)
PTH: 32 pg/mL (ref 15–65)

## 2018-02-14 ENCOUNTER — Encounter (HOSPITAL_COMMUNITY): Payer: Self-pay | Admitting: Hematology

## 2018-02-14 ENCOUNTER — Other Ambulatory Visit (HOSPITAL_COMMUNITY): Payer: Medicare Other

## 2018-02-14 ENCOUNTER — Other Ambulatory Visit: Payer: Self-pay

## 2018-02-14 ENCOUNTER — Inpatient Hospital Stay (HOSPITAL_BASED_OUTPATIENT_CLINIC_OR_DEPARTMENT_OTHER): Payer: Medicare Other | Admitting: Hematology

## 2018-02-14 VITALS — BP 147/59 | HR 66 | Temp 97.7°F | Resp 16 | Wt 231.0 lb

## 2018-02-14 DIAGNOSIS — C50411 Malignant neoplasm of upper-outer quadrant of right female breast: Secondary | ICD-10-CM

## 2018-02-14 DIAGNOSIS — Z79811 Long term (current) use of aromatase inhibitors: Secondary | ICD-10-CM | POA: Diagnosis not present

## 2018-02-14 DIAGNOSIS — Z923 Personal history of irradiation: Secondary | ICD-10-CM

## 2018-02-14 DIAGNOSIS — I890 Lymphedema, not elsewhere classified: Secondary | ICD-10-CM

## 2018-02-14 DIAGNOSIS — Z170 Estrogen receptor positive status [ER+]: Secondary | ICD-10-CM | POA: Diagnosis not present

## 2018-02-14 MED ORDER — ANASTROZOLE 1 MG PO TABS: 1.0000 mg | ORAL_TABLET | Freq: Every day | ORAL | 3 refills | Status: DC

## 2018-02-14 NOTE — Patient Instructions (Signed)
[facility] at [facility] Discharge Instructions Follow up in 4 months with labs    Thank you for choosing [facility] at [facility] to provide your oncology and hematology care.  To afford each patient quality time with our provider, please arrive at least 15 minutes before your scheduled appointment time.   If you have a lab appointment with the Cancer Center please come in thru the  Main Entrance and check in at the main information desk  You need to re-schedule your appointment should you arrive 10 or more minutes late.  We strive to give you quality time with our providers, and arriving late affects you and other patients whose appointments are after yours.  Also, if you no show three or more times for appointments you may be dismissed from the clinic at the providers discretion.     Again, thank you for choosing [facility].  Our hope is that these requests will decrease the amount of time that you wait before being seen by our physicians.       _____________________________________________________________  Should you have questions after your visit to [facility], please contact our office at [phone] between the hours of 8:00 a.m. and 4:30 p.m.  Voicemails left after 4:00 p.m. will not be returned until the following business day.  For prescription refill requests, have your pharmacy contact our office and allow 72 hours.    Cancer Center Support Programs:   > Cancer Support Group  2nd Tuesday of the month 1pm-2pm, Journey Room    

## 2018-02-14 NOTE — Progress Notes (Signed)
[place], [place] [address]   CLINIC:  Medical Oncology/Hematology  PCP:  Monico Blitz, [place] [address] [phone]   REASON FOR VISIT:  Follow-up for right breast cancer,  CURRENT THERAPY: Arimidex  BRIEF ONCOLOGIC HISTORY:    Carcinoma of upper-outer quadrant of right breast in female, estrogen receptor positive ([place])   06/11/2017 Initial Diagnosis    Carcinoma of upper-outer quadrant of right breast in female, estrogen receptor positive ([place])    07/08/2017 Genetic Testing    Negative genetic testing on the Multi-cancer panel.  The Multi-Gene Panel offered by [facility] includes sequencing and/or deletion duplication testing of the following 83 genes: ALK, APC, ATM, AXIN2,BAP1,  BARD1, BLM, BMPR1A, BRCA1, BRCA2, BRIP1, CASR, CDC73, CDH1, CDK4, CDKN1B, CDKN1C, CDKN2A (p14ARF), CDKN2A (p16INK4a), CEBPA, CHEK2, CTNNA1, DICER1, DIS3L2, EGFR (c.2369C>T, p.Thr790Met variant only), EPCAM (Deletion/duplication testing only), FH, FLCN, GATA2, GPC3, GREM1 (Promoter region deletion/duplication testing only), HOXB13 (c.251G>A, p.Gly84Glu), HRAS, KIT, MAX, MEN1, MET, MITF (c.952G>A, p.Glu318Lys variant only), MLH1, MSH2, MSH3, MSH6, MUTYH, NBN, NF1, NF2, NTHL1, PALB2, PDGFRA, PHOX2B, PMS2, POLD1, POLE, POT1, PRKAR1A, PTCH1, PTEN, RAD50, RAD51C, RAD51D, RB1, RECQL4, RET, RUNX1, SDHAF2, SDHA (sequence changes only), SDHB, SDHC, SDHD, SMAD4, SMARCA4, SMARCB1, SMARCE1, STK11, SUFU, TERT, TERT, TMEM127, TP53, TSC1, TSC2, VHL, WRN and WT1.  The report date is July 08, 2017.       CANCER STAGING: Cancer Staging Carcinoma of upper-outer quadrant of right breast in female, estrogen receptor positive ([facility]) Staging form: Breast, AJCC 8th Edition - Clinical: cT1c, cN0 - Unsigned - Pathologic: Stage IA (pT1c, pN0, cM0, G1, ER: Positive, PR: Positive, HER2: Negative, Oncotype DX score: 11) - Signed by Twana First, MD on 06/13/2017    INTERVAL  HISTORY:  Marissa [name] 72 y.o. female returns for routine follow-up breast cancer. Patient has been doing well since her last visit. She is taking her Arimidex as prescribed. She does have hot flashes about one per day but no worse than when she started. She has noticed a thickening of her right breast that she had radiation in. She denies pain. Patient is remaining active during the day. Patient lives at home and performs all her own ADLs. She denies any new pains. Denies any new lumps found. She denies any fevers, chills, or any recent infections.     REVIEW OF SYSTEMS:  Review of Systems  Constitutional: Negative.   HENT:  Negative.   Eyes: Negative.   Respiratory: Negative.   Cardiovascular: Positive for leg swelling.  Gastrointestinal: Negative.   Endocrine: Negative.   Genitourinary: Negative.    Musculoskeletal: Negative.   Skin: Negative.   Neurological: Negative.   Hematological: Bruises/bleeds easily.  Psychiatric/Behavioral: Negative.      PAST MEDICAL/SURGICAL HISTORY:  Past Medical History:  Diagnosis Date  . Anxiety   . Cancer ([place]) 05/2017   right breast cancer  . Family history of breast cancer   . Family history of ovarian cancer   . Family history of stomach cancer   . GERD (gastroesophageal reflux disease)   . High cholesterol   . History of radiation therapy 07/03/17- 07/31/2017   Right Breast/ 40/05 Gy in 15 fractions, Right Breast boost/ 10 Gy in 5 fractions.   . Hypertension    Past Surgical History:  Procedure Laterality Date  . CARDIAC CATHETERIZATION     2008- negative findings  . CATARACT EXTRACTION W/PHACO Right 09/06/2014   Procedure: CATARACT EXTRACTION PHACO AND INTRAOCULAR LENS PLACEMENT; CDE:  3.85;  Surgeon: Williams Che, MD;  Location: [facility];  Service: Ophthalmology;  Laterality: Right;  . CATARACT EXTRACTION W/PHACO Left 12/13/2014   Procedure: CATARACT EXTRACTION PHACO AND INTRAOCULAR LENS PLACEMENT LEFT EYE CDE=10.79;  Surgeon: Williams Che, MD;  Location: [facility];  Service: Ophthalmology;  Laterality: Left;  . ESOPHAGOGASTRODUODENOSCOPY N/A 05/05/2014   Procedure: ESOPHAGOGASTRODUODENOSCOPY (EGD);  Surgeon: Rogene Houston, MD;  Location: [facility];  Service: Endoscopy;  Laterality: N/A;  255  . EUS N/A 06/10/2014   Procedure: UPPER ENDOSCOPIC ULTRASOUND (EUS) LINEAR;  Surgeon: Milus Banister, MD;  Location: [facility];  Service: Endoscopy;  Laterality: N/A;  . RADIOACTIVE SEED GUIDED PARTIAL MASTECTOMY WITH AXILLARY SENTINEL LYMPH NODE BIOPSY Right 05/20/2017   Procedure: RIGHT BREAST LUMPECTOMY WITH RADIOACTIVE SEED AND SENTINEL LYMPH NODE BIOPSY;  Surgeon: Rolm Bookbinder, MD;  Location: [facility];  Service: General;  Laterality: Right;  . TUBAL LIGATION       SOCIAL HISTORY:  Social History   Socioeconomic History  . Marital status: Married    Spouse name: Not on file  . Number of children: 2  . Years of education: Not on file  . Highest education level: Not on file  Occupational History  . Occupation: retired  Scientific laboratory technician  . Financial resource strain: Not on file  . Food insecurity:    Worry: Not on file    Inability: Not on file  . Transportation needs:    Medical: Not on file    Non-medical: Not on file  Tobacco Use  . Smoking status: Never Smoker  . Smokeless tobacco: Never Used  Substance and Sexual Activity  . Alcohol use: No  . Drug use: No  . Sexual activity: Yes    Birth control/protection: Post-menopausal  Lifestyle  . Physical activity:    Days per week: Not on file    Minutes per session: Not on file  . Stress: Not on file  Relationships  . Social connections:    Talks on phone: Not on file    Gets together: Not on file    Attends religious service: Not on file    Active member of club or organization: Not on file    Attends meetings of clubs or organizations: Not on file    Relationship status: Not on file  . Intimate partner violence:    Fear of  current or ex partner: Not on file    Emotionally abused: Not on file    Physically abused: Not on file    Forced sexual activity: Not on file  Other Topics Concern  . Not on file  Social History Narrative  . Not on file    FAMILY HISTORY:  Family History  Problem Relation Age of Onset  . CAD Mother 54  . Leukemia Father   . Parkinson's disease Father   . Breast cancer Daughter 50       BRCA neg  . Heart Problems Maternal Aunt   . Cancer Maternal Uncle        NOS  . Cancer Paternal Uncle        NOS  . Diabetes Maternal Grandfather   . Liver cancer Maternal Grandfather   . Cancer Paternal Grandmother        NOS  . Brain cancer Maternal Aunt   . Lung cancer Maternal Aunt        smoker  . Cancer Maternal Uncle        NOS  .  Stomach cancer Maternal Uncle   . Lung cancer Maternal Uncle        smoker  . Breast cancer Daughter        BRCA neg  . Breast cancer Cousin        mat first cousin  . Breast cancer Cousin        mat first cousin dx under 55  . Breast cancer Cousin        2 additional mat first cousins  . Ovarian cancer Cousin        mat first cousin  . Cancer Cousin        several female mat first cousins with cancer NOS  . Breast cancer Cousin        2 pat first cousins  . Cancer Cousin        3 pat first cousins with cancer NOS    CURRENT MEDICATIONS:  Outpatient Encounter Medications as of 02/14/2018  Medication Sig Note  . amLODipine (NORVASC) 10 MG tablet Take 10 mg by mouth daily.   Marland Kitchen anastrozole (ARIMIDEX) 1 MG tablet Take 1 tablet (1 mg total) by mouth daily.   Marland Kitchen aspirin EC 81 MG tablet Take 81 mg by mouth daily.   . cholecalciferol (VITAMIN D) 1000 units tablet Take 2,000 Units by mouth daily.    Marland Kitchen co-enzyme Q-10 30 MG capsule Take 100 mg by mouth daily.   . diclofenac (VOLTAREN) 75 MG EC tablet    . losartan-hydrochlorothiazide (HYZAAR) 100-25 MG per tablet Take 1 tablet by mouth every morning.  06/25/2016: Patient was advised to take an additional  tablet due to Blood pressure levels  . metoprolol succinate (TOPROL-XL) 25 MG 24 hr tablet Take 25 mg by mouth every morning.    . Omega-3 Fatty Acids (OMEGA 3 500) 500 MG CAPS Take by mouth.   Marland Kitchen omeprazole (PRILOSEC) 20 MG capsule Take 20 mg by mouth daily.    No facility-administered encounter medications on file as of 02/14/2018.     ALLERGIES:  Allergies  Allergen Reactions  . Nitrostat [Nitroglycerin] Shortness Of Breath     PHYSICAL EXAM:  ECOG Performance status: 1  Vitals:   02/14/18 0825  BP: (!) 147/59  Pulse: 66  Resp: 16  Temp: 97.7 F (36.5 C)  SpO2: 99%   Filed Weights   02/14/18 0825  Weight: 231 lb (104.8 kg)    Physical Exam Breast exam: Right breast lumpectomy scar in the upper outer quadrant is unchanged.  No palpable masses in bilateral breast.  There is very mild right lower breast lymphedema.  No palpable axillary adenopathy. Abdomen: No hepatosplenomegaly or masses palpable. Extremities: No edema or cyanosis.   LABORATORY DATA:  I have reviewed the labs as listed.  CBC    Component Value Date/Time   WBC 7.4 02/07/2018 1003   RBC 4.45 02/07/2018 1003   HGB 14.0 02/07/2018 1003   HCT 41.0 02/07/2018 1003   PLT 224 02/07/2018 1003   MCV 92.1 02/07/2018 1003   MCH 31.5 02/07/2018 1003   MCHC 34.1 02/07/2018 1003   RDW 12.3 02/07/2018 1003   LYMPHSABS 1.5 10/23/2017 1355   MONOABS 0.5 10/23/2017 1355   EOSABS 0.2 10/23/2017 1355   BASOSABS 0.0 10/23/2017 1355   CMP Latest Ref Rng & Units 02/07/2018 02/07/2018 10/23/2017  Glucose 70 - 99 mg/dL 97 - 105(H)  BUN 8 - 23 mg/dL 22 - 21(H)  Creatinine 0.44 - 1.00 mg/dL 1.04(H) - 1.05(H)  Sodium 135 - 145  mmol/L 137 - 138  Potassium 3.5 - 5.1 mmol/L 3.7 - 4.0  Chloride 98 - 111 mmol/L 103 - 102  CO2 22 - 32 mmol/L 27 - 27  Calcium 8.7 - 10.3 mg/dL 10.2 10.4(H) 10.7(H)  Total Protein 6.5 - 8.1 g/dL 7.1 - 7.2  Total Bilirubin 0.3 - 1.2 mg/dL 0.8 - 0.6  Alkaline Phos 38 - 126 U/L 99 - 94  AST 15 -  41 U/L 19 - 18  ALT 0 - 44 U/L 16 - 15       ASSESSMENT & PLAN:   Carcinoma of upper-outer quadrant of right breast in female, estrogen receptor positive ([place] [facility]) 1.  Right breast infiltrating ductal carcinoma: -Lumpectomy on 06/11/2017, 1.4 cm, 0 out of 2 sentinel lymph nodes positive, margins negative, ER/PR positive, HER-2 negative, Ki-67 of 5% -Oncotype DX recurrence score of 10 -Negative genetic testing for hereditary breast cancer -Radiation therapy finished in January 2019 -Anastrozole started on 09/06/2017, tolerating it very well, has 1 hot flash per day which is stable.  Does not have any musculoskeletal pains.  Reports some thickening of the right breast in the lower quadrant. - On examination today there is mild lymphedema in the lower quadrant of the right breast.  No palpable masses. -She will continue anastrozole and return to the clinic in 4 months for follow-up.  She has mammogram scheduled in November at [facility].  2.  Mild hypercalcemia: Previously she had mild hypercalcemia.  Calcium has normalized during this visit.  PTH was within normal limits.  She is not on calcium supplements.  She takes vitamin D daily.  3.  Bone density: She reportedly had bone density test last year at Dr. Trena Platt office.  This was reportedly normal.  We will try to obtain a copy of it.        Orders placed this encounter:  Orders Placed This Encounter  Procedures  . CBC with Differential/Platelet  . Comprehensive metabolic panel      Derek Jack, MD [place] [phone]

## 2018-02-14 NOTE — Assessment & Plan Note (Signed)
1.  Right breast infiltrating ductal carcinoma: -Lumpectomy on 06/11/2017, 1.4 cm, 0 out of 2 sentinel lymph nodes positive, margins negative, ER/PR positive, HER-2 negative, Ki-67 of 5% -Oncotype DX recurrence score of 10 -Negative genetic testing for hereditary breast cancer -Radiation therapy finished in January 2019 -Anastrozole started on 09/06/2017, tolerating it very well, has 1 hot flash per day which is stable.  Does not have any musculoskeletal pains.  Reports some thickening of the right breast in the lower quadrant. - On examination today there is mild lymphedema in the lower quadrant of the right breast.  No palpable masses. -She will continue anastrozole and return to the clinic in 4 months for follow-up.  She has mammogram scheduled in November at [facility].  2.  Mild hypercalcemia: Previously she had mild hypercalcemia.  Calcium has normalized during this visit.  PTH was within normal limits.  She is not on calcium supplements.  She takes vitamin D daily.  3.  Bone density: She reportedly had bone density test last year at Dr. Trena Platt office.  This was reportedly normal.  We will try to obtain a copy of it.

## 2018-02-21 ENCOUNTER — Ambulatory Visit (HOSPITAL_COMMUNITY): Payer: Medicare Other | Admitting: Internal Medicine

## 2018-03-18 DIAGNOSIS — I10 Essential (primary) hypertension: Secondary | ICD-10-CM | POA: Diagnosis not present

## 2018-03-18 DIAGNOSIS — E7800 Pure hypercholesterolemia, unspecified: Secondary | ICD-10-CM | POA: Diagnosis not present

## 2018-03-19 DIAGNOSIS — Z299 Encounter for prophylactic measures, unspecified: Secondary | ICD-10-CM | POA: Diagnosis not present

## 2018-03-19 DIAGNOSIS — I10 Essential (primary) hypertension: Secondary | ICD-10-CM | POA: Diagnosis not present

## 2018-03-19 DIAGNOSIS — J019 Acute sinusitis, unspecified: Secondary | ICD-10-CM | POA: Diagnosis not present

## 2018-03-19 DIAGNOSIS — Z6838 Body mass index (BMI) 38.0-38.9, adult: Secondary | ICD-10-CM | POA: Diagnosis not present

## 2018-03-19 DIAGNOSIS — Z713 Dietary counseling and surveillance: Secondary | ICD-10-CM | POA: Diagnosis not present

## 2018-04-09 DIAGNOSIS — Z23 Encounter for immunization: Secondary | ICD-10-CM | POA: Diagnosis not present

## 2018-04-17 DIAGNOSIS — I10 Essential (primary) hypertension: Secondary | ICD-10-CM | POA: Diagnosis not present

## 2018-04-17 DIAGNOSIS — Z79899 Other long term (current) drug therapy: Secondary | ICD-10-CM | POA: Diagnosis not present

## 2018-04-17 DIAGNOSIS — E7800 Pure hypercholesterolemia, unspecified: Secondary | ICD-10-CM | POA: Diagnosis not present

## 2018-04-17 DIAGNOSIS — R42 Dizziness and giddiness: Secondary | ICD-10-CM | POA: Diagnosis not present

## 2018-04-17 DIAGNOSIS — Z8249 Family history of ischemic heart disease and other diseases of the circulatory system: Secondary | ICD-10-CM | POA: Diagnosis not present

## 2018-04-17 DIAGNOSIS — R110 Nausea: Secondary | ICD-10-CM | POA: Diagnosis not present

## 2018-04-17 DIAGNOSIS — Z7982 Long term (current) use of aspirin: Secondary | ICD-10-CM | POA: Diagnosis not present

## 2018-05-05 ENCOUNTER — Other Ambulatory Visit (HOSPITAL_COMMUNITY): Payer: Self-pay | Admitting: Hematology

## 2018-05-05 DIAGNOSIS — C50411 Malignant neoplasm of upper-outer quadrant of right female breast: Secondary | ICD-10-CM

## 2018-05-05 DIAGNOSIS — Z170 Estrogen receptor positive status [ER+]: Principal | ICD-10-CM

## 2018-05-08 DIAGNOSIS — I10 Essential (primary) hypertension: Secondary | ICD-10-CM | POA: Diagnosis not present

## 2018-05-08 DIAGNOSIS — E7800 Pure hypercholesterolemia, unspecified: Secondary | ICD-10-CM | POA: Diagnosis not present

## 2018-05-12 ENCOUNTER — Ambulatory Visit
Admission: RE | Admit: 2018-05-12 | Discharge: 2018-05-12 | Disposition: A | Discharge status: Home / Self Care | Payer: Medicare Other | Source: Ambulatory Visit | Attending: Hematology | Admitting: Hematology

## 2018-05-12 DIAGNOSIS — R928 Other abnormal and inconclusive findings on diagnostic imaging of breast: Secondary | ICD-10-CM | POA: Diagnosis not present

## 2018-05-12 DIAGNOSIS — Z853 Personal history of malignant neoplasm of breast: Secondary | ICD-10-CM | POA: Diagnosis not present

## 2018-05-12 DIAGNOSIS — Z170 Estrogen receptor positive status [ER+]: Principal | ICD-10-CM

## 2018-05-12 DIAGNOSIS — C50411 Malignant neoplasm of upper-outer quadrant of right female breast: Secondary | ICD-10-CM

## 2018-05-12 HISTORY — DX: Personal history of irradiation: Z92.3

## 2018-06-03 DIAGNOSIS — I10 Essential (primary) hypertension: Secondary | ICD-10-CM | POA: Diagnosis not present

## 2018-06-03 DIAGNOSIS — E7800 Pure hypercholesterolemia, unspecified: Secondary | ICD-10-CM | POA: Diagnosis not present

## 2018-06-10 ENCOUNTER — Inpatient Hospital Stay (HOSPITAL_COMMUNITY): Payer: Medicare Other | Attending: Hematology

## 2018-06-10 DIAGNOSIS — I10 Essential (primary) hypertension: Secondary | ICD-10-CM | POA: Insufficient documentation

## 2018-06-10 DIAGNOSIS — Z79899 Other long term (current) drug therapy: Secondary | ICD-10-CM | POA: Insufficient documentation

## 2018-06-10 DIAGNOSIS — C50411 Malignant neoplasm of upper-outer quadrant of right female breast: Secondary | ICD-10-CM | POA: Insufficient documentation

## 2018-06-10 DIAGNOSIS — Z7982 Long term (current) use of aspirin: Secondary | ICD-10-CM | POA: Insufficient documentation

## 2018-06-10 DIAGNOSIS — Z923 Personal history of irradiation: Secondary | ICD-10-CM | POA: Diagnosis not present

## 2018-06-10 DIAGNOSIS — Z170 Estrogen receptor positive status [ER+]: Secondary | ICD-10-CM | POA: Diagnosis not present

## 2018-06-10 LAB — CBC WITH DIFFERENTIAL/PLATELET
Abs Immature Granulocytes: 0.01 10*3/uL (ref 0.00–0.07)
BASOS ABS: 0 10*3/uL (ref 0.0–0.1)
Basophils Relative: 0 %
EOS ABS: 0.1 10*3/uL (ref 0.0–0.5)
Eosinophils Relative: 2 %
HEMATOCRIT: 41.9 % (ref 36.0–46.0)
HEMOGLOBIN: 13.7 g/dL (ref 12.0–15.0)
IMMATURE GRANULOCYTES: 0 %
LYMPHS ABS: 1.3 10*3/uL (ref 0.7–4.0)
LYMPHS PCT: 20 %
MCH: 30.6 pg (ref 26.0–34.0)
MCHC: 32.7 g/dL (ref 30.0–36.0)
MCV: 93.7 fL (ref 80.0–100.0)
MONOS PCT: 6 %
Monocytes Absolute: 0.4 10*3/uL (ref 0.1–1.0)
NEUTROS PCT: 72 %
NRBC: 0 % (ref 0.0–0.2)
Neutro Abs: 4.9 10*3/uL (ref 1.7–7.7)
Platelets: 246 10*3/uL (ref 150–400)
RBC: 4.47 MIL/uL (ref 3.87–5.11)
RDW: 12.4 % (ref 11.5–15.5)
WBC: 6.8 10*3/uL (ref 4.0–10.5)

## 2018-06-10 LAB — COMPREHENSIVE METABOLIC PANEL
ALBUMIN: 3.8 g/dL (ref 3.5–5.0)
ALT: 17 U/L (ref 0–44)
AST: 19 U/L (ref 15–41)
Alkaline Phosphatase: 106 U/L (ref 38–126)
Anion gap: 7 (ref 5–15)
BILIRUBIN TOTAL: 0.3 mg/dL (ref 0.3–1.2)
BUN: 22 mg/dL (ref 8–23)
CALCIUM: 9.5 mg/dL (ref 8.9–10.3)
CO2: 26 mmol/L (ref 22–32)
CREATININE: 1.01 mg/dL — AB (ref 0.44–1.00)
Chloride: 106 mmol/L (ref 98–111)
GFR calc Af Amer: >60 mL/min (ref >60)
GFR, EST NON AFRICAN AMERICAN: 56 mL/min — AB (ref >60)
GLUCOSE: 95 mg/dL (ref 70–99)
Potassium: 2.8 mmol/L — ABNORMAL LOW (ref 3.5–5.1)
Sodium: 139 mmol/L (ref 135–145)
TOTAL PROTEIN: 7.2 g/dL (ref 6.5–8.1)

## 2018-06-17 ENCOUNTER — Inpatient Hospital Stay (HOSPITAL_BASED_OUTPATIENT_CLINIC_OR_DEPARTMENT_OTHER): Payer: Medicare Other | Admitting: Hematology

## 2018-06-17 ENCOUNTER — Encounter (HOSPITAL_COMMUNITY): Payer: Self-pay | Admitting: Hematology

## 2018-06-17 VITALS — BP 139/58 | HR 64 | Temp 97.4°F | Resp 16 | Wt 235.0 lb

## 2018-06-17 DIAGNOSIS — Z923 Personal history of irradiation: Secondary | ICD-10-CM | POA: Diagnosis not present

## 2018-06-17 DIAGNOSIS — C50411 Malignant neoplasm of upper-outer quadrant of right female breast: Secondary | ICD-10-CM | POA: Diagnosis not present

## 2018-06-17 DIAGNOSIS — Z79899 Other long term (current) drug therapy: Secondary | ICD-10-CM

## 2018-06-17 DIAGNOSIS — E876 Hypokalemia: Secondary | ICD-10-CM

## 2018-06-17 DIAGNOSIS — Z7982 Long term (current) use of aspirin: Secondary | ICD-10-CM | POA: Diagnosis not present

## 2018-06-17 DIAGNOSIS — I10 Essential (primary) hypertension: Secondary | ICD-10-CM

## 2018-06-17 DIAGNOSIS — Z170 Estrogen receptor positive status [ER+]: Secondary | ICD-10-CM | POA: Diagnosis not present

## 2018-06-17 MED ORDER — POTASSIUM CHLORIDE CRYS ER 20 MEQ PO TBCR: 20.0000 meq | EXTENDED_RELEASE_TABLET | Freq: Every day | ORAL | 1 refills | Status: DC

## 2018-06-17 MED ORDER — POTASSIUM CHLORIDE CRYS ER 20 MEQ PO TBCR: 20.0000 meq | EXTENDED_RELEASE_TABLET | Freq: Once | ORAL | 0 refills | Status: DC

## 2018-06-17 NOTE — Assessment & Plan Note (Addendum)
1.  Right breast infiltrating ductal carcinoma: -Lumpectomy on 06/11/2017, 1.4 cm, 0 out of 2 sentinel lymph nodes positive, margins negative, ER/PR positive, HER-2 negative, Ki-67 of 5% -Oncotype DX recurrence score of 10 -Negative genetic testing for hereditary breast cancer -Radiation therapy finished in January 2019 -Anastrozole was started on 09/06/2017, tolerating it very well.  Has occasional hot flashes.  Physical examination today shows some lymphedema in the lower quadrant of the right breast.  No palpable masses.  No palpable adenopathy. -I reviewed the results of the mammogram dated 05/12/2018, BI-RADS Category 2. -I have reviewed her blood work.  No evidence of recurrence at this time. -She will come back in 4 months for follow-up.  2.  Mild hypercalcemia: -Previously had mild hypercalcemia. -Today calcium level is within normal limits.  Will check PTH previously which was within normal limits. -She will continue vitamin D daily.   3.  Bone density:  -She reportedly had bone density test last year at Dr. Trena Platt office.  This was reportedly normal.  We will try to obtain a copy of it.  4.  Hypokalemia: - Her potassium is 2.8.  She is on hydrochlorothiazide. -She also complained of some leg cramps in the left leg. -We will start her on potassium 20 mEq daily.  We have sent prescription to her pharmacy.

## 2018-06-17 NOTE — Patient Instructions (Signed)
[facility] at [facility] Discharge Instructions     Thank you for choosing [facility] to provide your oncology and hematology care.  To afford each patient quality time with our provider, please arrive at least 15 minutes before your scheduled appointment time.   If you have a lab appointment with the Cancer Center please come in thru the  Main Entrance and check in at the main information desk  You need to re-schedule your appointment should you arrive 10 or more minutes late.  We strive to give you quality time with our providers, and arriving late affects you and other patients whose appointments are after yours.  Also, if you no show three or more times for appointments you may be dismissed from the clinic at the providers discretion.     Again, thank you for choosing [facility].  Our hope is that these requests will decrease the amount of time that you wait before being seen by our physicians.       _____________________________________________________________  Should you have questions after your visit to [facility], please contact our office at [phone] between the hours of 8:00 a.m. and 4:30 p.m.  Voicemails left after 4:00 p.m. will not be returned until the following business day.  For prescription refill requests, have your pharmacy contact our office and allow 72 hours.    Cancer Center Support Programs:   > Cancer Support Group  2nd Tuesday of the month 1pm-2pm, Journey Room    

## 2018-06-17 NOTE — Progress Notes (Signed)
[name], [place] [address]   CLINIC:  Medical Oncology/Hematology  PCP:  Monico Blitz, [place] [address] [phone]   REASON FOR VISIT: Follow-up for right breast cancer, ER+/PR+/HER2-  CURRENT THERAPY: Arimidex   BRIEF ONCOLOGIC HISTORY:    Carcinoma of upper-outer quadrant of right breast in female, estrogen receptor positive ([place])   06/11/2017 Initial Diagnosis    Carcinoma of upper-outer quadrant of right breast in female, estrogen receptor positive ([place])    07/08/2017 Genetic Testing    Negative genetic testing on the Multi-cancer panel.  The Multi-Gene Panel offered by [facility] includes sequencing and/or deletion duplication testing of the following 83 genes: ALK, APC, ATM, AXIN2,BAP1,  BARD1, BLM, BMPR1A, BRCA1, BRCA2, BRIP1, CASR, CDC73, CDH1, CDK4, CDKN1B, CDKN1C, CDKN2A (p14ARF), CDKN2A (p16INK4a), CEBPA, CHEK2, CTNNA1, DICER1, DIS3L2, EGFR (c.2369C>T, p.Thr790Met variant only), EPCAM (Deletion/duplication testing only), FH, FLCN, GATA2, GPC3, GREM1 (Promoter region deletion/duplication testing only), HOXB13 (c.251G>A, p.Gly84Glu), HRAS, KIT, MAX, MEN1, MET, MITF (c.952G>A, p.Glu318Lys variant only), MLH1, MSH2, MSH3, MSH6, MUTYH, NBN, NF1, NF2, NTHL1, PALB2, PDGFRA, PHOX2B, PMS2, POLD1, POLE, POT1, PRKAR1A, PTCH1, PTEN, RAD50, RAD51C, RAD51D, RB1, RECQL4, RET, RUNX1, SDHAF2, SDHA (sequence changes only), SDHB, SDHC, SDHD, SMAD4, SMARCA4, SMARCB1, SMARCE1, STK11, SUFU, TERT, TERT, TMEM127, TP53, TSC1, TSC2, VHL, WRN and WT1.  The report date is July 08, 2017.       CANCER STAGING: Cancer Staging Carcinoma of upper-outer quadrant of right breast in female, estrogen receptor positive ([place]) Staging form: Breast, AJCC 8th Edition - Clinical: cT1c, cN0 - Unsigned - Pathologic: Stage IA (pT1c, pN0, cM0, G1, ER: Positive, PR: Positive, HER2: Negative, Oncotype DX score: 11) - Signed by Twana First, MD on  06/13/2017    INTERVAL HISTORY:  [name] 72 y.o. female returns for routine follow-up for right breast cancer. She tolerating Arimidex well. She does have occasional hot flashes. They are mostly at night time she they don't bother her as bad. She denies any nausea, vomiting, or diarrhea. Denies any new pains or lumps felt. Denies any easy bruising. Denies any skin rashes. She reports her appetite at 100% and her energy level at 75%. She lives at home and tries to remain active.    REVIEW OF SYSTEMS:  Review of Systems  Endocrine: Positive for hot flashes.  All other systems reviewed and are negative.    PAST MEDICAL/SURGICAL HISTORY:  Past Medical History:  Diagnosis Date  . Anxiety   . Cancer ([place]) 05/2017   right breast cancer  . Family history of breast cancer   . Family history of ovarian cancer   . Family history of stomach cancer   . GERD (gastroesophageal reflux disease)   . High cholesterol   . History of radiation therapy 07/03/17- 07/31/2017   Right Breast/ 40/05 Gy in 15 fractions, Right Breast boost/ 10 Gy in 5 fractions.   . Hypertension   . Personal history of radiation therapy 2018/2019   Past Surgical History:  Procedure Laterality Date  . BREAST BIOPSY Right 05/09/2017   x2  . BREAST LUMPECTOMY Right 05/20/2017  . CARDIAC CATHETERIZATION     2008- negative findings  . CATARACT EXTRACTION W/PHACO Right 09/06/2014   Procedure: CATARACT EXTRACTION PHACO AND INTRAOCULAR LENS PLACEMENT; CDE:  3.85;  Surgeon: Williams Che, MD;  Location: [facility];  Service: Ophthalmology;  Laterality: Right;  . CATARACT EXTRACTION W/PHACO Left 12/13/2014   Procedure: CATARACT EXTRACTION PHACO AND INTRAOCULAR LENS PLACEMENT LEFT EYE  CDE=10.79;  Surgeon: Williams Che, MD;  Location: [facility];  Service: Ophthalmology;  Laterality: Left;  . ESOPHAGOGASTRODUODENOSCOPY N/A 05/05/2014   Procedure: ESOPHAGOGASTRODUODENOSCOPY (EGD);  Surgeon: Rogene Houston, MD;  Location: [facility];  Service: Endoscopy;  Laterality: N/A;  255  . EUS N/A 06/10/2014   Procedure: UPPER ENDOSCOPIC ULTRASOUND (EUS) LINEAR;  Surgeon: Milus Banister, MD;  Location: [facility];  Service: Endoscopy;  Laterality: N/A;  . RADIOACTIVE SEED GUIDED PARTIAL MASTECTOMY WITH AXILLARY SENTINEL LYMPH NODE BIOPSY Right 05/20/2017   Procedure: RIGHT BREAST LUMPECTOMY WITH RADIOACTIVE SEED AND SENTINEL LYMPH NODE BIOPSY;  Surgeon: Rolm Bookbinder, MD;  Location: [facility];  Service: General;  Laterality: Right;  . TUBAL LIGATION       SOCIAL HISTORY:  Social History   Socioeconomic History  . Marital status: Married    Spouse name: Not on file  . Number of children: 2  . Years of education: Not on file  . Highest education level: Not on file  Occupational History  . Occupation: retired  Scientific laboratory technician  . Financial resource strain: Not on file  . Food insecurity:    Worry: Not on file    Inability: Not on file  . Transportation needs:    Medical: Not on file    Non-medical: Not on file  Tobacco Use  . Smoking status: Never Smoker  . Smokeless tobacco: Never Used  Substance and Sexual Activity  . Alcohol use: No  . Drug use: No  . Sexual activity: Yes    Birth control/protection: Post-menopausal  Lifestyle  . Physical activity:    Days per week: Not on file    Minutes per session: Not on file  . Stress: Not on file  Relationships  . Social connections:    Talks on phone: Not on file    Gets together: Not on file    Attends religious service: Not on file    Active member of club or organization: Not on file    Attends meetings of clubs or organizations: Not on file    Relationship status: Not on file  . Intimate partner violence:    Fear of current or ex partner: Not on file    Emotionally abused: Not on file    Physically abused: Not on file    Forced sexual activity: Not on file  Other Topics Concern  . Not on file  Social History Narrative  . Not on  file    FAMILY HISTORY:  Family History  Problem Relation Age of Onset  . CAD Mother 27  . Leukemia Father   . Parkinson's disease Father   . Breast cancer Daughter 54       BRCA neg  . Heart Problems Maternal Aunt   . Cancer Maternal Uncle        NOS  . Cancer Paternal Uncle        NOS  . Diabetes Maternal Grandfather   . Liver cancer Maternal Grandfather   . Cancer Paternal Grandmother        NOS  . Brain cancer Maternal Aunt   . Lung cancer Maternal Aunt        smoker  . Cancer Maternal Uncle        NOS  . Stomach cancer Maternal Uncle   . Lung cancer Maternal Uncle        smoker  . Breast cancer Cousin        mat first cousin  .  Breast cancer Cousin        mat first cousin dx under 33  . Breast cancer Cousin        2 additional mat first cousins  . Ovarian cancer Cousin        mat first cousin  . Cancer Cousin        several female mat first cousins with cancer NOS  . Breast cancer Cousin        2 pat first cousins  . Cancer Cousin        3 pat first cousins with cancer NOS    CURRENT MEDICATIONS:  Outpatient Encounter Medications as of 06/17/2018  Medication Sig Note  . amLODipine (NORVASC) 10 MG tablet Take 10 mg by mouth daily.   Marland Kitchen anastrozole (ARIMIDEX) 1 MG tablet Take 1 tablet (1 mg total) by mouth daily.   Marland Kitchen aspirin EC 81 MG tablet Take 81 mg by mouth daily.   . cholecalciferol (VITAMIN D) 1000 units tablet Take 2,000 Units by mouth daily.    Marland Kitchen co-enzyme Q-10 30 MG capsule Take 100 mg by mouth daily.   . diclofenac (VOLTAREN) 75 MG EC tablet    . losartan-hydrochlorothiazide (HYZAAR) 100-25 MG per tablet Take 1 tablet by mouth every morning.  06/25/2016: Patient was advised to take an additional tablet due to Blood pressure levels  . metoprolol succinate (TOPROL-XL) 25 MG 24 hr tablet Take 25 mg by mouth every morning.    . Omega-3 Fatty Acids (OMEGA 3 500) 500 MG CAPS Take by mouth.   Marland Kitchen omeprazole (PRILOSEC) 20 MG capsule Take 20 mg by mouth daily.    . [DISCONTINUED] anastrozole (ARIMIDEX) 1 MG tablet Take 1 tablet (1 mg total) by mouth daily.    No facility-administered encounter medications on file as of 06/17/2018.     ALLERGIES:  Allergies  Allergen Reactions  . Nitrostat [Nitroglycerin] Shortness Of Breath     PHYSICAL EXAM:  ECOG Performance status: 1  Vitals:   06/17/18 0759  BP: (!) 139/58  Pulse: 64  Resp: 16  Temp: (!) 97.4 F (36.3 C)  SpO2: 100%   Filed Weights   06/17/18 0759  Weight: 235 lb (106.6 kg)    Physical Exam  Constitutional: She is oriented to person, place, and time. She appears well-developed and well-nourished.  Abdominal: Soft.  Musculoskeletal: Normal range of motion.  Neurological: She is alert and oriented to person, place, and time.  Skin: Skin is warm and dry.  Psychiatric: She has a normal mood and affect. Her behavior is normal. Judgment and thought content normal.  Breast: Mild lymph edema in the right breast. No palpable masses, no skin changes or nipple discharge, no adenopathy.    LABORATORY DATA:  I have reviewed the labs as listed.  CBC    Component Value Date/Time   WBC 6.8 06/10/2018 1027   RBC 4.47 06/10/2018 1027   HGB 13.7 06/10/2018 1027   HCT 41.9 06/10/2018 1027   PLT 246 06/10/2018 1027   MCV 93.7 06/10/2018 1027   MCH 30.6 06/10/2018 1027   MCHC 32.7 06/10/2018 1027   RDW 12.4 06/10/2018 1027   LYMPHSABS 1.3 06/10/2018 1027   MONOABS 0.4 06/10/2018 1027   EOSABS 0.1 06/10/2018 1027   BASOSABS 0.0 06/10/2018 1027   CMP Latest Ref Rng & Units 06/10/2018 02/07/2018 02/07/2018  Glucose 70 - 99 mg/dL 95 97 -  BUN 8 - 23 mg/dL 22 22 -  Creatinine 0.44 - 1.00 mg/dL 1.01(H) 1.04(H) -  Sodium 135 - 145 mmol/L 139 137 -  Potassium 3.5 - 5.1 mmol/L 2.8(L) 3.7 -  Chloride 98 - 111 mmol/L 106 103 -  CO2 22 - 32 mmol/L 26 27 -  Calcium 8.9 - 10.3 mg/dL 9.5 10.2 10.4(H)  Total Protein 6.5 - 8.1 g/dL 7.2 7.1 -  Total Bilirubin 0.3 - 1.2 mg/dL 0.3 0.8 -  Alkaline  Phos 38 - 126 U/L 106 99 -  AST 15 - 41 U/L 19 19 -  ALT 0 - 44 U/L 17 16 -       DIAGNOSTIC IMAGING:  I have independently reviewed the scans and discussed with the patient.  I have reviewed Francene Finders, NP's note and agree with the documentation.  I personally performed a face-to-face visit, made revisions and my assessment and plan is as follows.     ASSESSMENT & PLAN:   Carcinoma of upper-outer quadrant of right breast in female, estrogen receptor positive ([place]) 1.  Right breast infiltrating ductal carcinoma: -Lumpectomy on 06/11/2017, 1.4 cm, 0 out of 2 sentinel lymph nodes positive, margins negative, ER/PR positive, HER-2 negative, Ki-67 of 5% -Oncotype DX recurrence score of 10 -Negative genetic testing for hereditary breast cancer -Radiation therapy finished in January 2019 -Anastrozole was started on 09/06/2017, tolerating it very well.  Has occasional hot flashes. -I reviewed the results of the mammogram dated 05/12/2018, BI-RADS Category 2. -I have reviewed her blood work.  No evidence of recurrence at this time. -She will come back in 4 months for follow-up.  2.  Mild hypercalcemia: -Previously had mild hypercalcemia. -Today calcium level is within normal limits.  Will check PTH previously which was within normal limits. -She will continue vitamin D daily.   3.  Bone density: She reportedly had bone density test last year at Dr. Trena Platt office.  This was reportedly normal.  We will try to obtain a copy of it.        Orders placed this encounter:  No orders of the defined types were placed in this encounter.     Derek Jack, MD [facility] [phone]

## 2018-07-08 DIAGNOSIS — Z1339 Encounter for screening examination for other mental health and behavioral disorders: Secondary | ICD-10-CM | POA: Diagnosis not present

## 2018-07-08 DIAGNOSIS — Z0000 Encounter for general adult medical examination without abnormal findings: Secondary | ICD-10-CM | POA: Diagnosis not present

## 2018-07-08 DIAGNOSIS — Z299 Encounter for prophylactic measures, unspecified: Secondary | ICD-10-CM | POA: Diagnosis not present

## 2018-07-08 DIAGNOSIS — Z1211 Encounter for screening for malignant neoplasm of colon: Secondary | ICD-10-CM | POA: Diagnosis not present

## 2018-07-08 DIAGNOSIS — E559 Vitamin D deficiency, unspecified: Secondary | ICD-10-CM | POA: Diagnosis not present

## 2018-07-08 DIAGNOSIS — E7800 Pure hypercholesterolemia, unspecified: Secondary | ICD-10-CM | POA: Diagnosis not present

## 2018-07-08 DIAGNOSIS — Z79899 Other long term (current) drug therapy: Secondary | ICD-10-CM | POA: Diagnosis not present

## 2018-07-08 DIAGNOSIS — R5383 Other fatigue: Secondary | ICD-10-CM | POA: Diagnosis not present

## 2018-07-08 DIAGNOSIS — Z6837 Body mass index (BMI) 37.0-37.9, adult: Secondary | ICD-10-CM | POA: Diagnosis not present

## 2018-07-08 DIAGNOSIS — Z1331 Encounter for screening for depression: Secondary | ICD-10-CM | POA: Diagnosis not present

## 2018-07-08 DIAGNOSIS — Z7189 Other specified counseling: Secondary | ICD-10-CM | POA: Diagnosis not present

## 2018-07-21 DIAGNOSIS — E7800 Pure hypercholesterolemia, unspecified: Secondary | ICD-10-CM | POA: Diagnosis not present

## 2018-07-21 DIAGNOSIS — I10 Essential (primary) hypertension: Secondary | ICD-10-CM | POA: Diagnosis not present

## 2018-07-22 DIAGNOSIS — Z6837 Body mass index (BMI) 37.0-37.9, adult: Secondary | ICD-10-CM | POA: Diagnosis not present

## 2018-07-22 DIAGNOSIS — Z789 Other specified health status: Secondary | ICD-10-CM | POA: Diagnosis not present

## 2018-07-22 DIAGNOSIS — Z299 Encounter for prophylactic measures, unspecified: Secondary | ICD-10-CM | POA: Diagnosis not present

## 2018-07-22 DIAGNOSIS — K219 Gastro-esophageal reflux disease without esophagitis: Secondary | ICD-10-CM | POA: Diagnosis not present

## 2018-07-22 DIAGNOSIS — N183 Chronic kidney disease, stage 3 (moderate): Secondary | ICD-10-CM | POA: Diagnosis not present

## 2018-07-22 DIAGNOSIS — I10 Essential (primary) hypertension: Secondary | ICD-10-CM | POA: Diagnosis not present

## 2018-07-22 DIAGNOSIS — M549 Dorsalgia, unspecified: Secondary | ICD-10-CM | POA: Diagnosis not present

## 2018-07-31 DIAGNOSIS — Z6838 Body mass index (BMI) 38.0-38.9, adult: Secondary | ICD-10-CM | POA: Diagnosis not present

## 2018-07-31 DIAGNOSIS — Z299 Encounter for prophylactic measures, unspecified: Secondary | ICD-10-CM | POA: Diagnosis not present

## 2018-07-31 DIAGNOSIS — Z789 Other specified health status: Secondary | ICD-10-CM | POA: Diagnosis not present

## 2018-07-31 DIAGNOSIS — R350 Frequency of micturition: Secondary | ICD-10-CM | POA: Diagnosis not present

## 2018-07-31 DIAGNOSIS — N390 Urinary tract infection, site not specified: Secondary | ICD-10-CM | POA: Diagnosis not present

## 2018-07-31 DIAGNOSIS — N183 Chronic kidney disease, stage 3 (moderate): Secondary | ICD-10-CM | POA: Diagnosis not present

## 2018-07-31 DIAGNOSIS — I10 Essential (primary) hypertension: Secondary | ICD-10-CM | POA: Diagnosis not present

## 2018-08-07 DIAGNOSIS — E2839 Other primary ovarian failure: Secondary | ICD-10-CM | POA: Diagnosis not present

## 2018-09-19 DIAGNOSIS — Z789 Other specified health status: Secondary | ICD-10-CM | POA: Diagnosis not present

## 2018-09-19 DIAGNOSIS — Z6838 Body mass index (BMI) 38.0-38.9, adult: Secondary | ICD-10-CM | POA: Diagnosis not present

## 2018-09-19 DIAGNOSIS — J019 Acute sinusitis, unspecified: Secondary | ICD-10-CM | POA: Diagnosis not present

## 2018-09-19 DIAGNOSIS — I10 Essential (primary) hypertension: Secondary | ICD-10-CM | POA: Diagnosis not present

## 2018-09-19 DIAGNOSIS — Z299 Encounter for prophylactic measures, unspecified: Secondary | ICD-10-CM | POA: Diagnosis not present

## 2018-10-02 IMAGING — MG MM PLC BREAST LOC DEV 1ST LESION INC*R*
5 series · 5 of 5 positions shown · non-contrast
Comparison: Previous exam(s).

CLINICAL DATA: Patient for preoperative localization prior to right
lumpectomy. The X shaped marking clip was localized at the site of
recently diagnosed right breast ductal carcinoma.

EXAM:
MAMMOGRAPHIC GUIDED RADIOACTIVE SEED LOCALIZATION OF THE RIGHT
BREAST

[R CC (1 of 3)]
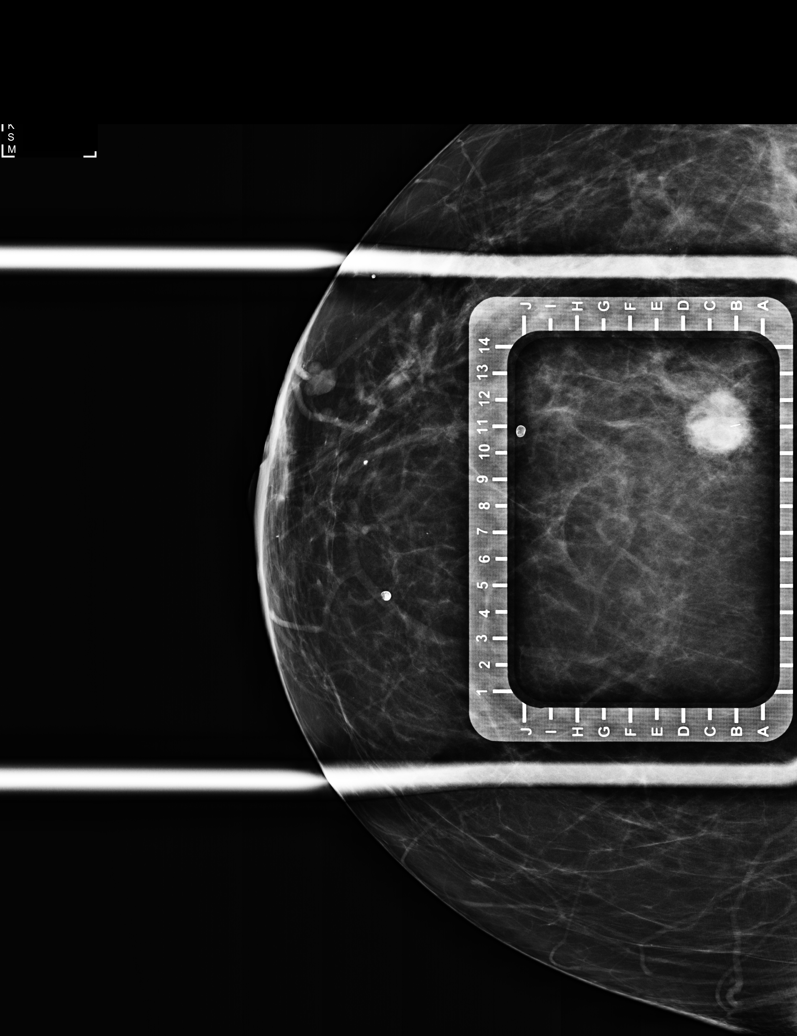

[R CC (2 of 3)]
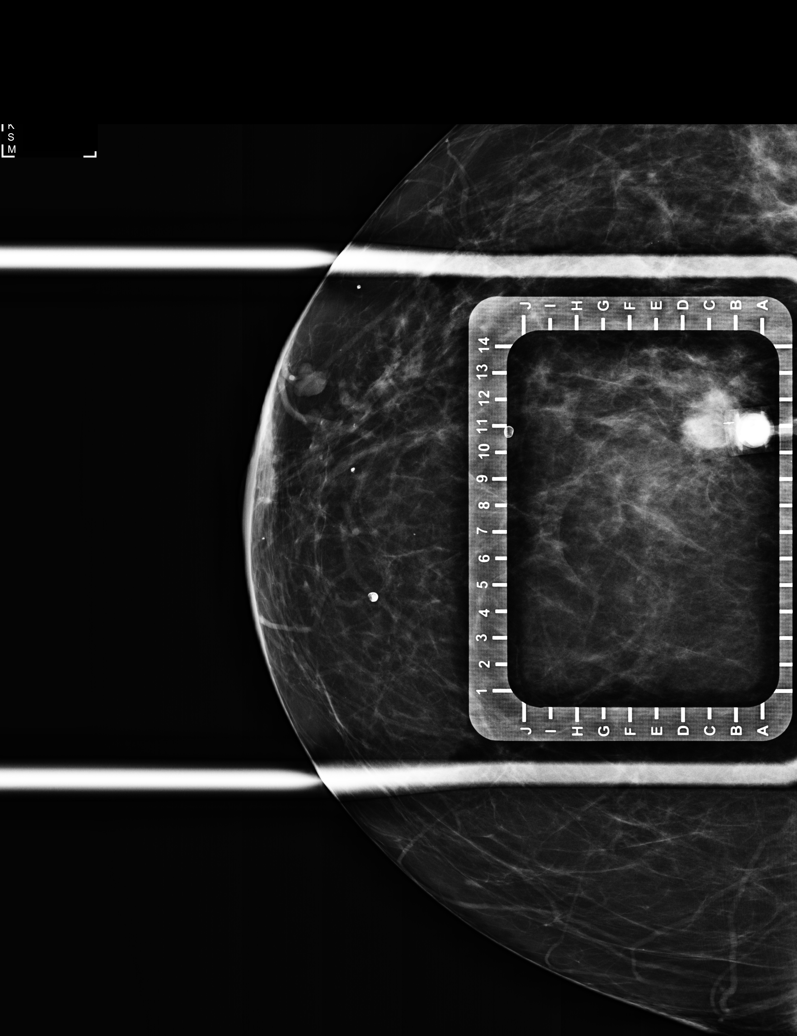

[R ML (1 of 2)]
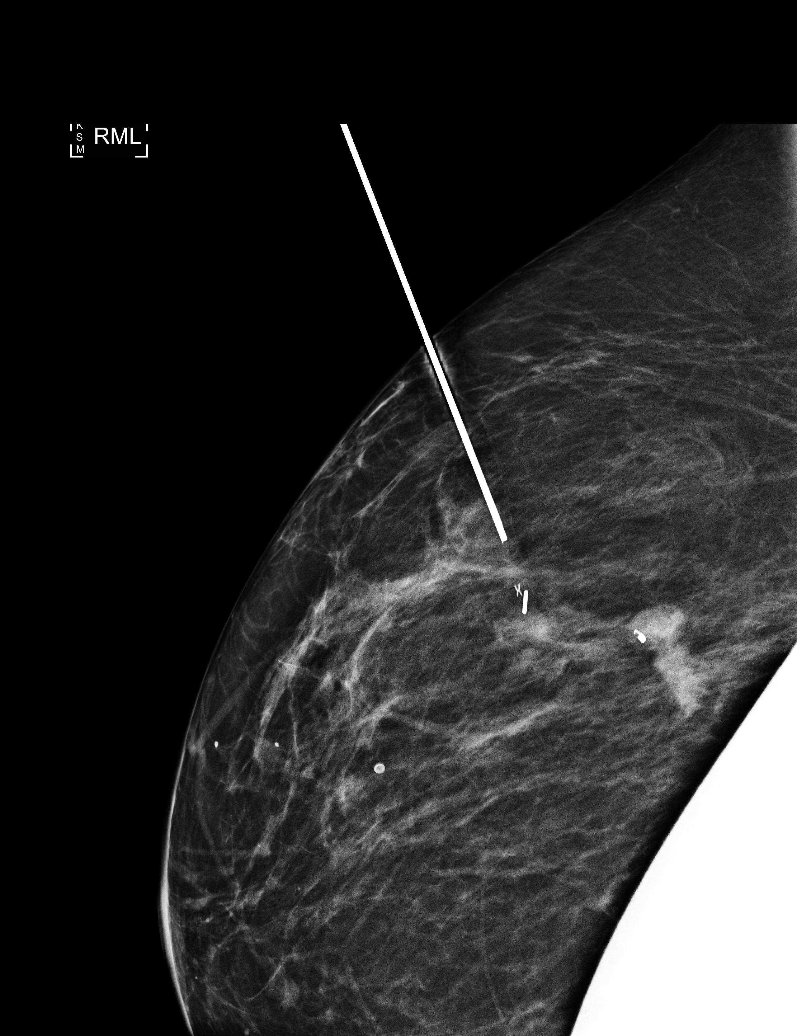

[R CC (3 of 3)]
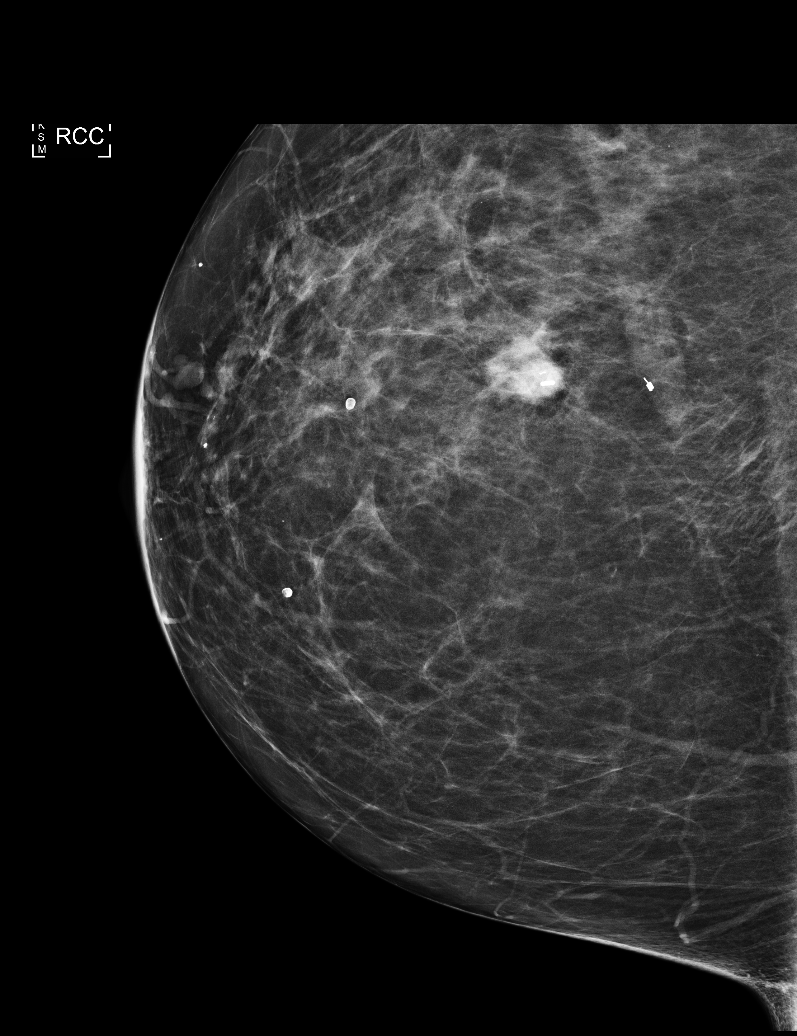

[R ML (2 of 2)]
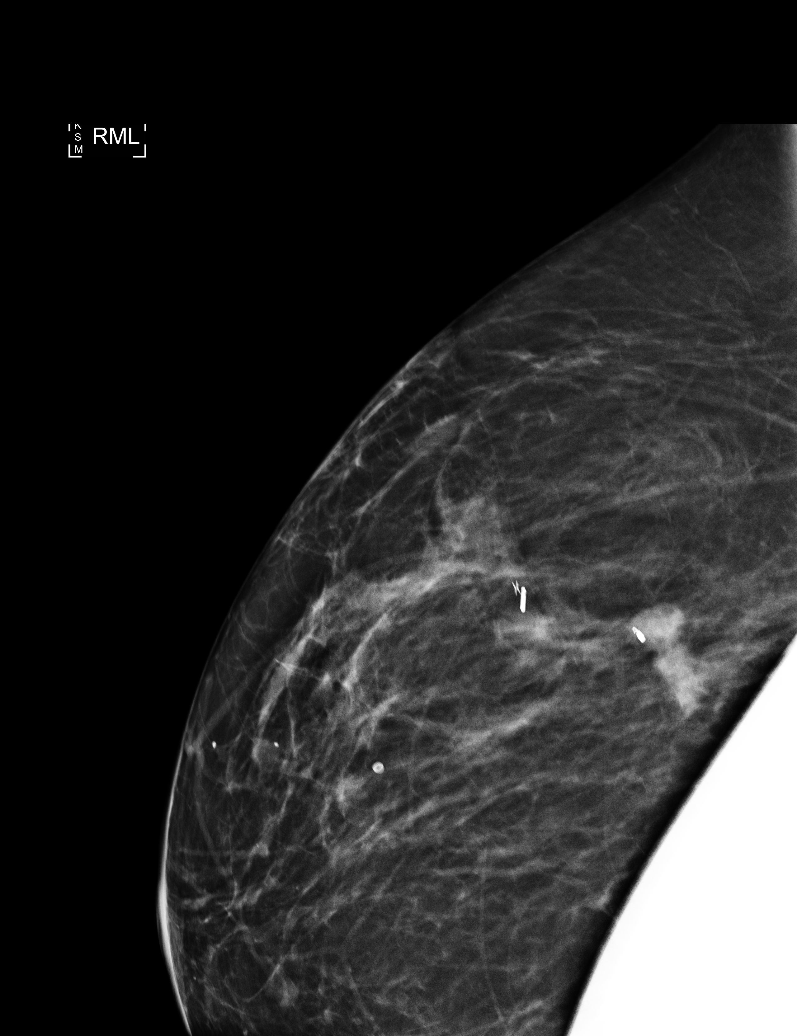

[5 of 5 positions shown; findings below may reference images not displayed]

FINDINGS: Patient presents for radioactive seed localization prior to right
breast lumpectomy. I met with the patient and we discussed the
procedure of seed localization including benefits and alternatives.
We discussed the high likelihood of a successful procedure. We
discussed the risks of the procedure including infection, bleeding,
tissue injury and further surgery. We discussed the low dose of
radioactivity involved in the procedure. Informed, written consent
was given.

The usual time-out protocol was performed immediately prior to the
procedure.

Using mammographic guidance, sterile technique, 1% lidocaine and an
[id number] radioactive seed, X shaped marking clip was localized using a
cranial approach. The follow-up mammogram images confirm the seed in
the expected location and were marked for Dr. Moriyama.

Follow-up survey of the patient confirms presence of the radioactive
seed.

Order number of [id number] seed:  [id number].

Total activity:  0.257 millicuries  Reference Date: 04/25/2017

The patient tolerated the procedure well and was released from the
[REDACTED]. She was given instructions regarding seed removal.
IMPRESSION: Radioactive seed localization right breast. No apparent
complications.

## 2018-10-15 ENCOUNTER — Other Ambulatory Visit (HOSPITAL_COMMUNITY): Payer: Medicare Other

## 2018-10-22 ENCOUNTER — Ambulatory Visit (HOSPITAL_COMMUNITY): Payer: Medicare Other | Admitting: Hematology

## 2018-11-06 DIAGNOSIS — I10 Essential (primary) hypertension: Secondary | ICD-10-CM | POA: Diagnosis not present

## 2018-11-06 DIAGNOSIS — E7800 Pure hypercholesterolemia, unspecified: Secondary | ICD-10-CM | POA: Diagnosis not present

## 2018-11-26 ENCOUNTER — Other Ambulatory Visit (HOSPITAL_COMMUNITY): Payer: Self-pay | Admitting: Nurse Practitioner

## 2018-11-26 DIAGNOSIS — E876 Hypokalemia: Secondary | ICD-10-CM

## 2018-12-08 DIAGNOSIS — Z299 Encounter for prophylactic measures, unspecified: Secondary | ICD-10-CM | POA: Diagnosis not present

## 2018-12-08 DIAGNOSIS — Z713 Dietary counseling and surveillance: Secondary | ICD-10-CM | POA: Diagnosis not present

## 2018-12-08 DIAGNOSIS — I10 Essential (primary) hypertension: Secondary | ICD-10-CM | POA: Diagnosis not present

## 2018-12-08 DIAGNOSIS — Z6838 Body mass index (BMI) 38.0-38.9, adult: Secondary | ICD-10-CM | POA: Diagnosis not present

## 2018-12-08 DIAGNOSIS — N183 Chronic kidney disease, stage 3 (moderate): Secondary | ICD-10-CM | POA: Diagnosis not present

## 2018-12-09 DIAGNOSIS — I10 Essential (primary) hypertension: Secondary | ICD-10-CM | POA: Diagnosis not present

## 2018-12-09 DIAGNOSIS — E7800 Pure hypercholesterolemia, unspecified: Secondary | ICD-10-CM | POA: Diagnosis not present

## 2018-12-15 ENCOUNTER — Other Ambulatory Visit (HOSPITAL_COMMUNITY): Payer: Medicare Other

## 2018-12-18 ENCOUNTER — Inpatient Hospital Stay (HOSPITAL_COMMUNITY): Payer: Medicare Other | Attending: Hematology

## 2018-12-18 ENCOUNTER — Other Ambulatory Visit: Payer: Self-pay

## 2018-12-18 DIAGNOSIS — F419 Anxiety disorder, unspecified: Secondary | ICD-10-CM | POA: Diagnosis not present

## 2018-12-18 DIAGNOSIS — M8580 Other specified disorders of bone density and structure, unspecified site: Secondary | ICD-10-CM | POA: Diagnosis not present

## 2018-12-18 DIAGNOSIS — Z791 Long term (current) use of non-steroidal anti-inflammatories (NSAID): Secondary | ICD-10-CM | POA: Insufficient documentation

## 2018-12-18 DIAGNOSIS — Z79811 Long term (current) use of aromatase inhibitors: Secondary | ICD-10-CM | POA: Insufficient documentation

## 2018-12-18 DIAGNOSIS — Z79899 Other long term (current) drug therapy: Secondary | ICD-10-CM | POA: Insufficient documentation

## 2018-12-18 DIAGNOSIS — Z7982 Long term (current) use of aspirin: Secondary | ICD-10-CM | POA: Diagnosis not present

## 2018-12-18 DIAGNOSIS — I890 Lymphedema, not elsewhere classified: Secondary | ICD-10-CM | POA: Diagnosis not present

## 2018-12-18 DIAGNOSIS — E876 Hypokalemia: Secondary | ICD-10-CM | POA: Diagnosis not present

## 2018-12-18 DIAGNOSIS — R232 Flushing: Secondary | ICD-10-CM | POA: Diagnosis not present

## 2018-12-18 DIAGNOSIS — C50411 Malignant neoplasm of upper-outer quadrant of right female breast: Secondary | ICD-10-CM | POA: Insufficient documentation

## 2018-12-18 DIAGNOSIS — K219 Gastro-esophageal reflux disease without esophagitis: Secondary | ICD-10-CM | POA: Diagnosis not present

## 2018-12-18 DIAGNOSIS — Z923 Personal history of irradiation: Secondary | ICD-10-CM | POA: Diagnosis not present

## 2018-12-18 DIAGNOSIS — Z170 Estrogen receptor positive status [ER+]: Secondary | ICD-10-CM | POA: Insufficient documentation

## 2018-12-18 DIAGNOSIS — I10 Essential (primary) hypertension: Secondary | ICD-10-CM | POA: Insufficient documentation

## 2018-12-18 LAB — COMPREHENSIVE METABOLIC PANEL
ALT: 16 U/L (ref 0–44)
AST: 16 U/L (ref 15–41)
Albumin: 3.8 g/dL (ref 3.5–5.0)
Alkaline Phosphatase: 112 U/L (ref 38–126)
Anion gap: 11 (ref 5–15)
BUN: 22 mg/dL (ref 8–23)
CO2: 25 mmol/L (ref 22–32)
Calcium: 10 mg/dL (ref 8.9–10.3)
Chloride: 104 mmol/L (ref 98–111)
Creatinine, Ser: 0.97 mg/dL (ref 0.44–1.00)
GFR calc Af Amer: >60 mL/min (ref >60)
GFR calc non Af Amer: 58 mL/min — ABNORMAL LOW (ref >60)
Glucose, Bld: 118 mg/dL — ABNORMAL HIGH (ref 70–99)
Potassium: 3.7 mmol/L (ref 3.5–5.1)
Sodium: 140 mmol/L (ref 135–145)
Total Bilirubin: 0.6 mg/dL (ref 0.3–1.2)
Total Protein: 6.9 g/dL (ref 6.5–8.1)

## 2018-12-18 LAB — CBC WITH DIFFERENTIAL/PLATELET
Abs Immature Granulocytes: 0.02 10*3/uL (ref 0.00–0.07)
Basophils Absolute: 0 10*3/uL (ref 0.0–0.1)
Basophils Relative: 0 %
Eosinophils Absolute: 0.2 10*3/uL (ref 0.0–0.5)
Eosinophils Relative: 2 %
HCT: 41.9 % (ref 36.0–46.0)
Hemoglobin: 13.7 g/dL (ref 12.0–15.0)
Immature Granulocytes: 0 %
Lymphocytes Relative: 21 %
Lymphs Abs: 1.5 10*3/uL (ref 0.7–4.0)
MCH: 30.4 pg (ref 26.0–34.0)
MCHC: 32.7 g/dL (ref 30.0–36.0)
MCV: 92.9 fL (ref 80.0–100.0)
Monocytes Absolute: 0.5 10*3/uL (ref 0.1–1.0)
Monocytes Relative: 7 %
Neutro Abs: 4.9 10*3/uL (ref 1.7–7.7)
Neutrophils Relative %: 70 %
Platelets: 260 10*3/uL (ref 150–400)
RBC: 4.51 MIL/uL (ref 3.87–5.11)
RDW: 12.2 % (ref 11.5–15.5)
WBC: 7.1 10*3/uL (ref 4.0–10.5)
nRBC: 0 % (ref 0.0–0.2)

## 2018-12-18 LAB — MAGNESIUM: Magnesium: 2 mg/dL (ref 1.7–2.4)

## 2018-12-19 ENCOUNTER — Other Ambulatory Visit: Payer: Self-pay

## 2018-12-19 LAB — CANCER ANTIGEN 15-3: CA 15-3: 6 U/mL (ref 0.0–25.0)

## 2018-12-22 ENCOUNTER — Encounter (HOSPITAL_COMMUNITY): Payer: Self-pay | Admitting: Hematology

## 2018-12-22 ENCOUNTER — Other Ambulatory Visit: Payer: Self-pay

## 2018-12-22 ENCOUNTER — Inpatient Hospital Stay (HOSPITAL_BASED_OUTPATIENT_CLINIC_OR_DEPARTMENT_OTHER): Payer: Medicare Other | Admitting: Hematology

## 2018-12-22 ENCOUNTER — Other Ambulatory Visit (HOSPITAL_COMMUNITY): Payer: Self-pay | Admitting: *Deleted

## 2018-12-22 VITALS — BP 145/58 | HR 67 | Temp 98.4°F | Resp 18 | Wt 234.0 lb

## 2018-12-22 DIAGNOSIS — F419 Anxiety disorder, unspecified: Secondary | ICD-10-CM | POA: Diagnosis not present

## 2018-12-22 DIAGNOSIS — C50411 Malignant neoplasm of upper-outer quadrant of right female breast: Secondary | ICD-10-CM

## 2018-12-22 DIAGNOSIS — Z7982 Long term (current) use of aspirin: Secondary | ICD-10-CM

## 2018-12-22 DIAGNOSIS — E876 Hypokalemia: Secondary | ICD-10-CM | POA: Diagnosis not present

## 2018-12-22 DIAGNOSIS — I890 Lymphedema, not elsewhere classified: Secondary | ICD-10-CM

## 2018-12-22 DIAGNOSIS — Z791 Long term (current) use of non-steroidal anti-inflammatories (NSAID): Secondary | ICD-10-CM

## 2018-12-22 DIAGNOSIS — I10 Essential (primary) hypertension: Secondary | ICD-10-CM

## 2018-12-22 DIAGNOSIS — Z923 Personal history of irradiation: Secondary | ICD-10-CM | POA: Diagnosis not present

## 2018-12-22 DIAGNOSIS — K219 Gastro-esophageal reflux disease without esophagitis: Secondary | ICD-10-CM

## 2018-12-22 DIAGNOSIS — Z79899 Other long term (current) drug therapy: Secondary | ICD-10-CM

## 2018-12-22 DIAGNOSIS — Z79811 Long term (current) use of aromatase inhibitors: Secondary | ICD-10-CM | POA: Diagnosis not present

## 2018-12-22 DIAGNOSIS — M8580 Other specified disorders of bone density and structure, unspecified site: Secondary | ICD-10-CM

## 2018-12-22 DIAGNOSIS — Z170 Estrogen receptor positive status [ER+]: Secondary | ICD-10-CM | POA: Diagnosis not present

## 2018-12-22 DIAGNOSIS — R232 Flushing: Secondary | ICD-10-CM | POA: Diagnosis not present

## 2018-12-22 MED ORDER — ANASTROZOLE 1 MG PO TABS: 1.0000 mg | ORAL_TABLET | Freq: Every day | ORAL | 3 refills | Status: DC

## 2018-12-22 NOTE — Progress Notes (Signed)
[place], [place] [address]   CLINIC:  Medical Oncology/Hematology  PCP:  Monico Blitz, [place] [address] [phone]   REASON FOR VISIT: Follow-up for right breast cancer, ER+/PR+/HER2-  CURRENT THERAPY: Arimidex   BRIEF ONCOLOGIC HISTORY:  Oncology History  Carcinoma of upper-outer quadrant of right breast in female, estrogen receptor positive ([place])  06/11/2017 Initial Diagnosis   Carcinoma of upper-outer quadrant of right breast in female, estrogen receptor positive ([place])   07/08/2017 Genetic Testing   Negative genetic testing on the Multi-cancer panel.  The Multi-Gene Panel offered by [facility] includes sequencing and/or deletion duplication testing of the following 83 genes: ALK, APC, ATM, AXIN2,BAP1,  BARD1, BLM, BMPR1A, BRCA1, BRCA2, BRIP1, CASR, CDC73, CDH1, CDK4, CDKN1B, CDKN1C, CDKN2A (p14ARF), CDKN2A (p16INK4a), CEBPA, CHEK2, CTNNA1, DICER1, DIS3L2, EGFR (c.2369C>T, p.Thr790Met variant only), EPCAM (Deletion/duplication testing only), FH, FLCN, GATA2, GPC3, GREM1 (Promoter region deletion/duplication testing only), HOXB13 (c.251G>A, p.Gly84Glu), HRAS, KIT, MAX, MEN1, MET, MITF (c.952G>A, p.Glu318Lys variant only), MLH1, MSH2, MSH3, MSH6, MUTYH, NBN, NF1, NF2, NTHL1, PALB2, PDGFRA, PHOX2B, PMS2, POLD1, POLE, POT1, PRKAR1A, PTCH1, PTEN, RAD50, RAD51C, RAD51D, RB1, RECQL4, RET, RUNX1, SDHAF2, SDHA (sequence changes only), SDHB, SDHC, SDHD, SMAD4, SMARCA4, SMARCB1, SMARCE1, STK11, SUFU, TERT, TERT, TMEM127, TP53, TSC1, TSC2, VHL, WRN and WT1.  The report date is July 08, 2017.       CANCER STAGING: Cancer Staging Carcinoma of upper-outer quadrant of right breast in female, estrogen receptor positive ([place]) Staging form: Breast, AJCC 8th Edition - Clinical: cT1c, cN0 - Unsigned - Pathologic: Stage IA (pT1c, pN0, cM0, G1, ER: Positive, PR: Positive, HER2: Negative, Oncotype DX score: 11) - Signed by Twana First, MD on  06/13/2017    INTERVAL HISTORY:  Marissa [name] 73 y.o. female seen for follow-up of her right breast cancer.  She is continuing to tolerate anastrozole very well.  She has minor hot flashes.  Denies any musculoskeletal symptoms.  Denies any GI side effects including nausea, vomiting, diarrhea or constipation.  She is taking vitamin D 2000 units daily.  She has tried taking calcium in the past which caused constipation.  No new onset bone pains.  Denies any ER visits or hospitalizations.  Denies any fevers, night sweats or weight loss.  Appetite is 100%.  Energy levels are 75%.   REVIEW OF SYSTEMS:  Review of Systems  Endocrine: Positive for hot flashes.  All other systems reviewed and are negative.    PAST MEDICAL/SURGICAL HISTORY:  Past Medical History:  Diagnosis Date  . Anxiety   . Cancer ([place]) 05/2017   right breast cancer  . Family history of breast cancer   . Family history of ovarian cancer   . Family history of stomach cancer   . GERD (gastroesophageal reflux disease)   . High cholesterol   . History of radiation therapy 07/03/17- 07/31/2017   Right Breast/ 40/05 Gy in 15 fractions, Right Breast boost/ 10 Gy in 5 fractions.   . Hypertension   . Personal history of radiation therapy 2018/2019   Past Surgical History:  Procedure Laterality Date  . BREAST BIOPSY Right 05/09/2017   x2  . BREAST LUMPECTOMY Right 05/20/2017  . CARDIAC CATHETERIZATION     2008- negative findings  . CATARACT EXTRACTION W/PHACO Right 09/06/2014   Procedure: CATARACT EXTRACTION PHACO AND INTRAOCULAR LENS PLACEMENT; CDE:  3.85;  Surgeon: Williams Che, MD;  Location: [facility];  Service: Ophthalmology;  Laterality: Right;  . CATARACT EXTRACTION W/PHACO  Left 12/13/2014   Procedure: CATARACT EXTRACTION PHACO AND INTRAOCULAR LENS PLACEMENT LEFT EYE CDE=10.79;  Surgeon: Williams Che, MD;  Location: [facility];  Service: Ophthalmology;  Laterality: Left;  . ESOPHAGOGASTRODUODENOSCOPY N/A 05/05/2014    Procedure: ESOPHAGOGASTRODUODENOSCOPY (EGD);  Surgeon: Rogene Houston, MD;  Location: [facility];  Service: Endoscopy;  Laterality: N/A;  255  . EUS N/A 06/10/2014   Procedure: UPPER ENDOSCOPIC ULTRASOUND (EUS) LINEAR;  Surgeon: Milus Banister, MD;  Location: [facility];  Service: Endoscopy;  Laterality: N/A;  . RADIOACTIVE SEED GUIDED PARTIAL MASTECTOMY WITH AXILLARY SENTINEL LYMPH NODE BIOPSY Right 05/20/2017   Procedure: RIGHT BREAST LUMPECTOMY WITH RADIOACTIVE SEED AND SENTINEL LYMPH NODE BIOPSY;  Surgeon: Rolm Bookbinder, MD;  Location: [facility];  Service: General;  Laterality: Right;  . TUBAL LIGATION       SOCIAL HISTORY:  Social History   Socioeconomic History  . Marital status: Married    Spouse name: Not on file  . Number of children: 2  . Years of education: Not on file  . Highest education level: Not on file  Occupational History  . Occupation: retired  Scientific laboratory technician  . Financial resource strain: Not on file  . Food insecurity    Worry: Not on file    Inability: Not on file  . Transportation needs    Medical: Not on file    Non-medical: Not on file  Tobacco Use  . Smoking status: Never Smoker  . Smokeless tobacco: Never Used  Substance and Sexual Activity  . Alcohol use: No  . Drug use: No  . Sexual activity: Yes    Birth control/protection: Post-menopausal  Lifestyle  . Physical activity    Days per week: Not on file    Minutes per session: Not on file  . Stress: Not on file  Relationships  . Social Herbalist on phone: Not on file    Gets together: Not on file    Attends religious service: Not on file    Active member of club or organization: Not on file    Attends meetings of clubs or organizations: Not on file    Relationship status: Not on file  . Intimate partner violence    Fear of current or ex partner: Not on file    Emotionally abused: Not on file    Physically abused: Not on file    Forced sexual activity:  Not on file  Other Topics Concern  . Not on file  Social History Narrative  . Not on file    FAMILY HISTORY:  Family History  Problem Relation Age of Onset  . CAD Mother 76  . Leukemia Father   . Parkinson's disease Father   . Breast cancer Daughter 59       BRCA neg  . Heart Problems Maternal Aunt   . Cancer Maternal Uncle        NOS  . Cancer Paternal Uncle        NOS  . Diabetes Maternal Grandfather   . Liver cancer Maternal Grandfather   . Cancer Paternal Grandmother        NOS  . Brain cancer Maternal Aunt   . Lung cancer Maternal Aunt        smoker  . Cancer Maternal Uncle        NOS  . Stomach cancer Maternal Uncle   . Lung cancer Maternal Uncle        smoker  .  Breast cancer Cousin        mat first cousin  . Breast cancer Cousin        mat first cousin dx under 18  . Breast cancer Cousin        2 additional mat first cousins  . Ovarian cancer Cousin        mat first cousin  . Cancer Cousin        several female mat first cousins with cancer NOS  . Breast cancer Cousin        2 pat first cousins  . Cancer Cousin        3 pat first cousins with cancer NOS    CURRENT MEDICATIONS:  Outpatient Encounter Medications as of 12/22/2018  Medication Sig Note  . amLODipine (NORVASC) 10 MG tablet Take 10 mg by mouth daily.   Marland Kitchen amoxicillin-clavulanate (AUGMENTIN) 500-125 MG tablet Take 1 tablet by mouth 2 (two) times daily with a meal.   . aspirin EC 81 MG tablet Take 81 mg by mouth daily.   . cholecalciferol (VITAMIN D) 1000 units tablet Take 2,000 Units by mouth daily.    . ciprofloxacin (CIPRO) 500 MG tablet Take 500 mg by mouth 2 (two) times daily.   Marland Kitchen co-enzyme Q-10 30 MG capsule Take 100 mg by mouth daily.   . diclofenac (VOLTAREN) 75 MG EC tablet    . losartan-hydrochlorothiazide (HYZAAR) 100-25 MG per tablet Take 1 tablet by mouth every morning.  06/25/2016: Patient was advised to take an additional tablet due to Blood pressure levels  . metoprolol succinate  (TOPROL-XL) 25 MG 24 hr tablet Take 25 mg by mouth every morning.    . Omega-3 Fatty Acids (OMEGA 3 500) 500 MG CAPS Take by mouth.   Marland Kitchen omeprazole (PRILOSEC) 20 MG capsule Take 20 mg by mouth daily.   . potassium chloride SA (K-DUR) 20 MEQ tablet TAKE 1 TABLET BY MOUTH EVERY DAY   . [DISCONTINUED] anastrozole (ARIMIDEX) 1 MG tablet Take 1 tablet (1 mg total) by mouth daily.   . potassium chloride SA (K-DUR,KLOR-CON) 20 MEQ tablet Take 1 tablet (20 mEq total) by mouth once for 1 dose.    No facility-administered encounter medications on file as of 12/22/2018.     ALLERGIES:  Allergies  Allergen Reactions  . Nitrostat [Nitroglycerin] Shortness Of Breath     PHYSICAL EXAM:  ECOG Performance status: 1  Vitals:   12/22/18 0813  BP: (!) 145/58  Pulse: 67  Resp: 18  Temp: 98.4 F (36.9 C)  SpO2: 99%   Filed Weights   12/22/18 0813  Weight: 234 lb (106.1 kg)    Physical Exam Constitutional:      Appearance: She is well-developed.  Cardiovascular:     Rate and Rhythm: Normal rate and regular rhythm.     Heart sounds: Normal heart sounds.  Pulmonary:     Effort: Pulmonary effort is normal.     Breath sounds: Normal breath sounds.  Abdominal:     General: There is no distension.     Palpations: Abdomen is soft. There is no mass.  Musculoskeletal: Normal range of motion.  Skin:    General: Skin is warm.  Neurological:     General: No focal deficit present.     Mental Status: She is alert and oriented to person, place, and time.  Psychiatric:        Mood and Affect: Mood normal.        Behavior: Behavior normal.  Breast exam: No palpable mass in bilateral breast.  Lumpectomy scar in the upper outer quadrant of the right breast is unchanged.  No palpable adenopathy.   LABORATORY DATA:  I have reviewed the labs as listed.  CBC    Component Value Date/Time   WBC 7.1 12/18/2018 0804   RBC 4.51 12/18/2018 0804   HGB 13.7 12/18/2018 0804   HCT 41.9 12/18/2018 0804    PLT 260 12/18/2018 0804   MCV 92.9 12/18/2018 0804   MCH 30.4 12/18/2018 0804   MCHC 32.7 12/18/2018 0804   RDW 12.2 12/18/2018 0804   LYMPHSABS 1.5 12/18/2018 0804   MONOABS 0.5 12/18/2018 0804   EOSABS 0.2 12/18/2018 0804   BASOSABS 0.0 12/18/2018 0804   CMP Latest Ref Rng & Units 12/18/2018 06/10/2018 02/07/2018  Glucose 70 - 99 mg/dL 118(H) 95 97  BUN 8 - 23 mg/dL 22 22 22   Creatinine 0.44 - 1.00 mg/dL 0.97 1.01(H) 1.04(H)  Sodium 135 - 145 mmol/L 140 139 137  Potassium 3.5 - 5.1 mmol/L 3.7 2.8(L) 3.7  Chloride 98 - 111 mmol/L 104 106 103  CO2 22 - 32 mmol/L 25 26 27   Calcium 8.9 - 10.3 mg/dL 10.0 9.5 10.2  Total Protein 6.5 - 8.1 g/dL 6.9 7.2 7.1  Total Bilirubin 0.3 - 1.2 mg/dL 0.6 0.3 0.8  Alkaline Phos 38 - 126 U/L 112 106 99  AST 15 - 41 U/L 16 19 19   ALT 0 - 44 U/L 16 17 16        DIAGNOSTIC IMAGING:  I have independently reviewed the scans and discussed with the patient.      ASSESSMENT & PLAN:   Carcinoma of upper-outer quadrant of right breast in female, estrogen receptor positive ([place]) 1.  Right breast infiltrating ductal carcinoma: -Lumpectomy on 06/11/2017, 1.4 cm, 0 out of 2 sentinel lymph nodes positive, margins negative, ER/PR positive, HER-2 negative, Ki-67 of 5% -Oncotype DX recurrence score of 10.  Negative genetic testing for hereditary breast cancer. -Radiation therapy finished in January 2019 -Anastrozole was started on 09/06/2017.  She is tolerating it well with minor hot flashes.  No musculoskeletal symptoms. -Mammogram on 05/12/2018, BI-RADS Category 2. - Physical exam today shows lumpectomy scar in the upper outer quadrant unchanged.  No palpable mass in bilateral breast.  No palpable adenopathy.  Right breast lymphedema in the lower quadrant stable. - We will schedule her mammogram in November.  We will see her back after the mammogram.  I reviewed blood work which shows normal tumor markers.   2.  Mild hypercalcemia: -Her calcium today is 10.   Previously this was mildly high with normal PTH levels. -She will continue vitamin D 2000 units daily.  3.  Osteopenia: - I have obtained and reviewed DEXA scan results from Dr. Trena Platt office dated 08/07/2018.  T score is -1.4.  This is in the osteopenia range. -I have told her to take calcium 1200 mg daily.  She has tried calcium previously which constipated her.  I have talked to her about findings on the DEXA scan and counseled her to take calcium 1200 mg daily.  She will try different brands to avoid constipation.  4.  Hypokalemia: -Potassium is 3.7 today.  She is taking potassium 20 mEq when she develops cramps, approximately once every 2 weeks. -She is on losartan hydrochlorothiazide for blood pressure.  We will continue to monitor her potassium.   Total time spent is 25 minutes with more than 50% of the time spent face-to-face  discussing scan results, treatment plan and coordination of care.  Orders placed this encounter:  Orders Placed This Encounter  Procedures  . MM DIAG BREAST TOMO BILATERAL  . CBC with Differential/Platelet  . Comprehensive metabolic panel  . Cancer antigen 15-3  . Vitamin D 25 hydroxy      Derek Jack, MD [place] [phone]

## 2018-12-22 NOTE — Assessment & Plan Note (Addendum)
1.  Right breast infiltrating ductal carcinoma: -Lumpectomy on 06/11/2017, 1.4 cm, 0 out of 2 sentinel lymph nodes positive, margins negative, ER/PR positive, HER-2 negative, Ki-67 of 5% -Oncotype DX recurrence score of 10.  Negative genetic testing for hereditary breast cancer. -Radiation therapy finished in January 2019 -Anastrozole was started on 09/06/2017.  She is tolerating it well with minor hot flashes.  No musculoskeletal symptoms. -Mammogram on 05/12/2018, BI-RADS Category 2. - Physical exam today shows lumpectomy scar in the upper outer quadrant unchanged.  No palpable mass in bilateral breast.  No palpable adenopathy.  Right breast lymphedema in the lower quadrant stable. - We will schedule her mammogram in November.  We will see her back after the mammogram.  I reviewed blood work which shows normal tumor markers.   2.  Mild hypercalcemia: -Her calcium today is 10.  Previously this was mildly high with normal PTH levels. -She will continue vitamin D 2000 units daily.  3.  Osteopenia: - I have obtained and reviewed DEXA scan results from Dr. Trena Platt office dated 08/07/2018.  T score is -1.4.  This is in the osteopenia range. -I have told her to take calcium 1200 mg daily.  She has tried calcium previously which constipated her.  I have talked to her about findings on the DEXA scan and counseled her to take calcium 1200 mg daily.  She will try different brands to avoid constipation.  4.  Hypokalemia: -Potassium is 3.7 today.  She is taking potassium 20 mEq when she develops cramps, approximately once every 2 weeks. -She is on losartan hydrochlorothiazide for blood pressure.  We will continue to monitor her potassium.

## 2018-12-22 NOTE — Patient Instructions (Addendum)
[place] at [facility] Discharge Instructions  You were seen today by Dr. Delton Coombes. He went over your recent lab results. He will see you back in 5 months for labs and follow up.   Thank you for choosing [place] at [facility] to provide your oncology and hematology care.  To afford each patient quality time with our provider, please arrive at least 15 minutes before your scheduled appointment time.   If you have a lab appointment with the [facility] please come in thru the  Main Entrance and check in at the main information desk  You need to re-schedule your appointment should you arrive 10 or more minutes late.  We strive to give you quality time with our providers, and arriving late affects you and other patients whose appointments are after yours.  Also, if you no show three or more times for appointments you may be dismissed from the clinic at the providers discretion.     Again, thank you for choosing [facility].  Our hope is that these requests will decrease the amount of time that you wait before being seen by our physicians.       _____________________________________________________________  Should you have questions after your visit to [facility], please contact our office at [phone] between the hours of 8:00 a.m. and 4:30 p.m.  Voicemails left after 4:00 p.m. will not be returned until the following business day.  For prescription refill requests, have your pharmacy contact our office and allow 72 hours.    Cancer Center Support Programs:   > Cancer Support Group  2nd Tuesday of the month 1pm-2pm, Journey Room

## 2018-12-25 DIAGNOSIS — Z6838 Body mass index (BMI) 38.0-38.9, adult: Secondary | ICD-10-CM | POA: Diagnosis not present

## 2018-12-25 DIAGNOSIS — I10 Essential (primary) hypertension: Secondary | ICD-10-CM | POA: Diagnosis not present

## 2018-12-25 DIAGNOSIS — M1711 Unilateral primary osteoarthritis, right knee: Secondary | ICD-10-CM | POA: Diagnosis not present

## 2018-12-25 DIAGNOSIS — E7800 Pure hypercholesterolemia, unspecified: Secondary | ICD-10-CM | POA: Diagnosis not present

## 2018-12-25 DIAGNOSIS — Z299 Encounter for prophylactic measures, unspecified: Secondary | ICD-10-CM | POA: Diagnosis not present

## 2019-01-01 ENCOUNTER — Other Ambulatory Visit (HOSPITAL_COMMUNITY): Payer: Self-pay | Admitting: *Deleted

## 2019-01-01 DIAGNOSIS — C50411 Malignant neoplasm of upper-outer quadrant of right female breast: Secondary | ICD-10-CM

## 2019-01-01 MED ORDER — ANASTROZOLE 1 MG PO TABS: 1.0000 mg | ORAL_TABLET | Freq: Every day | ORAL | 3 refills | Status: DC

## 2019-01-06 DIAGNOSIS — H43811 Vitreous degeneration, right eye: Secondary | ICD-10-CM | POA: Diagnosis not present

## 2019-01-13 DIAGNOSIS — E7800 Pure hypercholesterolemia, unspecified: Secondary | ICD-10-CM | POA: Diagnosis not present

## 2019-01-13 DIAGNOSIS — I10 Essential (primary) hypertension: Secondary | ICD-10-CM | POA: Diagnosis not present

## 2019-01-15 DIAGNOSIS — M1711 Unilateral primary osteoarthritis, right knee: Secondary | ICD-10-CM | POA: Diagnosis not present

## 2019-01-15 DIAGNOSIS — Z6838 Body mass index (BMI) 38.0-38.9, adult: Secondary | ICD-10-CM | POA: Diagnosis not present

## 2019-01-15 DIAGNOSIS — M1990 Unspecified osteoarthritis, unspecified site: Secondary | ICD-10-CM | POA: Diagnosis not present

## 2019-01-15 DIAGNOSIS — Z299 Encounter for prophylactic measures, unspecified: Secondary | ICD-10-CM | POA: Diagnosis not present

## 2019-01-15 DIAGNOSIS — I10 Essential (primary) hypertension: Secondary | ICD-10-CM | POA: Diagnosis not present

## 2019-02-02 ENCOUNTER — Other Ambulatory Visit: Payer: Medicare Other

## 2019-02-02 ENCOUNTER — Other Ambulatory Visit: Payer: Self-pay

## 2019-02-02 DIAGNOSIS — R6889 Other general symptoms and signs: Secondary | ICD-10-CM | POA: Diagnosis not present

## 2019-02-02 DIAGNOSIS — Z20822 Contact with and (suspected) exposure to covid-19: Secondary | ICD-10-CM

## 2019-02-04 LAB — NOVEL CORONAVIRUS, NAA: SARS-CoV-2, NAA: NOT DETECTED

## 2019-02-06 DIAGNOSIS — I10 Essential (primary) hypertension: Secondary | ICD-10-CM | POA: Diagnosis not present

## 2019-02-06 DIAGNOSIS — Z6838 Body mass index (BMI) 38.0-38.9, adult: Secondary | ICD-10-CM | POA: Diagnosis not present

## 2019-02-06 DIAGNOSIS — E7800 Pure hypercholesterolemia, unspecified: Secondary | ICD-10-CM | POA: Diagnosis not present

## 2019-02-06 DIAGNOSIS — N183 Chronic kidney disease, stage 3 (moderate): Secondary | ICD-10-CM | POA: Diagnosis not present

## 2019-02-06 DIAGNOSIS — Z299 Encounter for prophylactic measures, unspecified: Secondary | ICD-10-CM | POA: Diagnosis not present

## 2019-02-12 DIAGNOSIS — E7800 Pure hypercholesterolemia, unspecified: Secondary | ICD-10-CM | POA: Diagnosis not present

## 2019-02-12 DIAGNOSIS — I10 Essential (primary) hypertension: Secondary | ICD-10-CM | POA: Diagnosis not present

## 2019-03-17 DIAGNOSIS — I10 Essential (primary) hypertension: Secondary | ICD-10-CM | POA: Diagnosis not present

## 2019-03-17 DIAGNOSIS — E7800 Pure hypercholesterolemia, unspecified: Secondary | ICD-10-CM | POA: Diagnosis not present

## 2019-04-14 DIAGNOSIS — M549 Dorsalgia, unspecified: Secondary | ICD-10-CM | POA: Diagnosis not present

## 2019-04-14 DIAGNOSIS — Z6837 Body mass index (BMI) 37.0-37.9, adult: Secondary | ICD-10-CM | POA: Diagnosis not present

## 2019-04-14 DIAGNOSIS — R350 Frequency of micturition: Secondary | ICD-10-CM | POA: Diagnosis not present

## 2019-04-14 DIAGNOSIS — Z299 Encounter for prophylactic measures, unspecified: Secondary | ICD-10-CM | POA: Diagnosis not present

## 2019-04-14 DIAGNOSIS — E7800 Pure hypercholesterolemia, unspecified: Secondary | ICD-10-CM | POA: Diagnosis not present

## 2019-04-14 DIAGNOSIS — I10 Essential (primary) hypertension: Secondary | ICD-10-CM | POA: Diagnosis not present

## 2019-04-16 ENCOUNTER — Encounter (HOSPITAL_COMMUNITY): Payer: Self-pay | Admitting: Emergency Medicine

## 2019-04-16 ENCOUNTER — Emergency Department (HOSPITAL_COMMUNITY)
Admission: EM | Admit: 2019-04-16 | Discharge: 2019-04-16 | Disposition: A | Discharge status: Home / Self Care | Payer: Medicare Other | Attending: Emergency Medicine | Admitting: Emergency Medicine

## 2019-04-16 ENCOUNTER — Other Ambulatory Visit: Payer: Self-pay

## 2019-04-16 ENCOUNTER — Emergency Department (HOSPITAL_COMMUNITY): Payer: Medicare Other

## 2019-04-16 DIAGNOSIS — R109 Unspecified abdominal pain: Secondary | ICD-10-CM

## 2019-04-16 DIAGNOSIS — R93422 Abnormal radiologic findings on diagnostic imaging of left kidney: Secondary | ICD-10-CM | POA: Diagnosis not present

## 2019-04-16 DIAGNOSIS — N83291 Other ovarian cyst, right side: Secondary | ICD-10-CM | POA: Diagnosis not present

## 2019-04-16 DIAGNOSIS — N281 Cyst of kidney, acquired: Secondary | ICD-10-CM | POA: Diagnosis not present

## 2019-04-16 DIAGNOSIS — Z170 Estrogen receptor positive status [ER+]: Secondary | ICD-10-CM | POA: Diagnosis not present

## 2019-04-16 DIAGNOSIS — N2889 Other specified disorders of kidney and ureter: Secondary | ICD-10-CM | POA: Diagnosis not present

## 2019-04-16 DIAGNOSIS — I10 Essential (primary) hypertension: Secondary | ICD-10-CM | POA: Insufficient documentation

## 2019-04-16 DIAGNOSIS — N83201 Unspecified ovarian cyst, right side: Secondary | ICD-10-CM

## 2019-04-16 DIAGNOSIS — R1032 Left lower quadrant pain: Secondary | ICD-10-CM | POA: Insufficient documentation

## 2019-04-16 DIAGNOSIS — R1030 Lower abdominal pain, unspecified: Secondary | ICD-10-CM | POA: Diagnosis not present

## 2019-04-16 DIAGNOSIS — E7800 Pure hypercholesterolemia, unspecified: Secondary | ICD-10-CM | POA: Diagnosis not present

## 2019-04-16 DIAGNOSIS — C50411 Malignant neoplasm of upper-outer quadrant of right female breast: Secondary | ICD-10-CM | POA: Insufficient documentation

## 2019-04-16 DIAGNOSIS — M549 Dorsalgia, unspecified: Secondary | ICD-10-CM | POA: Diagnosis not present

## 2019-04-16 LAB — URINALYSIS, ROUTINE W REFLEX MICROSCOPIC
Bacteria, UA: NONE SEEN
Bilirubin Urine: NEGATIVE
Glucose, UA: NEGATIVE mg/dL
Hgb urine dipstick: NEGATIVE
Ketones, ur: NEGATIVE mg/dL
Nitrite: NEGATIVE
Protein, ur: NEGATIVE mg/dL
Specific Gravity, Urine: 1.018 (ref 1.005–1.030)
pH: 6 (ref 5.0–8.0)

## 2019-04-16 MED ORDER — OXYCODONE-ACETAMINOPHEN 5-325 MG PO TABS: 1.0000 | ORAL_TABLET | Freq: Four times a day (QID) | ORAL | 0 refills | Status: DC | PRN

## 2019-04-16 MED ORDER — DICLOFENAC SODIUM 1 % TD GEL: 2.0000 g | Freq: Four times a day (QID) | TRANSDERMAL | 0 refills | Status: AC | PRN

## 2019-04-16 MED ORDER — KETOROLAC TROMETHAMINE 30 MG/ML IJ SOLN
30.0000 mg | Freq: Once | INTRAMUSCULAR | Status: AC
  Administered 2019-04-16: 08:00:00 30 mg via INTRAVENOUS
  Filled 2019-04-16: qty 1

## 2019-04-16 MED ORDER — OXYCODONE-ACETAMINOPHEN 5-325 MG PO TABS
1.0000 | ORAL_TABLET | Freq: Once | ORAL | Status: AC
  Administered 2019-04-16: 1 via ORAL
  Filled 2019-04-16: qty 1

## 2019-04-16 NOTE — ED Provider Notes (Signed)
Emergency Department Provider Note   I have reviewed the triage vital signs and the nursing notes.   HISTORY  Chief Complaint Flank Pain   HPI Marissa [name] is a 73 y.o. female with past medical history reviewed below presents to the emergency department for evaluation of worsening left flank pain.  She has had intermittent pain over the past week and saw her primary care doctor on Tuesday.  She was prescribed tizanidine and has been taking this as prescribed with no relief in symptoms.  She states that pain has become more severe and now constant over the past 12 hours.  She feels it radiating around from her left lower back to her left flank and lower abdomen.  No diarrhea.  No UTI symptoms.  No blood in the urine.  No fevers or chills.  No history of kidney stone.  No radiation down the leg, weakness, numbness.  No bowel or bladder symptoms.  Past Medical History:  Diagnosis Date  . Anxiety   . Cancer ([place]) 05/2017   right breast cancer  . Family history of breast cancer   . Family history of ovarian cancer   . Family history of stomach cancer   . GERD (gastroesophageal reflux disease)   . High cholesterol   . History of radiation therapy 07/03/17- 07/31/2017   Right Breast/ 40/05 Gy in 15 fractions, Right Breast boost/ 10 Gy in 5 fractions.   . Hypertension   . Personal history of radiation therapy 2018/2019    Patient Active Problem List   Diagnosis Date Noted  . Genetic testing 07/08/2017  . Family history of breast cancer   . Family history of ovarian cancer   . Family history of stomach cancer   . Carcinoma of upper-outer quadrant of right breast in female, estrogen receptor positive ([place]) 06/11/2017  . Hyperlipidemia 06/25/2016  . Pain in joint, shoulder region 06/25/2016  . Gastric mass 06/10/2014  . Chest pain 02/15/2014  . HTN (hypertension) 02/15/2014  . Morbid obesity ([place]) 02/15/2014    Past Surgical History:  Procedure Laterality Date  .  BREAST BIOPSY Right 05/09/2017   x2  . BREAST LUMPECTOMY Right 05/20/2017  . CARDIAC CATHETERIZATION     2008- negative findings  . CATARACT EXTRACTION W/PHACO Right 09/06/2014   Procedure: CATARACT EXTRACTION PHACO AND INTRAOCULAR LENS PLACEMENT; CDE:  3.85;  Surgeon: Williams Che, MD;  Location: [facility];  Service: Ophthalmology;  Laterality: Right;  . CATARACT EXTRACTION W/PHACO Left 12/13/2014   Procedure: CATARACT EXTRACTION PHACO AND INTRAOCULAR LENS PLACEMENT LEFT EYE CDE=10.79;  Surgeon: Williams Che, MD;  Location: [facility];  Service: Ophthalmology;  Laterality: Left;  . ESOPHAGOGASTRODUODENOSCOPY N/A 05/05/2014   Procedure: ESOPHAGOGASTRODUODENOSCOPY (EGD);  Surgeon: Rogene Houston, MD;  Location: [facility];  Service: Endoscopy;  Laterality: N/A;  255  . EUS N/A 06/10/2014   Procedure: UPPER ENDOSCOPIC ULTRASOUND (EUS) LINEAR;  Surgeon: Milus Banister, MD;  Location: [facility];  Service: Endoscopy;  Laterality: N/A;  . RADIOACTIVE SEED GUIDED PARTIAL MASTECTOMY WITH AXILLARY SENTINEL LYMPH NODE BIOPSY Right 05/20/2017   Procedure: RIGHT BREAST LUMPECTOMY WITH RADIOACTIVE SEED AND SENTINEL LYMPH NODE BIOPSY;  Surgeon: Rolm Bookbinder, MD;  Location: [facility];  Service: General;  Laterality: Right;  . TUBAL LIGATION      Allergies Nitrostat [nitroglycerin]  Family History  Problem Relation Age of Onset  . CAD Mother 15  . Leukemia Father   . Parkinson's disease Father   .  Breast cancer Daughter 66       BRCA neg  . Heart Problems Maternal Aunt   . Cancer Maternal Uncle        NOS  . Cancer Paternal Uncle        NOS  . Diabetes Maternal Grandfather   . Liver cancer Maternal Grandfather   . Cancer Paternal Grandmother        NOS  . Brain cancer Maternal Aunt   . Lung cancer Maternal Aunt        smoker  . Cancer Maternal Uncle        NOS  . Stomach cancer Maternal Uncle   . Lung cancer Maternal Uncle        smoker  . Breast cancer Cousin         mat first cousin  . Breast cancer Cousin        mat first cousin dx under 61  . Breast cancer Cousin        2 additional mat first cousins  . Ovarian cancer Cousin        mat first cousin  . Cancer Cousin        several female mat first cousins with cancer NOS  . Breast cancer Cousin        2 pat first cousins  . Cancer Cousin        3 pat first cousins with cancer NOS    Social History Social History   Tobacco Use  . Smoking status: Never Smoker  . Smokeless tobacco: Never Used  Substance Use Topics  . Alcohol use: No  . Drug use: No    Review of Systems  Constitutional: No fever/chills Eyes: No visual changes. ENT: No sore throat. Cardiovascular: Denies chest pain. Respiratory: Denies shortness of breath. Gastrointestinal: Positive left flank/abdominal pain. No nausea, no vomiting.  No diarrhea.  No constipation. Genitourinary: Negative for dysuria. Musculoskeletal: Negative for back pain. Skin: Negative for rash. Neurological: Negative for headaches, focal weakness or numbness.  10-point ROS otherwise negative.  ____________________________________________   PHYSICAL EXAM:  VITAL SIGNS: ED Triage Vitals  Enc Vitals Group     BP 04/16/19 0733 139/90     Pulse Rate 04/16/19 0733 93     Resp 04/16/19 0733 20     Temp 04/16/19 0733 98.1 F (36.7 C)     Temp Source 04/16/19 0733 Oral     SpO2 04/16/19 0733 99 %     Weight 04/16/19 0734 220 lb (99.8 kg)     Height 04/16/19 0734 5' 5"  (1.651 m)   Constitutional: Alert and oriented. Patient appears uncomfortable with frequent shifting in bed.  Eyes: Conjunctivae are normal. Head: Atraumatic. Nose: No congestion/rhinnorhea. Mouth/Throat: Mucous membranes are moist.   Neck: No stridor.  Cardiovascular: Normal rate, regular rhythm.  Respiratory: Normal respiratory effort.  Gastrointestinal: Soft and nontender. No distention. Mild tenderness to percussion of the left flank.  Musculoskeletal: No lower  extremity tenderness nor edema. No gross deformities of extremities. No midline thoracic or lumbar spine tenderness.  Neurologic:  Normal speech and language. No gross focal neurologic deficits are appreciated. Normal strength and sensation in the bilateral LEs.  Skin:  Skin is warm, dry and intact. No rash noted.  ____________________________________________   LABS (all labs ordered are listed, but only abnormal results are displayed)  Labs Reviewed  URINALYSIS, ROUTINE W REFLEX MICROSCOPIC - Abnormal; Notable for the following components:      Result Value   Leukocytes,Ua TRACE (*)  All other components within normal limits   ____________________________________________  RADIOLOGY  Ct Renal Stone Study  Addendum Date: 04/16/2019   ADDENDUM REPORT: 04/16/2019 09:31 ADDENDUM: These results were called by telephone at the time of interpretation on 04/16/2019 at 9:31 am to provider JOSHUA LONG , who verbally acknowledged these results. Electronically Signed   By: Zetta Bills M.D.   On: 04/16/2019 09:31   Result Date: 04/16/2019 CLINICAL DATA:  Stone disease, flank pain with history of breast cancer. No comparison is available for review. EXAM: CT ABDOMEN AND PELVIS WITHOUT CONTRAST TECHNIQUE: Multidetector CT imaging of the abdomen and pelvis was performed following the standard protocol without IV contrast. COMPARISON:  No comparison is available for review FINDINGS: Lower chest: Lungs are clear without consolidation or pleural effusion. Skin thickening of the right breast likely related to prior treatment a breast cancer. Hepatobiliary: Multiple calcified gallstones in the gallbladder lumen. No signs of pericholecystic inflammation or biliary ductal dilation. Pancreas: Unremarkable. No pancreatic ductal dilatation or surrounding inflammatory changes. Spleen: Normal in size without focal abnormality. Adrenals/Urinary Tract: Normal appearance of bilateral adrenal glands. No signs of  hydronephrosis with multiple renal sinus cysts seen in the kidneys bilaterally. No signs of renal or ureteral calculi. 1.1 x 1.1 cm area in the left upper pole there is a small exophytic lesion, potentially solid, mean density 35 Hounsfield units. Stomach/Bowel: Small hiatal hernia. No signs of bowel obstruction or acute bowel process. Normal appendix. Signs of colonic diverticulosis. Vascular/Lymphatic: Calcified atherosclerosis throughout the abdominal aorta tracking in the iliac vessels. No signs of aneurysm. No signs of pelvic adenopathy.  No signs of free pelvic fluid. Reproductive: Cystic area in the right adnexa measuring 2.4 x 1.8 cm. Uterus remains in place with phleboliths in parametrial vessels. Other: No abdominal wall hernia or abnormality. No abdominopelvic ascites. Musculoskeletal: No signs of acute bone finding or destructive bone process. Degenerative changes present in the spine. IMPRESSION: No signs of renal or ureteral calculi. No acute intra-abdominal findings. Small low-density lesion associated with right ovary, based on size current ACR guidelines recommend follow-up ultrasound in this late postmenopausal patient. Small solid renal lesion versus hemorrhagic cyst in the upper pole the left kidney. This might also be assessed with ultrasound as an initial means in further evaluating this patient. Cholelithiasis. Normal appendix and diverticulosis. These results will be called to the ordering clinician or representative by the Radiologist Assistant, and communication documented in the PACS or zVision Dashboard. Electronically Signed: By: Zetta Bills M.D. On: 04/16/2019 09:18    ____________________________________________   PROCEDURES  Procedure(s) performed:   Procedures  None  ____________________________________________   INITIAL IMPRESSION / ASSESSMENT AND PLAN / ED COURSE  Pertinent labs & imaging results that were available during my care of the patient were reviewed  by me and considered in my medical decision making (see chart for details).   Patient presents to the emergency department for evaluation of intermittent but now constant/more severe left flank pain.  Differential includes MSK, kidney stone with much of the lower suspicion for vascular pathology such as aortic dissection or AAA.  Given the patient's age and lack of prior kidney stone or similar symptoms I do plan for CT imaging of the abdomen and pelvis without contrast/renal protocol.  UA pending.  Plan for Toradol and reassess.  UA reviewed with no hematuria or evidence of infection.  I discussed the CT findings with radiology.  Both the left kidney and right ovary findings can be evaluated by nonemergency  ultrasound.  Patient with some continued discomfort here in the emergency department.  No evidence of aortic aneurysm on CT. no clinical findings to suspect dissection.  Plan for pain control at home for likely MSK etiology with close follow-up through her PCP for ultrasound.  Discussed CT findings with the patient as well and provided information at discharge regarding follow-up.  ____________________________________________  FINAL CLINICAL IMPRESSION(S) / ED DIAGNOSES  Final diagnoses:  Left flank pain  Right ovarian cyst  Left renal mass     MEDICATIONS GIVEN DURING THIS VISIT:  Medications  oxyCODONE-acetaminophen (PERCOCET/ROXICET) 5-325 MG per tablet 1 tablet (has no administration in time range)  ketorolac (TORADOL) 30 MG/ML injection 30 mg (30 mg Intravenous Given 04/16/19 0805)     NEW OUTPATIENT MEDICATIONS STARTED DURING THIS VISIT:  New Prescriptions   DICLOFENAC SODIUM (VOLTAREN) 1 % GEL    Apply 2 g topically 4 (four) times daily as needed.   OXYCODONE-ACETAMINOPHEN (PERCOCET/ROXICET) 5-325 MG TABLET    Take 1 tablet by mouth every 6 (six) hours as needed for severe pain.    Note:  This document was prepared using Dragon voice recognition software and may include  unintentional dictation errors.  Nanda Quinton, MD, [facility] Emergency Medicine    Long, Wonda [facility], MD 04/16/19 [phone]

## 2019-04-16 NOTE — Discharge Instructions (Signed)
You were seen in the emergency department today with pain in the left back and side.  There were 2 incidental findings on your CT scan.  You have a small mass on the left kidney and a right ovarian cyst.  The radiologist is recommending follow-up ultrasound through your primary care doctor for both of these.   I suspect that your pain is caused by muscle strain.  Please take the Percocet only as needed for severe pain.  This can cause constipation, sleepiness, and increase the likelihood of falling.  Please only use for severe symptoms.   Return to [facility]ED

## 2019-04-16 NOTE — ED Triage Notes (Signed)
Pt c/o LT flank pain x 1 week. Pt saw PCP on Tuesday and prescribed muscle relaxers. Denies any relief. Denies urinary symptoms.

## 2019-04-17 ENCOUNTER — Ambulatory Visit (HOSPITAL_COMMUNITY)
Admission: RE | Admit: 2019-04-17 | Discharge: 2019-04-17 | Disposition: A | Discharge status: Home / Self Care | Payer: Medicare Other | Source: Ambulatory Visit | Attending: Internal Medicine | Admitting: Internal Medicine

## 2019-04-17 ENCOUNTER — Other Ambulatory Visit: Payer: Self-pay | Admitting: Internal Medicine

## 2019-04-17 ENCOUNTER — Other Ambulatory Visit (HOSPITAL_COMMUNITY): Payer: Self-pay | Admitting: Internal Medicine

## 2019-04-17 DIAGNOSIS — Z6839 Body mass index (BMI) 39.0-39.9, adult: Secondary | ICD-10-CM | POA: Diagnosis not present

## 2019-04-17 DIAGNOSIS — K760 Fatty (change of) liver, not elsewhere classified: Secondary | ICD-10-CM | POA: Diagnosis not present

## 2019-04-17 DIAGNOSIS — N281 Cyst of kidney, acquired: Secondary | ICD-10-CM

## 2019-04-17 DIAGNOSIS — K8020 Calculus of gallbladder without cholecystitis without obstruction: Secondary | ICD-10-CM | POA: Diagnosis not present

## 2019-04-17 DIAGNOSIS — I10 Essential (primary) hypertension: Secondary | ICD-10-CM | POA: Diagnosis not present

## 2019-04-17 DIAGNOSIS — Z299 Encounter for prophylactic measures, unspecified: Secondary | ICD-10-CM | POA: Diagnosis not present

## 2019-04-17 DIAGNOSIS — N83201 Unspecified ovarian cyst, right side: Secondary | ICD-10-CM | POA: Insufficient documentation

## 2019-04-17 DIAGNOSIS — N949 Unspecified condition associated with female genital organs and menstrual cycle: Secondary | ICD-10-CM

## 2019-04-17 DIAGNOSIS — N839 Noninflammatory disorder of ovary, fallopian tube and broad ligament, unspecified: Secondary | ICD-10-CM | POA: Diagnosis not present

## 2019-04-17 DIAGNOSIS — N83209 Unspecified ovarian cyst, unspecified side: Secondary | ICD-10-CM

## 2019-04-17 DIAGNOSIS — N83202 Unspecified ovarian cyst, left side: Secondary | ICD-10-CM

## 2019-04-18 DIAGNOSIS — Z23 Encounter for immunization: Secondary | ICD-10-CM | POA: Diagnosis not present

## 2019-04-19 ENCOUNTER — Other Ambulatory Visit (HOSPITAL_COMMUNITY): Payer: Self-pay | Admitting: Nurse Practitioner

## 2019-04-19 DIAGNOSIS — C50411 Malignant neoplasm of upper-outer quadrant of right female breast: Secondary | ICD-10-CM

## 2019-04-22 DIAGNOSIS — I10 Essential (primary) hypertension: Secondary | ICD-10-CM | POA: Diagnosis not present

## 2019-04-22 DIAGNOSIS — N281 Cyst of kidney, acquired: Secondary | ICD-10-CM | POA: Diagnosis not present

## 2019-04-22 DIAGNOSIS — Z6837 Body mass index (BMI) 37.0-37.9, adult: Secondary | ICD-10-CM | POA: Diagnosis not present

## 2019-04-22 DIAGNOSIS — M1990 Unspecified osteoarthritis, unspecified site: Secondary | ICD-10-CM | POA: Diagnosis not present

## 2019-04-22 DIAGNOSIS — K8020 Calculus of gallbladder without cholecystitis without obstruction: Secondary | ICD-10-CM | POA: Diagnosis not present

## 2019-04-22 DIAGNOSIS — Z299 Encounter for prophylactic measures, unspecified: Secondary | ICD-10-CM | POA: Diagnosis not present

## 2019-04-22 DIAGNOSIS — N83201 Unspecified ovarian cyst, right side: Secondary | ICD-10-CM | POA: Diagnosis not present

## 2019-04-23 DIAGNOSIS — D3911 Neoplasm of uncertain behavior of right ovary: Secondary | ICD-10-CM | POA: Diagnosis not present

## 2019-04-23 DIAGNOSIS — N83201 Unspecified ovarian cyst, right side: Secondary | ICD-10-CM | POA: Diagnosis not present

## 2019-05-13 ENCOUNTER — Inpatient Hospital Stay (HOSPITAL_COMMUNITY): Payer: Medicare Other | Attending: Hematology

## 2019-05-13 ENCOUNTER — Other Ambulatory Visit: Payer: Self-pay

## 2019-05-13 DIAGNOSIS — M8580 Other specified disorders of bone density and structure, unspecified site: Secondary | ICD-10-CM | POA: Diagnosis not present

## 2019-05-13 DIAGNOSIS — Z800 Family history of malignant neoplasm of digestive organs: Secondary | ICD-10-CM | POA: Diagnosis not present

## 2019-05-13 DIAGNOSIS — Z7982 Long term (current) use of aspirin: Secondary | ICD-10-CM | POA: Insufficient documentation

## 2019-05-13 DIAGNOSIS — I10 Essential (primary) hypertension: Secondary | ICD-10-CM | POA: Insufficient documentation

## 2019-05-13 DIAGNOSIS — Z79899 Other long term (current) drug therapy: Secondary | ICD-10-CM | POA: Diagnosis not present

## 2019-05-13 DIAGNOSIS — Z923 Personal history of irradiation: Secondary | ICD-10-CM | POA: Diagnosis not present

## 2019-05-13 DIAGNOSIS — Z8041 Family history of malignant neoplasm of ovary: Secondary | ICD-10-CM | POA: Diagnosis not present

## 2019-05-13 DIAGNOSIS — Z79811 Long term (current) use of aromatase inhibitors: Secondary | ICD-10-CM | POA: Insufficient documentation

## 2019-05-13 DIAGNOSIS — N83201 Unspecified ovarian cyst, right side: Secondary | ICD-10-CM | POA: Insufficient documentation

## 2019-05-13 DIAGNOSIS — C50411 Malignant neoplasm of upper-outer quadrant of right female breast: Secondary | ICD-10-CM | POA: Diagnosis not present

## 2019-05-13 DIAGNOSIS — F419 Anxiety disorder, unspecified: Secondary | ICD-10-CM | POA: Insufficient documentation

## 2019-05-13 DIAGNOSIS — N289 Disorder of kidney and ureter, unspecified: Secondary | ICD-10-CM | POA: Insufficient documentation

## 2019-05-13 DIAGNOSIS — Z170 Estrogen receptor positive status [ER+]: Secondary | ICD-10-CM | POA: Insufficient documentation

## 2019-05-13 DIAGNOSIS — Z803 Family history of malignant neoplasm of breast: Secondary | ICD-10-CM | POA: Diagnosis not present

## 2019-05-13 LAB — COMPREHENSIVE METABOLIC PANEL
ALT: 15 U/L (ref 0–44)
AST: 15 U/L (ref 15–41)
Albumin: 4 g/dL (ref 3.5–5.0)
Alkaline Phosphatase: 101 U/L (ref 38–126)
Anion gap: 11 (ref 5–15)
BUN: 21 mg/dL (ref 8–23)
CO2: 25 mmol/L (ref 22–32)
Calcium: 10.4 mg/dL — ABNORMAL HIGH (ref 8.9–10.3)
Chloride: 101 mmol/L (ref 98–111)
Creatinine, Ser: 0.91 mg/dL (ref 0.44–1.00)
GFR calc Af Amer: >60 mL/min (ref >60)
GFR calc non Af Amer: >60 mL/min (ref >60)
Glucose, Bld: 94 mg/dL (ref 70–99)
Potassium: 3.8 mmol/L (ref 3.5–5.1)
Sodium: 137 mmol/L (ref 135–145)
Total Bilirubin: 0.9 mg/dL (ref 0.3–1.2)
Total Protein: 7.1 g/dL (ref 6.5–8.1)

## 2019-05-13 LAB — CBC WITH DIFFERENTIAL/PLATELET
Abs Immature Granulocytes: 0.02 10*3/uL (ref 0.00–0.07)
Basophils Absolute: 0 10*3/uL (ref 0.0–0.1)
Basophils Relative: 1 %
Eosinophils Absolute: 0.2 10*3/uL (ref 0.0–0.5)
Eosinophils Relative: 3 %
HCT: 44.2 % (ref 36.0–46.0)
Hemoglobin: 14.3 g/dL (ref 12.0–15.0)
Immature Granulocytes: 0 %
Lymphocytes Relative: 23 %
Lymphs Abs: 1.5 10*3/uL (ref 0.7–4.0)
MCH: 30.6 pg (ref 26.0–34.0)
MCHC: 32.4 g/dL (ref 30.0–36.0)
MCV: 94.4 fL (ref 80.0–100.0)
Monocytes Absolute: 0.4 10*3/uL (ref 0.1–1.0)
Monocytes Relative: 6 %
Neutro Abs: 4.3 10*3/uL (ref 1.7–7.7)
Neutrophils Relative %: 67 %
Platelets: 243 10*3/uL (ref 150–400)
RBC: 4.68 MIL/uL (ref 3.87–5.11)
RDW: 12 % (ref 11.5–15.5)
WBC: 6.4 10*3/uL (ref 4.0–10.5)
nRBC: 0 % (ref 0.0–0.2)

## 2019-05-13 LAB — VITAMIN D 25 HYDROXY (VIT D DEFICIENCY, FRACTURES): Vit D, 25-Hydroxy: 35.79 ng/mL (ref 30–100)

## 2019-05-14 ENCOUNTER — Other Ambulatory Visit (HOSPITAL_COMMUNITY): Payer: Medicare Other

## 2019-05-14 ENCOUNTER — Ambulatory Visit
Admission: RE | Admit: 2019-05-14 | Discharge: 2019-05-14 | Disposition: A | Discharge status: Home / Self Care | Payer: Medicare Other | Source: Ambulatory Visit | Attending: Hematology | Admitting: Hematology

## 2019-05-14 ENCOUNTER — Other Ambulatory Visit: Payer: Self-pay

## 2019-05-14 DIAGNOSIS — R928 Other abnormal and inconclusive findings on diagnostic imaging of breast: Secondary | ICD-10-CM | POA: Diagnosis not present

## 2019-05-14 DIAGNOSIS — C50411 Malignant neoplasm of upper-outer quadrant of right female breast: Secondary | ICD-10-CM

## 2019-05-14 DIAGNOSIS — Z170 Estrogen receptor positive status [ER+]: Secondary | ICD-10-CM

## 2019-05-14 DIAGNOSIS — Z853 Personal history of malignant neoplasm of breast: Secondary | ICD-10-CM | POA: Diagnosis not present

## 2019-05-14 LAB — CANCER ANTIGEN 15-3: CA 15-3: 6.6 U/mL (ref 0.0–25.0)

## 2019-05-18 ENCOUNTER — Inpatient Hospital Stay (HOSPITAL_BASED_OUTPATIENT_CLINIC_OR_DEPARTMENT_OTHER): Payer: Medicare Other | Admitting: Hematology

## 2019-05-18 ENCOUNTER — Encounter (HOSPITAL_COMMUNITY): Payer: Self-pay | Admitting: Hematology

## 2019-05-18 ENCOUNTER — Other Ambulatory Visit: Payer: Self-pay

## 2019-05-18 VITALS — BP 127/62 | HR 74 | Temp 97.4°F | Resp 18 | Wt 231.7 lb

## 2019-05-18 DIAGNOSIS — Z170 Estrogen receptor positive status [ER+]: Secondary | ICD-10-CM

## 2019-05-18 DIAGNOSIS — C50411 Malignant neoplasm of upper-outer quadrant of right female breast: Secondary | ICD-10-CM | POA: Diagnosis not present

## 2019-05-18 NOTE — Assessment & Plan Note (Signed)
1.  Right breast infiltrating ductal carcinoma: -Lumpectomy on 06/11/2017, 1.4 cm, 0 out of 2 sentinel lymph nodes positive, margins negative, ER/PR positive, HER-2 negative, Ki-67 of 5% -Oncotype DX recurrence score of 10.  Negative genetic testing for hereditary breast cancer. -Radiation therapy finished in January 2019 -Anastrozole was started on 09/06/2017.  She is tolerating it well with minor hot flashes.  No musculoskeletal symptoms. -Mammogram on 05/14/2019 was BI-RADS Category 2. -Physical exam shows lumpectomy scar in the upper outer quadrant unchanged.  No palpable mass in bilateral breast.  No palpable adenopathy. -She will return back in 6 months with repeat physical exam and labs.   2.  Mild hypercalcemia: -Calcium is 10.4.  Previously she had normal PTH levels.  Vitamin D is 35. -She will continue vitamin D 2000 units daily.  3.  Osteopenia: -DEXA scan at Dr. Trena Platt office on 08/07/2018 shows T score of -1.4. She will continue calcium 1200 mg daily.  4.  Right ovarian cyst and left renal lesion: -Recent pelvic ultrasound on 04/17/2019 shows right ovary entirely occupied by a simple cyst measuring 3.3 x 2.4 x 1.7 cm.  This has benign characteristics.  Ca1 25 level done by Dr. Manuella Ghazi on 05/13/2019 was 6.1. -Ultrasound of the abdomen on 04/17/2019 shows multiple parapelvic renal cysts bilaterally with cortical thinning and increased echogenicity.  Upper pole left renal cyst similar to that visualized on prior CT scan.  Fatty infiltration of the liver present.

## 2019-05-18 NOTE — Progress Notes (Signed)
[facility], [place] [address]   CLINIC:  Medical Oncology/Hematology  PCP:  Monico Blitz, [place] [address] [phone]   REASON FOR VISIT: Follow-up for right breast cancer, ER+/PR+/HER2-  CURRENT THERAPY: Arimidex   BRIEF ONCOLOGIC HISTORY:  Oncology History  Carcinoma of upper-outer quadrant of right breast in female, estrogen receptor positive ([facility])  06/11/2017 Initial Diagnosis   Carcinoma of upper-outer quadrant of right breast in female, estrogen receptor positive ([place])   07/08/2017 Genetic Testing   Negative genetic testing on the Multi-cancer panel.  The Multi-Gene Panel offered by [facility] includes sequencing and/or deletion duplication testing of the following 83 genes: ALK, APC, ATM, AXIN2,BAP1,  BARD1, BLM, BMPR1A, BRCA1, BRCA2, BRIP1, CASR, CDC73, CDH1, CDK4, CDKN1B, CDKN1C, CDKN2A (p14ARF), CDKN2A (p16INK4a), CEBPA, CHEK2, CTNNA1, DICER1, DIS3L2, EGFR (c.2369C>T, p.Thr790Met variant only), EPCAM (Deletion/duplication testing only), FH, FLCN, GATA2, GPC3, GREM1 (Promoter region deletion/duplication testing only), HOXB13 (c.251G>A, p.Gly84Glu), HRAS, KIT, MAX, MEN1, MET, MITF (c.952G>A, p.Glu318Lys variant only), MLH1, MSH2, MSH3, MSH6, MUTYH, NBN, NF1, NF2, NTHL1, PALB2, PDGFRA, PHOX2B, PMS2, POLD1, POLE, POT1, PRKAR1A, PTCH1, PTEN, RAD50, RAD51C, RAD51D, RB1, RECQL4, RET, RUNX1, SDHAF2, SDHA (sequence changes only), SDHB, SDHC, SDHD, SMAD4, SMARCA4, SMARCB1, SMARCE1, STK11, SUFU, TERT, TERT, TMEM127, TP53, TSC1, TSC2, VHL, WRN and WT1.  The report date is July 08, 2017.       CANCER STAGING: Cancer Staging Carcinoma of upper-outer quadrant of right breast in female, estrogen receptor positive ([place]) Staging form: Breast, AJCC 8th Edition - Clinical: cT1c, cN0 - Unsigned - Pathologic: Stage IA (pT1c, pN0, cM0, G1, ER: Positive, PR: Positive, HER2: Negative, Oncotype DX score: 11) - Signed by Twana First, MD on  06/13/2017    INTERVAL HISTORY:  Ms. Dunigan 73 y.o. female seen for follow-up of right breast cancer.  She is tolerating anastrozole very well.  Denies any major hot flashes or musculoskeletal symptoms.  Appetite and energy levels are 100%.  Left lower back pain is 3 out of 10. She was seen in ER for left loin pain started 6 weeks ago. CT scan was done, followed by [place]. Denies hematuria. Rare hot flashes.   REVIEW OF SYSTEMS:  Review of Systems  Endocrine: Positive for hot flashes.  All other systems reviewed and are negative.    PAST MEDICAL/SURGICAL HISTORY:  Past Medical History:  Diagnosis Date  . Anxiety   . Cancer ([place]) 05/2017   right breast cancer  . Family history of breast cancer   . Family history of ovarian cancer   . Family history of stomach cancer   . GERD (gastroesophageal reflux disease)   . High cholesterol   . History of radiation therapy 07/03/17- 07/31/2017   Right Breast/ 40/05 Gy in 15 fractions, Right Breast boost/ 10 Gy in 5 fractions.   . Hypertension   . Personal history of radiation therapy 2018/2019   Past Surgical History:  Procedure Laterality Date  . BREAST BIOPSY Right 05/09/2017   x2  . BREAST LUMPECTOMY Right 05/20/2017  . CARDIAC CATHETERIZATION     2008- negative findings  . CATARACT EXTRACTION W/PHACO Right 09/06/2014   Procedure: CATARACT EXTRACTION PHACO AND INTRAOCULAR LENS PLACEMENT; CDE:  3.85;  Surgeon: Williams Che, MD;  Location: [facility];  Service: Ophthalmology;  Laterality: Right;  . CATARACT EXTRACTION W/PHACO Left 12/13/2014   Procedure: CATARACT EXTRACTION PHACO AND INTRAOCULAR LENS PLACEMENT LEFT EYE CDE=10.79;  Surgeon: Williams Che, MD;  Location: [facility];  Service:  Ophthalmology;  Laterality: Left;  . ESOPHAGOGASTRODUODENOSCOPY N/A 05/05/2014   Procedure: ESOPHAGOGASTRODUODENOSCOPY (EGD);  Surgeon: Rogene Houston, MD;  Location: [facility];  Service: Endoscopy;  Laterality: N/A;  255  . EUS N/A 06/10/2014    Procedure: UPPER ENDOSCOPIC ULTRASOUND (EUS) LINEAR;  Surgeon: Milus Banister, MD;  Location: [facility];  Service: Endoscopy;  Laterality: N/A;  . RADIOACTIVE SEED GUIDED PARTIAL MASTECTOMY WITH AXILLARY SENTINEL LYMPH NODE BIOPSY Right 05/20/2017   Procedure: RIGHT BREAST LUMPECTOMY WITH RADIOACTIVE SEED AND SENTINEL LYMPH NODE BIOPSY;  Surgeon: Rolm Bookbinder, MD;  Location: [facility];  Service: General;  Laterality: Right;  . TUBAL LIGATION       SOCIAL HISTORY:  Social History   Socioeconomic History  . Marital status: Married    Spouse name: Not on file  . Number of children: 2  . Years of education: Not on file  . Highest education level: Not on file  Occupational History  . Occupation: retired  Scientific laboratory technician  . Financial resource strain: Not on file  . Food insecurity    Worry: Not on file    Inability: Not on file  . Transportation needs    Medical: Not on file    Non-medical: Not on file  Tobacco Use  . Smoking status: Never Smoker  . Smokeless tobacco: Never Used  Substance and Sexual Activity  . Alcohol use: No  . Drug use: No  . Sexual activity: Yes    Birth control/protection: Post-menopausal  Lifestyle  . Physical activity    Days per week: Not on file    Minutes per session: Not on file  . Stress: Not on file  Relationships  . Social Herbalist on phone: Not on file    Gets together: Not on file    Attends religious service: Not on file    Active member of club or organization: Not on file    Attends meetings of clubs or organizations: Not on file    Relationship status: Not on file  . Intimate partner violence    Fear of current or ex partner: Not on file    Emotionally abused: Not on file    Physically abused: Not on file    Forced sexual activity: Not on file  Other Topics Concern  . Not on file  Social History Narrative  . Not on file    FAMILY HISTORY:  Family History  Problem Relation Age of Onset  .  CAD Mother 46  . Leukemia Father   . Parkinson's disease Father   . Breast cancer Daughter 58       BRCA neg  . Heart Problems Maternal Aunt   . Cancer Maternal Uncle        NOS  . Cancer Paternal Uncle        NOS  . Diabetes Maternal Grandfather   . Liver cancer Maternal Grandfather   . Cancer Paternal Grandmother        NOS  . Brain cancer Maternal Aunt   . Lung cancer Maternal Aunt        smoker  . Cancer Maternal Uncle        NOS  . Stomach cancer Maternal Uncle   . Lung cancer Maternal Uncle        smoker  . Breast cancer Cousin        mat first cousin  . Breast cancer Cousin        mat first  cousin dx under 51  . Breast cancer Cousin        2 additional mat first cousins  . Ovarian cancer Cousin        mat first cousin  . Cancer Cousin        several female mat first cousins with cancer NOS  . Breast cancer Cousin        2 pat first cousins  . Cancer Cousin        3 pat first cousins with cancer NOS    CURRENT MEDICATIONS:  Outpatient Encounter Medications as of 05/18/2019  Medication Sig Note  . amLODipine (NORVASC) 10 MG tablet Take 10 mg by mouth daily.   Marland Kitchen amoxicillin-clavulanate (AUGMENTIN) 500-125 MG tablet Take 1 tablet by mouth 2 (two) times daily with a meal.   . anastrozole (ARIMIDEX) 1 MG tablet TAKE 1 TABLET DAILY   . aspirin EC 81 MG tablet Take 81 mg by mouth daily.   . cholecalciferol (VITAMIN D) 1000 units tablet Take 2,000 Units by mouth daily.    . ciprofloxacin (CIPRO) 500 MG tablet Take 500 mg by mouth 2 (two) times daily.   Marland Kitchen co-enzyme Q-10 30 MG capsule Take 100 mg by mouth daily.   . diclofenac (VOLTAREN) 75 MG EC tablet    . diclofenac sodium (VOLTAREN) 1 % GEL Apply 2 g topically 4 (four) times daily as needed.   Marland Kitchen losartan-hydrochlorothiazide (HYZAAR) 100-25 MG per tablet Take 1 tablet by mouth every morning.  06/25/2016: Patient was advised to take an additional tablet due to Blood pressure levels  . metoprolol succinate (TOPROL-XL) 25  MG 24 hr tablet Take 25 mg by mouth every morning.    . Omega-3 Fatty Acids (OMEGA 3 500) 500 MG CAPS Take by mouth.   Marland Kitchen omeprazole (PRILOSEC) 20 MG capsule Take 20 mg by mouth daily.   Marland Kitchen oxyCODONE-acetaminophen (PERCOCET/ROXICET) 5-325 MG tablet Take 1 tablet by mouth every 6 (six) hours as needed for severe pain.   . potassium chloride SA (K-DUR) 20 MEQ tablet TAKE 1 TABLET BY MOUTH EVERY DAY   . potassium chloride SA (K-DUR,KLOR-CON) 20 MEQ tablet Take 1 tablet (20 mEq total) by mouth once for 1 dose.   . [DISCONTINUED] tiZANidine (ZANAFLEX) 4 MG tablet Take 4 mg by mouth at bedtime.    No facility-administered encounter medications on file as of 05/18/2019.     ALLERGIES:  Allergies  Allergen Reactions  . Nitrostat [Nitroglycerin] Shortness Of Breath     PHYSICAL EXAM:  ECOG Performance status: 1  Vitals:   05/18/19 0812  BP: 127/62  Pulse: 74  Resp: 18  Temp: (!) 97.4 F (36.3 C)  SpO2: 99%   Filed Weights   05/18/19 0812  Weight: 231 lb 11.2 oz (105.1 kg)    Physical Exam Constitutional:      Appearance: She is well-developed.  Cardiovascular:     Rate and Rhythm: Normal rate and regular rhythm.     Heart sounds: Normal heart sounds.  Pulmonary:     Effort: Pulmonary effort is normal.     Breath sounds: Normal breath sounds.  Abdominal:     General: There is no distension.     Palpations: Abdomen is soft. There is no mass.  Musculoskeletal: Normal range of motion.  Skin:    General: Skin is warm.  Neurological:     General: No focal deficit present.     Mental Status: She is alert and oriented to person, place, and time.  Psychiatric:        Mood and Affect: Mood normal.        Behavior: Behavior normal.   Breast exam: No palpable mass in bilateral breast.  Lumpectomy scar in the upper outer quadrant of the right breast is unchanged.  No palpable adenopathy.   LABORATORY DATA:  I have reviewed the labs as listed.  CBC    Component Value Date/Time    WBC 6.4 05/13/2019 0826   RBC 4.68 05/13/2019 0826   HGB 14.3 05/13/2019 0826   HCT 44.2 05/13/2019 0826   PLT 243 05/13/2019 0826   MCV 94.4 05/13/2019 0826   MCH 30.6 05/13/2019 0826   MCHC 32.4 05/13/2019 0826   RDW 12.0 05/13/2019 0826   LYMPHSABS 1.5 05/13/2019 0826   MONOABS 0.4 05/13/2019 0826   EOSABS 0.2 05/13/2019 0826   BASOSABS 0.0 05/13/2019 0826   CMP Latest Ref Rng & Units 05/13/2019 12/18/2018 06/10/2018  Glucose 70 - 99 mg/dL 94 118(H) 95  BUN 8 - 23 mg/dL 21 22 22   Creatinine 0.44 - 1.00 mg/dL 0.91 0.97 1.01(H)  Sodium 135 - 145 mmol/L 137 140 139  Potassium 3.5 - 5.1 mmol/L 3.8 3.7 2.8(L)  Chloride 98 - 111 mmol/L 101 104 106  CO2 22 - 32 mmol/L 25 25 26   Calcium 8.9 - 10.3 mg/dL 10.4(H) 10.0 9.5  Total Protein 6.5 - 8.1 g/dL 7.1 6.9 7.2  Total Bilirubin 0.3 - 1.2 mg/dL 0.9 0.6 0.3  Alkaline Phos 38 - 126 U/L 101 112 106  AST 15 - 41 U/L 15 16 19   ALT 0 - 44 U/L 15 16 17        DIAGNOSTIC IMAGING:  I have independently reviewed the scans and discussed with the patient.      ASSESSMENT & PLAN:   Carcinoma of upper-outer quadrant of right breast in female, estrogen receptor positive ([place]) 1.  Right breast infiltrating ductal carcinoma: -Lumpectomy on 06/11/2017, 1.4 cm, 0 out of 2 sentinel lymph nodes positive, margins negative, ER/PR positive, HER-2 negative, Ki-67 of 5% -Oncotype DX recurrence score of 10.  Negative genetic testing for hereditary breast cancer. -Radiation therapy finished in January 2019 -Anastrozole was started on 09/06/2017.  She is tolerating it well with minor hot flashes.  No musculoskeletal symptoms. -Mammogram on 05/14/2019 was BI-RADS Category 2. -Physical exam shows lumpectomy scar in the upper outer quadrant unchanged.  No palpable mass in bilateral breast.  No palpable adenopathy. -She will return back in 6 months with repeat physical exam and labs.   2.  Mild hypercalcemia: -Calcium is 10.4.  Previously she had normal PTH  levels.  Vitamin D is 35. -She will continue vitamin D 2000 units daily.  3.  Osteopenia: -DEXA scan at Dr. Trena Platt office on 08/07/2018 shows T score of -1.4. She will continue calcium 1200 mg daily.  4.  Right ovarian cyst and left renal lesion: -Recent pelvic ultrasound on 04/17/2019 shows right ovary entirely occupied by a simple cyst measuring 3.3 x 2.4 x 1.7 cm.  This has benign characteristics.  Ca1 25 level done by Dr. Manuella Ghazi on 05/13/2019 was 6.1. -Ultrasound of the abdomen on 04/17/2019 shows multiple parapelvic renal cysts bilaterally with cortical thinning and increased echogenicity.  Upper pole left renal cyst similar to that visualized on prior CT scan.  Fatty infiltration of the liver present.      Orders placed this encounter:  Orders Placed This Encounter  Procedures  . CBC with Differential/Platelet  . Comprehensive metabolic  panel  . Cancer antigen 15-3  . Vitamin D 25 hydroxy      Derek Jack, MD [place] [phone]

## 2019-05-18 NOTE — Patient Instructions (Addendum)
[facility] at [facility] Discharge Instructions  You were seen today by Dr. Katragadda. He went over your recent lab results. He will see you back in 6 months for labs and follow up.   Thank you for choosing [facility] at [facility] to provide your oncology and hematology care.  To afford each patient quality time with our provider, please arrive at least 15 minutes before your scheduled appointment time.   If you have a lab appointment with the Cancer Center please come in thru the  Main Entrance and check in at the main information desk  You need to re-schedule your appointment should you arrive 10 or more minutes late.  We strive to give you quality time with our providers, and arriving late affects you and other patients whose appointments are after yours.  Also, if you no show three or more times for appointments you may be dismissed from the clinic at the providers discretion.     Again, thank you for choosing [facility].  Our hope is that these requests will decrease the amount of time that you wait before being seen by our physicians.       _____________________________________________________________  Should you have questions after your visit to [facility], please contact our office at [phone] between the hours of 8:00 a.m. and 4:30 p.m.  Voicemails left after 4:00 p.m. will not be returned until the following business day.  For prescription refill requests, have your pharmacy contact our office and allow 72 hours.    Cancer Center Support Programs:   > Cancer Support Group  2nd Tuesday of the month 1pm-2pm, Journey Room    

## 2019-06-18 DIAGNOSIS — E7800 Pure hypercholesterolemia, unspecified: Secondary | ICD-10-CM | POA: Diagnosis not present

## 2019-06-18 DIAGNOSIS — I10 Essential (primary) hypertension: Secondary | ICD-10-CM | POA: Diagnosis not present

## 2019-07-16 DIAGNOSIS — I10 Essential (primary) hypertension: Secondary | ICD-10-CM | POA: Diagnosis not present

## 2019-07-16 DIAGNOSIS — E7800 Pure hypercholesterolemia, unspecified: Secondary | ICD-10-CM | POA: Diagnosis not present

## 2019-07-20 DIAGNOSIS — Z7189 Other specified counseling: Secondary | ICD-10-CM | POA: Diagnosis not present

## 2019-07-20 DIAGNOSIS — Z1331 Encounter for screening for depression: Secondary | ICD-10-CM | POA: Diagnosis not present

## 2019-07-20 DIAGNOSIS — E7800 Pure hypercholesterolemia, unspecified: Secondary | ICD-10-CM | POA: Diagnosis not present

## 2019-07-20 DIAGNOSIS — Z1211 Encounter for screening for malignant neoplasm of colon: Secondary | ICD-10-CM | POA: Diagnosis not present

## 2019-07-20 DIAGNOSIS — E559 Vitamin D deficiency, unspecified: Secondary | ICD-10-CM | POA: Diagnosis not present

## 2019-07-20 DIAGNOSIS — Z0000 Encounter for general adult medical examination without abnormal findings: Secondary | ICD-10-CM | POA: Diagnosis not present

## 2019-07-20 DIAGNOSIS — Z1339 Encounter for screening examination for other mental health and behavioral disorders: Secondary | ICD-10-CM | POA: Diagnosis not present

## 2019-07-20 DIAGNOSIS — Z6837 Body mass index (BMI) 37.0-37.9, adult: Secondary | ICD-10-CM | POA: Diagnosis not present

## 2019-07-20 DIAGNOSIS — R5383 Other fatigue: Secondary | ICD-10-CM | POA: Diagnosis not present

## 2019-07-20 DIAGNOSIS — Z299 Encounter for prophylactic measures, unspecified: Secondary | ICD-10-CM | POA: Diagnosis not present

## 2019-07-29 DIAGNOSIS — Z23 Encounter for immunization: Secondary | ICD-10-CM | POA: Diagnosis not present

## 2019-08-25 DIAGNOSIS — Z23 Encounter for immunization: Secondary | ICD-10-CM | POA: Diagnosis not present

## 2019-10-08 DIAGNOSIS — I10 Essential (primary) hypertension: Secondary | ICD-10-CM | POA: Diagnosis not present

## 2019-10-08 DIAGNOSIS — E7800 Pure hypercholesterolemia, unspecified: Secondary | ICD-10-CM | POA: Diagnosis not present

## 2019-11-10 ENCOUNTER — Inpatient Hospital Stay (HOSPITAL_COMMUNITY): Payer: Medicare Other | Attending: Hematology

## 2019-11-10 ENCOUNTER — Other Ambulatory Visit: Payer: Self-pay

## 2019-11-10 ENCOUNTER — Other Ambulatory Visit (HOSPITAL_COMMUNITY): Payer: Medicare Other

## 2019-11-10 DIAGNOSIS — N83201 Unspecified ovarian cyst, right side: Secondary | ICD-10-CM | POA: Insufficient documentation

## 2019-11-10 DIAGNOSIS — M8580 Other specified disorders of bone density and structure, unspecified site: Secondary | ICD-10-CM | POA: Diagnosis not present

## 2019-11-10 DIAGNOSIS — Z170 Estrogen receptor positive status [ER+]: Secondary | ICD-10-CM | POA: Diagnosis not present

## 2019-11-10 DIAGNOSIS — C50411 Malignant neoplasm of upper-outer quadrant of right female breast: Secondary | ICD-10-CM | POA: Insufficient documentation

## 2019-11-10 LAB — CBC WITH DIFFERENTIAL/PLATELET
Abs Immature Granulocytes: 0.02 10*3/uL (ref 0.00–0.07)
Basophils Absolute: 0 10*3/uL (ref 0.0–0.1)
Basophils Relative: 0 %
Eosinophils Absolute: 0.2 10*3/uL (ref 0.0–0.5)
Eosinophils Relative: 3 %
HCT: 45.7 % (ref 36.0–46.0)
Hemoglobin: 14.5 g/dL (ref 12.0–15.0)
Immature Granulocytes: 0 %
Lymphocytes Relative: 24 %
Lymphs Abs: 1.5 10*3/uL (ref 0.7–4.0)
MCH: 30.1 pg (ref 26.0–34.0)
MCHC: 31.7 g/dL (ref 30.0–36.0)
MCV: 94.8 fL (ref 80.0–100.0)
Monocytes Absolute: 0.5 10*3/uL (ref 0.1–1.0)
Monocytes Relative: 7 %
Neutro Abs: 4.1 10*3/uL (ref 1.7–7.7)
Neutrophils Relative %: 66 %
Platelets: 247 10*3/uL (ref 150–400)
RBC: 4.82 MIL/uL (ref 3.87–5.11)
RDW: 11.9 % (ref 11.5–15.5)
WBC: 6.2 10*3/uL (ref 4.0–10.5)
nRBC: 0 % (ref 0.0–0.2)

## 2019-11-10 LAB — COMPREHENSIVE METABOLIC PANEL
ALT: 18 U/L (ref 0–44)
AST: 17 U/L (ref 15–41)
Albumin: 4 g/dL (ref 3.5–5.0)
Alkaline Phosphatase: 109 U/L (ref 38–126)
Anion gap: 11 (ref 5–15)
BUN: 24 mg/dL — ABNORMAL HIGH (ref 8–23)
CO2: 27 mmol/L (ref 22–32)
Calcium: 10.2 mg/dL (ref 8.9–10.3)
Chloride: 101 mmol/L (ref 98–111)
Creatinine, Ser: 0.96 mg/dL (ref 0.44–1.00)
GFR calc Af Amer: >60 mL/min (ref >60)
GFR calc non Af Amer: 59 mL/min — ABNORMAL LOW (ref >60)
Glucose, Bld: 100 mg/dL — ABNORMAL HIGH (ref 70–99)
Potassium: 4.2 mmol/L (ref 3.5–5.1)
Sodium: 139 mmol/L (ref 135–145)
Total Bilirubin: 0.7 mg/dL (ref 0.3–1.2)
Total Protein: 7.3 g/dL (ref 6.5–8.1)

## 2019-11-10 LAB — VITAMIN D 25 HYDROXY (VIT D DEFICIENCY, FRACTURES): Vit D, 25-Hydroxy: 24.93 ng/mL — ABNORMAL LOW (ref 30–100)

## 2019-11-11 LAB — CANCER ANTIGEN 15-3: CA 15-3: 7.1 U/mL (ref 0.0–25.0)

## 2019-11-17 ENCOUNTER — Other Ambulatory Visit: Payer: Self-pay

## 2019-11-17 ENCOUNTER — Encounter (HOSPITAL_COMMUNITY): Payer: Self-pay | Admitting: Hematology

## 2019-11-17 ENCOUNTER — Inpatient Hospital Stay (HOSPITAL_BASED_OUTPATIENT_CLINIC_OR_DEPARTMENT_OTHER): Payer: Medicare Other | Admitting: Hematology

## 2019-11-17 DIAGNOSIS — C50411 Malignant neoplasm of upper-outer quadrant of right female breast: Secondary | ICD-10-CM

## 2019-11-17 DIAGNOSIS — Z170 Estrogen receptor positive status [ER+]: Secondary | ICD-10-CM

## 2019-11-17 DIAGNOSIS — N83201 Unspecified ovarian cyst, right side: Secondary | ICD-10-CM | POA: Diagnosis not present

## 2019-11-17 DIAGNOSIS — M8580 Other specified disorders of bone density and structure, unspecified site: Secondary | ICD-10-CM | POA: Diagnosis not present

## 2019-11-17 MED ORDER — ANASTROZOLE 1 MG PO TABS: 1.0000 mg | ORAL_TABLET | Freq: Every day | ORAL | 3 refills | Status: DC

## 2019-11-17 NOTE — Progress Notes (Signed)
[place], [place] [address]   CLINIC:  Medical Oncology/Hematology  PCP:  Monico Blitz, [place] [address] [phone]   REASON FOR VISIT: Follow-up for right breast cancer, ER+/PR+/HER2-  CURRENT THERAPY: Arimidex   BRIEF ONCOLOGIC HISTORY:  Oncology History  Carcinoma of upper-outer quadrant of right breast in female, estrogen receptor positive ([place])  06/11/2017 Initial Diagnosis   Carcinoma of upper-outer quadrant of right breast in female, estrogen receptor positive ([place])   07/08/2017 Genetic Testing   Negative genetic testing on the Multi-cancer panel.  The Multi-Gene Panel offered by [facility] includes sequencing and/or deletion duplication testing of the following 83 genes: ALK, APC, ATM, AXIN2,BAP1,  BARD1, BLM, BMPR1A, BRCA1, BRCA2, BRIP1, CASR, CDC73, CDH1, CDK4, CDKN1B, CDKN1C, CDKN2A (p14ARF), CDKN2A (p16INK4a), CEBPA, CHEK2, CTNNA1, DICER1, DIS3L2, EGFR (c.2369C>T, p.Thr790Met variant only), EPCAM (Deletion/duplication testing only), FH, FLCN, GATA2, GPC3, GREM1 (Promoter region deletion/duplication testing only), HOXB13 (c.251G>A, p.Gly84Glu), HRAS, KIT, MAX, MEN1, MET, MITF (c.952G>A, p.Glu318Lys variant only), MLH1, MSH2, MSH3, MSH6, MUTYH, NBN, NF1, NF2, NTHL1, PALB2, PDGFRA, PHOX2B, PMS2, POLD1, POLE, POT1, PRKAR1A, PTCH1, PTEN, RAD50, RAD51C, RAD51D, RB1, RECQL4, RET, RUNX1, SDHAF2, SDHA (sequence changes only), SDHB, SDHC, SDHD, SMAD4, SMARCA4, SMARCB1, SMARCE1, STK11, SUFU, TERT, TERT, TMEM127, TP53, TSC1, TSC2, VHL, WRN and WT1.  The report date is July 08, 2017.       CANCER STAGING: Cancer Staging Carcinoma of upper-outer quadrant of right breast in female, estrogen receptor positive ([place]) Staging form: Breast, AJCC 8th Edition - Clinical: cT1c, cN0 - Unsigned - Pathologic: Stage IA (pT1c, pN0, cM0, G1, ER: Positive, PR: Positive, HER2: Negative, Oncotype DX score: 11) - Signed by Twana First, MD on  06/13/2017    INTERVAL HISTORY:  Marissa [name] 74 y.o. female seen for follow-up of right breast cancer.  Tolerating anastrozole very well.  Reports appetite of 100%.  Energy levels are 50%.  Chronic knee pains and back pain is stable.  Has occasional minor hot flashes.  No new onset pains reported.  Requests refill for anastrozole.  Reports taking vitamin D daily.   REVIEW OF SYSTEMS:  Review of Systems  Endocrine: Positive for hot flashes.  All other systems reviewed and are negative.    PAST MEDICAL/SURGICAL HISTORY:  Past Medical History:  Diagnosis Date  . Anxiety   . Cancer ([place]) 05/2017   right breast cancer  . Family history of breast cancer   . Family history of ovarian cancer   . Family history of stomach cancer   . GERD (gastroesophageal reflux disease)   . High cholesterol   . History of radiation therapy 07/03/17- 07/31/2017   Right Breast/ 40/05 Gy in 15 fractions, Right Breast boost/ 10 Gy in 5 fractions.   . Hypertension   . Personal history of radiation therapy 2018/2019   Past Surgical History:  Procedure Laterality Date  . BREAST BIOPSY Right 05/09/2017   x2  . BREAST LUMPECTOMY Right 05/20/2017  . CARDIAC CATHETERIZATION     2008- negative findings  . CATARACT EXTRACTION W/PHACO Right 09/06/2014   Procedure: CATARACT EXTRACTION PHACO AND INTRAOCULAR LENS PLACEMENT; CDE:  3.85;  Surgeon: Williams Che, MD;  Location: [facility];  Service: Ophthalmology;  Laterality: Right;  . CATARACT EXTRACTION W/PHACO Left 12/13/2014   Procedure: CATARACT EXTRACTION PHACO AND INTRAOCULAR LENS PLACEMENT LEFT EYE CDE=10.79;  Surgeon: Williams Che, MD;  Location: [facility];  Service: Ophthalmology;  Laterality: Left;  . ESOPHAGOGASTRODUODENOSCOPY N/A 05/05/2014  Procedure: ESOPHAGOGASTRODUODENOSCOPY (EGD);  Surgeon: Rogene Houston, MD;  Location: [facility];  Service: Endoscopy;  Laterality: N/A;  255  . EUS N/A 06/10/2014   Procedure: UPPER ENDOSCOPIC ULTRASOUND (EUS)  LINEAR;  Surgeon: Milus Banister, MD;  Location: [facility];  Service: Endoscopy;  Laterality: N/A;  . RADIOACTIVE SEED GUIDED PARTIAL MASTECTOMY WITH AXILLARY SENTINEL LYMPH NODE BIOPSY Right 05/20/2017   Procedure: RIGHT BREAST LUMPECTOMY WITH RADIOACTIVE SEED AND SENTINEL LYMPH NODE BIOPSY;  Surgeon: Rolm Bookbinder, MD;  Location: [facility];  Service: General;  Laterality: Right;  . TUBAL LIGATION       SOCIAL HISTORY:  Social History   Socioeconomic History  . Marital status: Married    Spouse name: Not on file  . Number of children: 2  . Years of education: Not on file  . Highest education level: Not on file  Occupational History  . Occupation: retired  Tobacco Use  . Smoking status: Never Smoker  . Smokeless tobacco: Never Used  Substance and Sexual Activity  . Alcohol use: No  . Drug use: No  . Sexual activity: Yes    Birth control/protection: Post-menopausal  Other Topics Concern  . Not on file  Social History Narrative  . Not on file   Social Determinants of Health   Financial Resource Strain:   . Difficulty of Paying Living Expenses:   Food Insecurity:   . Worried About Charity fundraiser in the Last Year:   . Arboriculturist in the Last Year:   Transportation Needs:   . Film/video editor (Medical):   Marland Kitchen Lack of Transportation (Non-Medical):   Physical Activity:   . Days of Exercise per Week:   . Minutes of Exercise per Session:   Stress:   . Feeling of Stress :   Social Connections:   . Frequency of Communication with Friends and Family:   . Frequency of Social Gatherings with Friends and Family:   . Attends Religious Services:   . Active Member of Clubs or Organizations:   . Attends Archivist Meetings:   Marland Kitchen Marital Status:   Intimate Partner Violence:   . Fear of Current or Ex-Partner:   . Emotionally Abused:   Marland Kitchen Physically Abused:   . Sexually Abused:     FAMILY HISTORY:  Family History  Problem Relation  Age of Onset  . CAD Mother 19  . Leukemia Father   . Parkinson's disease Father   . Breast cancer Daughter 58       BRCA neg  . Heart Problems Maternal Aunt   . Cancer Maternal Uncle        NOS  . Cancer Paternal Uncle        NOS  . Diabetes Maternal Grandfather   . Liver cancer Maternal Grandfather   . Cancer Paternal Grandmother        NOS  . Brain cancer Maternal Aunt   . Lung cancer Maternal Aunt        smoker  . Cancer Maternal Uncle        NOS  . Stomach cancer Maternal Uncle   . Lung cancer Maternal Uncle        smoker  . Breast cancer Cousin        mat first cousin  . Breast cancer Cousin        mat first cousin dx under 75  . Breast cancer Cousin        2  additional mat first cousins  . Ovarian cancer Cousin        mat first cousin  . Cancer Cousin        several female mat first cousins with cancer NOS  . Breast cancer Cousin        2 pat first cousins  . Cancer Cousin        3 pat first cousins with cancer NOS    CURRENT MEDICATIONS:  Outpatient Encounter Medications as of 11/17/2019  Medication Sig Note  . amLODipine (NORVASC) 10 MG tablet Take 10 mg by mouth daily.   Marland Kitchen anastrozole (ARIMIDEX) 1 MG tablet Take 1 tablet (1 mg total) by mouth daily.   Marland Kitchen aspirin EC 81 MG tablet Take 81 mg by mouth daily.   . cholecalciferol (VITAMIN D) 1000 units tablet Take 2,000 Units by mouth daily.    Marland Kitchen co-enzyme Q-10 30 MG capsule Take 100 mg by mouth daily.   Marland Kitchen losartan-hydrochlorothiazide (HYZAAR) 100-25 MG per tablet Take 1 tablet by mouth every morning.  06/25/2016: Patient was advised to take an additional tablet due to Blood pressure levels  . metoprolol succinate (TOPROL-XL) 25 MG 24 hr tablet Take 25 mg by mouth every morning.    . Omega-3 Fatty Acids (OMEGA 3 500) 500 MG CAPS Take by mouth.   Marland Kitchen omeprazole (PRILOSEC) 20 MG capsule Take 20 mg by mouth daily.   . potassium chloride SA (K-DUR) 20 MEQ tablet TAKE 1 TABLET BY MOUTH EVERY DAY   . [DISCONTINUED]  anastrozole (ARIMIDEX) 1 MG tablet TAKE 1 TABLET DAILY   . diclofenac sodium (VOLTAREN) 1 % GEL Apply 2 g topically 4 (four) times daily as needed. (Patient not taking: Reported on 11/17/2019)   . [DISCONTINUED] amoxicillin-clavulanate (AUGMENTIN) 500-125 MG tablet Take 1 tablet by mouth 2 (two) times daily with a meal.   . [DISCONTINUED] ciprofloxacin (CIPRO) 500 MG tablet Take 500 mg by mouth 2 (two) times daily.   . [DISCONTINUED] diclofenac (VOLTAREN) 75 MG EC tablet    . [DISCONTINUED] oxyCODONE-acetaminophen (PERCOCET/ROXICET) 5-325 MG tablet Take 1 tablet by mouth every 6 (six) hours as needed for severe pain. (Patient not taking: Reported on 11/17/2019)   . [DISCONTINUED] potassium chloride SA (K-DUR,KLOR-CON) 20 MEQ tablet Take 1 tablet (20 mEq total) by mouth once for 1 dose.    No facility-administered encounter medications on file as of 11/17/2019.    ALLERGIES:  Allergies  Allergen Reactions  . Nitrostat [Nitroglycerin] Shortness Of Breath     PHYSICAL EXAM:  ECOG Performance status: 1  Vitals:   11/17/19 0801  BP: (!) 150/69  Pulse: 68  Resp: 19  Temp: (!) 97.1 F (36.2 C)  SpO2: 99%   Filed Weights   11/17/19 0801  Weight: 235 lb (106.6 kg)    Physical Exam Constitutional:      Appearance: She is well-developed.  Cardiovascular:     Rate and Rhythm: Normal rate and regular rhythm.     Heart sounds: Normal heart sounds.  Pulmonary:     Effort: Pulmonary effort is normal.     Breath sounds: Normal breath sounds.  Abdominal:     General: There is no distension.     Palpations: Abdomen is soft. There is no mass.  Musculoskeletal:        General: Normal range of motion.  Skin:    General: Skin is warm.  Neurological:     General: No focal deficit present.     Mental Status: She is  alert and oriented to person, place, and time.  Psychiatric:        Mood and Affect: Mood normal.        Behavior: Behavior normal.   No palpable masses in bilateral breast.   No palpable axillary adenopathy.  LABORATORY DATA:  I have reviewed the labs as listed.  CBC    Component Value Date/Time   WBC 6.2 11/10/2019 0803   RBC 4.82 11/10/2019 0803   HGB 14.5 11/10/2019 0803   HCT 45.7 11/10/2019 0803   PLT 247 11/10/2019 0803   MCV 94.8 11/10/2019 0803   MCH 30.1 11/10/2019 0803   MCHC 31.7 11/10/2019 0803   RDW 11.9 11/10/2019 0803   LYMPHSABS 1.5 11/10/2019 0803   MONOABS 0.5 11/10/2019 0803   EOSABS 0.2 11/10/2019 0803   BASOSABS 0.0 11/10/2019 0803   CMP Latest Ref Rng & Units 11/10/2019 05/13/2019 12/18/2018  Glucose 70 - 99 mg/dL 100(H) 94 118(H)  BUN 8 - 23 mg/dL 24(H) 21 22  Creatinine 0.44 - 1.00 mg/dL 0.96 0.91 0.97  Sodium 135 - 145 mmol/L 139 137 140  Potassium 3.5 - 5.1 mmol/L 4.2 3.8 3.7  Chloride 98 - 111 mmol/L 101 101 104  CO2 22 - 32 mmol/L '27 25 25  '$ Calcium 8.9 - 10.3 mg/dL 10.2 10.4(H) 10.0  Total Protein 6.5 - 8.1 g/dL 7.3 7.1 6.9  Total Bilirubin 0.3 - 1.2 mg/dL 0.7 0.9 0.6  Alkaline Phos 38 - 126 U/L 109 101 112  AST 15 - 41 U/L '17 15 16  '$ ALT 0 - 44 U/L '18 15 16       '$ DIAGNOSTIC IMAGING:  I have reviewed scans.      ASSESSMENT & PLAN:   Carcinoma of upper-outer quadrant of right breast in female, estrogen receptor positive ([place]) 1.  Right breast infiltrating ductal carcinoma, ER/PR positive HER-2 negative: -Lumpectomy on 06/11/2017, 1.4 cm, 0/2 sentinel lymph nodes positive, margins negative, ER/PR positive, HER-2 negative, Ki-67 5%. -Oncotype DX recurrence score of 10.  Negative genetic testing for hereditary breast cancer. -Radiation therapy finished in January 2019.  Anastrozole started on 09/06/2017. -We have reviewed mammogram from 05/14/2019 which was BI-RADS Category 2. -Physical exam today shows lumpectomy scar in the upper outer quadrant is unchanged with no palpable masses no palpable adenopathy. -We reviewed her labs.  She will come back in 6 months with repeat labs and mammogram.  2.  Osteopenia: -DEXA  scan at Dr. Trena Platt office on 08/07/2018 shows T score -1.4. -She will continue calcium 1200 mg daily and vitamin D 2000 units daily.  Vitamin D level was 25.  We will repeat it again at next visit. -We will plan to repeat DEXA scan in 2 to 3 years from the last.  3.  Right ovarian cyst and left renal lesion: -Recent pelvic ultrasound on 04/17/2019 shows right ovary entirely occupied by a simple cyst measuring 3.3 x 2.4 x 1.7 cm.  This has benign characteristics.  CA-125 done by Dr. Manuella Ghazi on 05/13/2019 was 6.1. -Ultrasound of the abdomen on 04/17/2019 shows multiple parapelvic renal cysts bilaterally with cortical thinning and increased echogenicity.  Upper pole left renal cyst similar to that visualized on prior CT scan.  Fatty infiltration of the liver present.    Orders placed this encounter:  Orders Placed This Encounter  Procedures  . MM DIAG BREAST TOMO BILATERAL  . CBC with Differential  . Comprehensive metabolic panel  . Cancer antigen 15-3  . Vitamin D 25 hydroxy  Derek Jack, MD [facility] [phone]

## 2019-11-17 NOTE — Patient Instructions (Signed)
[place] at [facility] Discharge Instructions  You were seen today by Dr. Delton Coombes. He went over your recent lab results. He will see you back in 6 months for labs, mammogram and follow up.   Thank you for choosing [place] at [facility] to provide your oncology and hematology care.  To afford each patient quality time with our provider, please arrive at least 15 minutes before your scheduled appointment time.   If you have a lab appointment with the [facility] please come in thru the  Main Entrance and check in at the main information desk  You need to re-schedule your appointment should you arrive 10 or more minutes late.  We strive to give you quality time with our providers, and arriving late affects you and other patients whose appointments are after yours.  Also, if you no show three or more times for appointments you may be dismissed from the clinic at the providers discretion.     Again, thank you for choosing [facility].  Our hope is that these requests will decrease the amount of time that you wait before being seen by our physicians.       _____________________________________________________________  Should you have questions after your visit to [facility], please contact our office at [phone] between the hours of 8:00 a.m. and 4:30 p.m.  Voicemails left after 4:00 p.m. will not be returned until the following business day.  For prescription refill requests, have your pharmacy contact our office and allow 72 hours.    Cancer Center Support Programs:   > Cancer Support Group  2nd Tuesday of the month 1pm-2pm, Journey Room

## 2019-11-17 NOTE — Assessment & Plan Note (Signed)
1.  Right breast infiltrating ductal carcinoma, ER/PR positive HER-2 negative: -Lumpectomy on 06/11/2017, 1.4 cm, 0/2 sentinel lymph nodes positive, margins negative, ER/PR positive, HER-2 negative, Ki-67 5%. -Oncotype DX recurrence score of 10.  Negative genetic testing for hereditary breast cancer. -Radiation therapy finished in January 2019.  Anastrozole started on 09/06/2017. -We have reviewed mammogram from 05/14/2019 which was BI-RADS Category 2. -Physical exam today shows lumpectomy scar in the upper outer quadrant is unchanged with no palpable masses no palpable adenopathy. -We reviewed her labs.  She will come back in 6 months with repeat labs and mammogram.  2.  Osteopenia: -DEXA scan at Dr. Trena Platt office on 08/07/2018 shows T score -1.4. -She will continue calcium 1200 mg daily and vitamin D 2000 units daily.  Vitamin D level was 25.  We will repeat it again at next visit. -We will plan to repeat DEXA scan in 2 to 3 years from the last.  3.  Right ovarian cyst and left renal lesion: -Recent pelvic ultrasound on 04/17/2019 shows right ovary entirely occupied by a simple cyst measuring 3.3 x 2.4 x 1.7 cm.  This has benign characteristics.  CA-125 done by Dr. Manuella Ghazi on 05/13/2019 was 6.1. -Ultrasound of the abdomen on 04/17/2019 shows multiple parapelvic renal cysts bilaterally with cortical thinning and increased echogenicity.  Upper pole left renal cyst similar to that visualized on prior CT scan.  Fatty infiltration of the liver present.

## 2019-12-06 DIAGNOSIS — E7800 Pure hypercholesterolemia, unspecified: Secondary | ICD-10-CM | POA: Diagnosis not present

## 2019-12-06 DIAGNOSIS — I10 Essential (primary) hypertension: Secondary | ICD-10-CM | POA: Diagnosis not present

## 2020-01-06 DIAGNOSIS — E7800 Pure hypercholesterolemia, unspecified: Secondary | ICD-10-CM | POA: Diagnosis not present

## 2020-01-06 DIAGNOSIS — I10 Essential (primary) hypertension: Secondary | ICD-10-CM | POA: Diagnosis not present

## 2020-01-18 DIAGNOSIS — Z299 Encounter for prophylactic measures, unspecified: Secondary | ICD-10-CM | POA: Diagnosis not present

## 2020-01-18 DIAGNOSIS — E7800 Pure hypercholesterolemia, unspecified: Secondary | ICD-10-CM | POA: Diagnosis not present

## 2020-01-18 DIAGNOSIS — E559 Vitamin D deficiency, unspecified: Secondary | ICD-10-CM | POA: Diagnosis not present

## 2020-01-18 DIAGNOSIS — I10 Essential (primary) hypertension: Secondary | ICD-10-CM | POA: Diagnosis not present

## 2020-01-18 DIAGNOSIS — M549 Dorsalgia, unspecified: Secondary | ICD-10-CM | POA: Diagnosis not present

## 2020-01-18 DIAGNOSIS — Z789 Other specified health status: Secondary | ICD-10-CM | POA: Diagnosis not present

## 2020-01-18 DIAGNOSIS — Z6837 Body mass index (BMI) 37.0-37.9, adult: Secondary | ICD-10-CM | POA: Diagnosis not present

## 2020-02-05 DIAGNOSIS — I10 Essential (primary) hypertension: Secondary | ICD-10-CM | POA: Diagnosis not present

## 2020-02-05 DIAGNOSIS — E7800 Pure hypercholesterolemia, unspecified: Secondary | ICD-10-CM | POA: Diagnosis not present

## 2020-02-10 DIAGNOSIS — I10 Essential (primary) hypertension: Secondary | ICD-10-CM | POA: Diagnosis not present

## 2020-02-10 DIAGNOSIS — E7800 Pure hypercholesterolemia, unspecified: Secondary | ICD-10-CM | POA: Diagnosis not present

## 2020-02-28 DIAGNOSIS — Z23 Encounter for immunization: Secondary | ICD-10-CM | POA: Diagnosis not present

## 2020-03-03 DIAGNOSIS — H02844 Edema of left upper eyelid: Secondary | ICD-10-CM | POA: Diagnosis not present

## 2020-03-22 ENCOUNTER — Other Ambulatory Visit: Payer: Self-pay

## 2020-03-22 ENCOUNTER — Other Ambulatory Visit (HOSPITAL_COMMUNITY): Payer: Self-pay | Admitting: Nurse Practitioner

## 2020-03-22 ENCOUNTER — Ambulatory Visit (HOSPITAL_COMMUNITY)
Admission: RE | Admit: 2020-03-22 | Discharge: 2020-03-22 | Disposition: A | Discharge status: Home / Self Care | Payer: Medicare Other | Source: Ambulatory Visit | Attending: Nurse Practitioner | Admitting: Nurse Practitioner

## 2020-03-22 DIAGNOSIS — R52 Pain, unspecified: Secondary | ICD-10-CM | POA: Insufficient documentation

## 2020-03-22 DIAGNOSIS — M79605 Pain in left leg: Secondary | ICD-10-CM | POA: Diagnosis not present

## 2020-03-22 DIAGNOSIS — M7918 Myalgia, other site: Secondary | ICD-10-CM | POA: Diagnosis not present

## 2020-03-22 DIAGNOSIS — N183 Chronic kidney disease, stage 3 unspecified: Secondary | ICD-10-CM | POA: Diagnosis not present

## 2020-03-22 DIAGNOSIS — M25552 Pain in left hip: Secondary | ICD-10-CM | POA: Diagnosis not present

## 2020-03-22 DIAGNOSIS — M79606 Pain in leg, unspecified: Secondary | ICD-10-CM | POA: Insufficient documentation

## 2020-03-22 DIAGNOSIS — I10 Essential (primary) hypertension: Secondary | ICD-10-CM | POA: Diagnosis not present

## 2020-03-22 DIAGNOSIS — Z299 Encounter for prophylactic measures, unspecified: Secondary | ICD-10-CM | POA: Diagnosis not present

## 2020-03-22 DIAGNOSIS — Z6837 Body mass index (BMI) 37.0-37.9, adult: Secondary | ICD-10-CM | POA: Diagnosis not present

## 2020-03-22 DIAGNOSIS — M545 Low back pain: Secondary | ICD-10-CM | POA: Diagnosis not present

## 2020-04-04 DIAGNOSIS — H43813 Vitreous degeneration, bilateral: Secondary | ICD-10-CM | POA: Diagnosis not present

## 2020-04-07 DIAGNOSIS — E7800 Pure hypercholesterolemia, unspecified: Secondary | ICD-10-CM | POA: Diagnosis not present

## 2020-04-07 DIAGNOSIS — I10 Essential (primary) hypertension: Secondary | ICD-10-CM | POA: Diagnosis not present

## 2020-04-19 DIAGNOSIS — Z23 Encounter for immunization: Secondary | ICD-10-CM | POA: Diagnosis not present

## 2020-04-20 DIAGNOSIS — Z299 Encounter for prophylactic measures, unspecified: Secondary | ICD-10-CM | POA: Diagnosis not present

## 2020-04-20 DIAGNOSIS — Z6837 Body mass index (BMI) 37.0-37.9, adult: Secondary | ICD-10-CM | POA: Diagnosis not present

## 2020-04-20 DIAGNOSIS — M79606 Pain in leg, unspecified: Secondary | ICD-10-CM | POA: Diagnosis not present

## 2020-04-20 DIAGNOSIS — I10 Essential (primary) hypertension: Secondary | ICD-10-CM | POA: Diagnosis not present

## 2020-04-20 DIAGNOSIS — M791 Myalgia, unspecified site: Secondary | ICD-10-CM | POA: Diagnosis not present

## 2020-04-20 DIAGNOSIS — T466X5A Adverse effect of antihyperlipidemic and antiarteriosclerotic drugs, initial encounter: Secondary | ICD-10-CM | POA: Diagnosis not present

## 2020-05-02 DIAGNOSIS — M7121 Synovial cyst of popliteal space [Baker], right knee: Secondary | ICD-10-CM | POA: Diagnosis not present

## 2020-05-02 DIAGNOSIS — I83812 Varicose veins of left lower extremities with pain: Secondary | ICD-10-CM | POA: Diagnosis not present

## 2020-05-06 DIAGNOSIS — E7800 Pure hypercholesterolemia, unspecified: Secondary | ICD-10-CM | POA: Diagnosis not present

## 2020-05-06 DIAGNOSIS — M7072 Other bursitis of hip, left hip: Secondary | ICD-10-CM | POA: Diagnosis not present

## 2020-05-06 DIAGNOSIS — I10 Essential (primary) hypertension: Secondary | ICD-10-CM | POA: Diagnosis not present

## 2020-05-06 DIAGNOSIS — M7918 Myalgia, other site: Secondary | ICD-10-CM | POA: Diagnosis not present

## 2020-05-06 DIAGNOSIS — Z299 Encounter for prophylactic measures, unspecified: Secondary | ICD-10-CM | POA: Diagnosis not present

## 2020-05-12 ENCOUNTER — Inpatient Hospital Stay (HOSPITAL_COMMUNITY): Payer: Medicare Other | Attending: Hematology

## 2020-05-12 ENCOUNTER — Other Ambulatory Visit: Payer: Self-pay

## 2020-05-12 DIAGNOSIS — N951 Menopausal and female climacteric states: Secondary | ICD-10-CM | POA: Diagnosis not present

## 2020-05-12 DIAGNOSIS — Z79811 Long term (current) use of aromatase inhibitors: Secondary | ICD-10-CM | POA: Diagnosis not present

## 2020-05-12 DIAGNOSIS — N83201 Unspecified ovarian cyst, right side: Secondary | ICD-10-CM | POA: Diagnosis not present

## 2020-05-12 DIAGNOSIS — C50411 Malignant neoplasm of upper-outer quadrant of right female breast: Secondary | ICD-10-CM | POA: Diagnosis not present

## 2020-05-12 DIAGNOSIS — Z170 Estrogen receptor positive status [ER+]: Secondary | ICD-10-CM | POA: Insufficient documentation

## 2020-05-12 DIAGNOSIS — M8580 Other specified disorders of bone density and structure, unspecified site: Secondary | ICD-10-CM | POA: Diagnosis not present

## 2020-05-12 DIAGNOSIS — N281 Cyst of kidney, acquired: Secondary | ICD-10-CM | POA: Insufficient documentation

## 2020-05-12 LAB — COMPREHENSIVE METABOLIC PANEL
ALT: 20 U/L (ref 0–44)
AST: 22 U/L (ref 15–41)
Albumin: 4.1 g/dL (ref 3.5–5.0)
Alkaline Phosphatase: 91 U/L (ref 38–126)
Anion gap: 9 (ref 5–15)
BUN: 24 mg/dL — ABNORMAL HIGH (ref 8–23)
CO2: 25 mmol/L (ref 22–32)
Calcium: 10.2 mg/dL (ref 8.9–10.3)
Chloride: 101 mmol/L (ref 98–111)
Creatinine, Ser: 0.96 mg/dL (ref 0.44–1.00)
GFR, Estimated: >60 mL/min (ref >60)
Glucose, Bld: 88 mg/dL (ref 70–99)
Potassium: 3.8 mmol/L (ref 3.5–5.1)
Sodium: 135 mmol/L (ref 135–145)
Total Bilirubin: 0.9 mg/dL (ref 0.3–1.2)
Total Protein: 7.4 g/dL (ref 6.5–8.1)

## 2020-05-12 LAB — CBC WITH DIFFERENTIAL/PLATELET
Abs Immature Granulocytes: 0.02 10*3/uL (ref 0.00–0.07)
Basophils Absolute: 0 10*3/uL (ref 0.0–0.1)
Basophils Relative: 1 %
Eosinophils Absolute: 0.1 10*3/uL (ref 0.0–0.5)
Eosinophils Relative: 2 %
HCT: 44.2 % (ref 36.0–46.0)
Hemoglobin: 14.2 g/dL (ref 12.0–15.0)
Immature Granulocytes: 0 %
Lymphocytes Relative: 24 %
Lymphs Abs: 1.6 10*3/uL (ref 0.7–4.0)
MCH: 30.5 pg (ref 26.0–34.0)
MCHC: 32.1 g/dL (ref 30.0–36.0)
MCV: 94.8 fL (ref 80.0–100.0)
Monocytes Absolute: 0.4 10*3/uL (ref 0.1–1.0)
Monocytes Relative: 6 %
Neutro Abs: 4.4 10*3/uL (ref 1.7–7.7)
Neutrophils Relative %: 67 %
Platelets: 229 10*3/uL (ref 150–400)
RBC: 4.66 MIL/uL (ref 3.87–5.11)
RDW: 12.1 % (ref 11.5–15.5)
WBC: 6.5 10*3/uL (ref 4.0–10.5)
nRBC: 0 % (ref 0.0–0.2)

## 2020-05-12 LAB — VITAMIN D 25 HYDROXY (VIT D DEFICIENCY, FRACTURES): Vit D, 25-Hydroxy: 40.74 ng/mL (ref 30–100)

## 2020-05-13 LAB — CANCER ANTIGEN 15-3: CA 15-3: 7.4 U/mL (ref 0.0–25.0)

## 2020-05-16 ENCOUNTER — Other Ambulatory Visit: Payer: Self-pay

## 2020-05-16 ENCOUNTER — Ambulatory Visit
Admission: RE | Admit: 2020-05-16 | Discharge: 2020-05-16 | Disposition: A | Discharge status: Home / Self Care | Payer: Medicare Other | Source: Ambulatory Visit | Attending: Hematology | Admitting: Hematology

## 2020-05-16 DIAGNOSIS — Z170 Estrogen receptor positive status [ER+]: Secondary | ICD-10-CM

## 2020-05-16 DIAGNOSIS — Z853 Personal history of malignant neoplasm of breast: Secondary | ICD-10-CM | POA: Diagnosis not present

## 2020-05-19 ENCOUNTER — Inpatient Hospital Stay (HOSPITAL_BASED_OUTPATIENT_CLINIC_OR_DEPARTMENT_OTHER): Payer: Medicare Other | Admitting: Hematology

## 2020-05-19 ENCOUNTER — Other Ambulatory Visit: Payer: Self-pay

## 2020-05-19 VITALS — BP 139/64 | HR 60 | Resp 18 | Wt 231.5 lb

## 2020-05-19 DIAGNOSIS — M8580 Other specified disorders of bone density and structure, unspecified site: Secondary | ICD-10-CM | POA: Diagnosis not present

## 2020-05-19 DIAGNOSIS — C50411 Malignant neoplasm of upper-outer quadrant of right female breast: Secondary | ICD-10-CM | POA: Diagnosis not present

## 2020-05-19 DIAGNOSIS — Z170 Estrogen receptor positive status [ER+]: Secondary | ICD-10-CM | POA: Diagnosis not present

## 2020-05-19 DIAGNOSIS — N83201 Unspecified ovarian cyst, right side: Secondary | ICD-10-CM | POA: Diagnosis not present

## 2020-05-19 DIAGNOSIS — N951 Menopausal and female climacteric states: Secondary | ICD-10-CM | POA: Diagnosis not present

## 2020-05-19 DIAGNOSIS — Z79811 Long term (current) use of aromatase inhibitors: Secondary | ICD-10-CM | POA: Diagnosis not present

## 2020-05-19 NOTE — Patient Instructions (Addendum)
[facility] at [facility] Discharge Instructions  You were seen today by Dr. Katragadda. He went over your recent results and scans. Dr. Katragadda will see you back in 6 months for labs and follow up.   Thank you for choosing [facility] at [facility] to provide your oncology and hematology care.  To afford each patient quality time with our provider, please arrive at least 15 minutes before your scheduled appointment time.   If you have a lab appointment with the Cancer Center please come in thru the Main Entrance and check in at the main information desk  You need to re-schedule your appointment should you arrive 10 or more minutes late.  We strive to give you quality time with our providers, and arriving late affects you and other patients whose appointments are after yours.  Also, if you no show three or more times for appointments you may be dismissed from the clinic at the providers discretion.     Again, thank you for choosing [facility].  Our hope is that these requests will decrease the amount of time that you wait before being seen by our physicians.       _____________________________________________________________  Should you have questions after your visit to [facility], please contact our office at [phone] between the hours of 8:00 a.m. and 4:30 p.m.  Voicemails left after 4:00 p.m. will not be returned until the following business day.  For prescription refill requests, have your pharmacy contact our office and allow 72 hours.    Cancer Center Support Programs:   > Cancer Support Group  2nd Tuesday of the month 1pm-2pm, Journey Room   

## 2020-05-19 NOTE — Progress Notes (Signed)
[place], [place] [address]   Patient Care Team: Monico Blitz, MD as PCP - General (Internal Medicine)  SUMMARY OF ONCOLOGIC HISTORY: Oncology History  Carcinoma of upper-outer quadrant of right breast in female, estrogen receptor positive ([place])  06/11/2017 Initial Diagnosis   Carcinoma of upper-outer quadrant of right breast in female, estrogen receptor positive ([place])   07/08/2017 Genetic Testing   Negative genetic testing on the Multi-cancer panel.  The Multi-Gene Panel offered by [facility] includes sequencing and/or deletion duplication testing of the following 83 genes: ALK, APC, ATM, AXIN2,BAP1,  BARD1, BLM, BMPR1A, BRCA1, BRCA2, BRIP1, CASR, CDC73, CDH1, CDK4, CDKN1B, CDKN1C, CDKN2A (p14ARF), CDKN2A (p16INK4a), CEBPA, CHEK2, CTNNA1, DICER1, DIS3L2, EGFR (c.2369C>T, p.Thr790Met variant only), EPCAM (Deletion/duplication testing only), FH, FLCN, GATA2, GPC3, GREM1 (Promoter region deletion/duplication testing only), HOXB13 (c.251G>A, p.Gly84Glu), HRAS, KIT, MAX, MEN1, MET, MITF (c.952G>A, p.Glu318Lys variant only), MLH1, MSH2, MSH3, MSH6, MUTYH, NBN, NF1, NF2, NTHL1, PALB2, PDGFRA, PHOX2B, PMS2, POLD1, POLE, POT1, PRKAR1A, PTCH1, PTEN, RAD50, RAD51C, RAD51D, RB1, RECQL4, RET, RUNX1, SDHAF2, SDHA (sequence changes only), SDHB, SDHC, SDHD, SMAD4, SMARCA4, SMARCB1, SMARCE1, STK11, SUFU, TERT, TERT, TMEM127, TP53, TSC1, TSC2, VHL, WRN and WT1.  The report date is July 08, 2017.      CHIEF COMPLIANT: Follow-up for right breast cancer   INTERVAL HISTORY: [name] is a 74 y.o. female here today for follow up of her right breast cancer. Her last visit was on 11/17/2019.   Today she reports feeling well. She is tolerating Arimidex well and reports having hot flashes daily but denies breast pain or tenderness. She complains of having left upper leg pain, worse when she sits or drives for a prolonged time compared to when she walks; she is  taking diclofenac daily for the pain. She is taking calcium and vitamin D daily.   REVIEW OF SYSTEMS:   Review of Systems  Constitutional: Positive for fatigue (90%). Negative for appetite change.  Musculoskeletal: Positive for arthralgias (2/10 (standing) to 7/10 (sitting) L leg pain).  Neurological: Positive for numbness (L hand).    I have reviewed the past medical history, past surgical history, social history and family history with the patient and they are unchanged from previous note.   ALLERGIES:   is allergic to nitrostat [nitroglycerin].   MEDICATIONS:  Current Outpatient Medications  Medication Sig Dispense Refill  . amLODipine (NORVASC) 10 MG tablet Take 10 mg by mouth daily.    Marland Kitchen anastrozole (ARIMIDEX) 1 MG tablet Take 1 tablet (1 mg total) by mouth daily. 90 tablet 3  . aspirin EC 81 MG tablet Take 81 mg by mouth daily.    . cholecalciferol (VITAMIN D) 1000 units tablet Take 2,000 Units by mouth daily.     Marland Kitchen co-enzyme Q-10 30 MG capsule Take 100 mg by mouth daily.    . diclofenac (VOLTAREN) 75 MG EC tablet Take 75 mg by mouth 2 (two) times daily.    Marland Kitchen losartan-hydrochlorothiazide (HYZAAR) 100-25 MG per tablet Take 1 tablet by mouth every morning.     . metoprolol succinate (TOPROL-XL) 25 MG 24 hr tablet Take 25 mg by mouth every morning.     . Omega-3 Fatty Acids (OMEGA 3 500) 500 MG CAPS Take by mouth.    Marland Kitchen omeprazole (PRILOSEC) 20 MG capsule Take 20 mg by mouth daily.    . potassium chloride SA (K-DUR) 20 MEQ tablet TAKE 1 TABLET BY MOUTH EVERY DAY 30 tablet 1  . diclofenac  sodium (VOLTAREN) 1 % GEL Apply 2 g topically 4 (four) times daily as needed. (Patient not taking: Reported on 05/19/2020) 100 g 0   No current facility-administered medications for this visit.     PHYSICAL EXAMINATION: Performance status (ECOG): 1 - Symptomatic but completely ambulatory  Vitals:   05/19/20 0755  BP: 139/64  Pulse: 60  Resp: 18  SpO2: 100%   Wt Readings from Last 3  Encounters:  05/19/20 231 lb 8 oz (105 kg)  11/17/19 235 lb (106.6 kg)  05/18/19 231 lb 11.2 oz (105.1 kg)   Physical Exam Vitals reviewed.  Constitutional:      Appearance: Normal appearance. She is obese.  Cardiovascular:     Rate and Rhythm: Normal rate and regular rhythm.     Pulses: Normal pulses.     Heart sounds: Normal heart sounds.  Pulmonary:     Effort: Pulmonary effort is normal.     Breath sounds: Normal breath sounds.  Chest:     Breasts:        Right: Skin change (upper-outer lumpectomy scar well-healed) present. No bleeding, mass, nipple discharge or tenderness.        Left: Normal. No bleeding, mass, nipple discharge, skin change or tenderness.  Neurological:     General: No focal deficit present.     Mental Status: She is alert and oriented to person, place, and time.  Psychiatric:        Mood and Affect: Mood normal.        Behavior: Behavior normal.     Breast Exam Chaperone: Milinda Antis, MD     LABORATORY DATA:  I have reviewed the data as listed CMP Latest Ref Rng & Units 05/12/2020 11/10/2019 05/13/2019  Glucose 70 - 99 mg/dL 88 100(H) 94  BUN 8 - 23 mg/dL 24(H) 24(H) 21  Creatinine 0.44 - 1.00 mg/dL 0.96 0.96 0.91  Sodium 135 - 145 mmol/L 135 139 137  Potassium 3.5 - 5.1 mmol/L 3.8 4.2 3.8  Chloride 98 - 111 mmol/L 101 101 101  CO2 22 - 32 mmol/L _0 Calcium 8.9 - 10.3 mg/dL 10.2 10.2 10.4(H)  Total Protein 6.5 - 8.1 g/dL 7.4 7.3 7.1  Total Bilirubin 0.3 - 1.2 mg/dL 0.9 0.7 0.9  Alkaline Phos 38 - 126 U/L 91 109 101  AST 15 - 41 U/L _1 ALT 0 - 44 U/L _2 Lab Results  Component Value Date   CAN153 7.4 05/12/2020   CAN153 7.1 11/10/2019   CAN153 6.6 05/13/2019   Lab Results  Component Value Date   WBC 6.5 05/12/2020   HGB 14.2 05/12/2020   HCT 44.2 05/12/2020   MCV 94.8 05/12/2020   PLT 229 05/12/2020   NEUTROABS 4.4 05/12/2020   Lab Results  Component Value Date   VD25OH 40.74 05/12/2020   VD25OH 24.93 (L)  11/10/2019   VD25OH 35.79 05/13/2019    ASSESSMENT:  1.  Right breast infiltrating ductal carcinoma, ER/PR positive HER-2 negative: -Lumpectomy on 06/11/2017, 1.4 cm, 0/2 sentinel lymph nodes positive, margins negative, ER/PR positive, HER-2 negative, Ki-67 5%. -Oncotype DX recurrence score of 10.  Negative genetic testing for hereditary breast cancer. -Radiation therapy finished in January 2019.  Anastrozole started on 09/06/2017. -Mammogram from 05/16/2020 was BI-RADS Category 2.  2.  Osteopenia: -DEXA scan at Dr. Trena Platt office on 08/07/2018 shows T score -1.4.  3.  Right ovarian cyst and left renal lesion: -Recent pelvic ultrasound on 04/17/2019  shows right ovary entirely occupied by a simple cyst measuring 3.3 x 2.4 x 1.7 cm.  This has benign characteristics.  CA-125 done by Dr. Manuella Ghazi on 05/13/2019 was 6.1. -Ultrasound of the abdomen on 04/17/2019 shows multiple parapelvic renal cysts bilaterally with cortical thinning and increased echogenicity.  Upper pole left renal cyst similar to that visualized on prior CT scan.  Fatty infiltration of the liver present.   PLAN:  1.  Right breast infiltrating ductal carcinoma, ER/PR positive HER-2 negative: -Lumpectomy scar in the upper outer quadrant is stable.  No palpable mass. -Reviewed labs from 05/12/2020 which showed normal LFTs and CBC.  Tumor marker was normal. -Mammogram from 05/16/2020 was BI-RADS Category 2. -RTC 6 months with repeat labs and physical exam.  2.  Osteopenia: -Vitamin D level was 40.74.  Continue calcium and vitamin D. -We will consider repeating DEXA scan in a year.   Breast Cancer therapy associated bone loss: I have recommended calcium, Vitamin D and weight bearing exercises.   No orders of the defined types were placed in this encounter.  The patient has a good understanding of the overall plan. she agrees with it. she will call with any problems that may develop before the next visit here.    Derek Jack, MD [place] [phone]   I, Milinda Antis, am acting as a scribe for Dr. Sanda Linger.  I, Derek Jack MD, have reviewed the above documentation for accuracy and completeness, and I agree with the above.

## 2020-06-07 DIAGNOSIS — E7800 Pure hypercholesterolemia, unspecified: Secondary | ICD-10-CM | POA: Diagnosis not present

## 2020-06-07 DIAGNOSIS — I10 Essential (primary) hypertension: Secondary | ICD-10-CM | POA: Diagnosis not present

## 2020-06-23 DIAGNOSIS — M1990 Unspecified osteoarthritis, unspecified site: Secondary | ICD-10-CM | POA: Diagnosis not present

## 2020-06-23 DIAGNOSIS — Z299 Encounter for prophylactic measures, unspecified: Secondary | ICD-10-CM | POA: Diagnosis not present

## 2020-06-23 DIAGNOSIS — I10 Essential (primary) hypertension: Secondary | ICD-10-CM | POA: Diagnosis not present

## 2020-06-23 DIAGNOSIS — I8390 Asymptomatic varicose veins of unspecified lower extremity: Secondary | ICD-10-CM | POA: Diagnosis not present

## 2020-06-23 DIAGNOSIS — C50911 Malignant neoplasm of unspecified site of right female breast: Secondary | ICD-10-CM | POA: Diagnosis not present

## 2020-07-07 DIAGNOSIS — E7800 Pure hypercholesterolemia, unspecified: Secondary | ICD-10-CM | POA: Diagnosis not present

## 2020-07-07 DIAGNOSIS — I10 Essential (primary) hypertension: Secondary | ICD-10-CM | POA: Diagnosis not present

## 2020-07-11 ENCOUNTER — Other Ambulatory Visit (HOSPITAL_COMMUNITY): Payer: Self-pay | Admitting: Surgery

## 2020-07-11 DIAGNOSIS — Z170 Estrogen receptor positive status [ER+]: Secondary | ICD-10-CM

## 2020-07-11 DIAGNOSIS — C50411 Malignant neoplasm of upper-outer quadrant of right female breast: Secondary | ICD-10-CM

## 2020-07-11 MED ORDER — ANASTROZOLE 1 MG PO TABS: 1.0000 mg | ORAL_TABLET | Freq: Every day | ORAL | 3 refills | Status: DC

## 2020-07-15 ENCOUNTER — Other Ambulatory Visit (HOSPITAL_COMMUNITY): Payer: Self-pay

## 2020-07-15 DIAGNOSIS — Z299 Encounter for prophylactic measures, unspecified: Secondary | ICD-10-CM | POA: Diagnosis not present

## 2020-07-15 DIAGNOSIS — C50911 Malignant neoplasm of unspecified site of right female breast: Secondary | ICD-10-CM | POA: Diagnosis not present

## 2020-07-15 DIAGNOSIS — C50411 Malignant neoplasm of upper-outer quadrant of right female breast: Secondary | ICD-10-CM

## 2020-07-15 DIAGNOSIS — I10 Essential (primary) hypertension: Secondary | ICD-10-CM | POA: Diagnosis not present

## 2020-07-15 DIAGNOSIS — Z170 Estrogen receptor positive status [ER+]: Secondary | ICD-10-CM

## 2020-07-15 DIAGNOSIS — J069 Acute upper respiratory infection, unspecified: Secondary | ICD-10-CM | POA: Diagnosis not present

## 2020-07-15 DIAGNOSIS — Z789 Other specified health status: Secondary | ICD-10-CM | POA: Diagnosis not present

## 2020-07-15 DIAGNOSIS — J029 Acute pharyngitis, unspecified: Secondary | ICD-10-CM | POA: Diagnosis not present

## 2020-07-18 MED ORDER — ANASTROZOLE 1 MG PO TABS: 1.0000 mg | ORAL_TABLET | Freq: Every day | ORAL | 3 refills | Status: DC

## 2020-08-10 DIAGNOSIS — N183 Chronic kidney disease, stage 3 unspecified: Secondary | ICD-10-CM | POA: Diagnosis not present

## 2020-08-10 DIAGNOSIS — E7800 Pure hypercholesterolemia, unspecified: Secondary | ICD-10-CM | POA: Diagnosis not present

## 2020-08-10 DIAGNOSIS — Z79899 Other long term (current) drug therapy: Secondary | ICD-10-CM | POA: Diagnosis not present

## 2020-08-10 DIAGNOSIS — Z7189 Other specified counseling: Secondary | ICD-10-CM | POA: Diagnosis not present

## 2020-08-10 DIAGNOSIS — E559 Vitamin D deficiency, unspecified: Secondary | ICD-10-CM | POA: Diagnosis not present

## 2020-08-10 DIAGNOSIS — Z0000 Encounter for general adult medical examination without abnormal findings: Secondary | ICD-10-CM | POA: Diagnosis not present

## 2020-08-10 DIAGNOSIS — Z1339 Encounter for screening examination for other mental health and behavioral disorders: Secondary | ICD-10-CM | POA: Diagnosis not present

## 2020-08-10 DIAGNOSIS — R5383 Other fatigue: Secondary | ICD-10-CM | POA: Diagnosis not present

## 2020-08-10 DIAGNOSIS — Z6836 Body mass index (BMI) 36.0-36.9, adult: Secondary | ICD-10-CM | POA: Diagnosis not present

## 2020-08-10 DIAGNOSIS — Z299 Encounter for prophylactic measures, unspecified: Secondary | ICD-10-CM | POA: Diagnosis not present

## 2020-08-10 DIAGNOSIS — Z1331 Encounter for screening for depression: Secondary | ICD-10-CM | POA: Diagnosis not present

## 2020-08-11 ENCOUNTER — Encounter (INDEPENDENT_AMBULATORY_CARE_PROVIDER_SITE_OTHER): Payer: Self-pay | Admitting: *Deleted

## 2020-08-19 DIAGNOSIS — E2839 Other primary ovarian failure: Secondary | ICD-10-CM | POA: Diagnosis not present

## 2020-09-05 DIAGNOSIS — I10 Essential (primary) hypertension: Secondary | ICD-10-CM | POA: Diagnosis not present

## 2020-09-05 DIAGNOSIS — L239 Allergic contact dermatitis, unspecified cause: Secondary | ICD-10-CM | POA: Diagnosis not present

## 2020-09-05 DIAGNOSIS — Z299 Encounter for prophylactic measures, unspecified: Secondary | ICD-10-CM | POA: Diagnosis not present

## 2020-09-05 DIAGNOSIS — Z789 Other specified health status: Secondary | ICD-10-CM | POA: Diagnosis not present

## 2020-10-06 ENCOUNTER — Telehealth (INDEPENDENT_AMBULATORY_CARE_PROVIDER_SITE_OTHER): Payer: Self-pay

## 2020-10-06 ENCOUNTER — Encounter (INDEPENDENT_AMBULATORY_CARE_PROVIDER_SITE_OTHER): Payer: Self-pay

## 2020-10-06 ENCOUNTER — Other Ambulatory Visit (INDEPENDENT_AMBULATORY_CARE_PROVIDER_SITE_OTHER): Payer: Self-pay

## 2020-10-06 DIAGNOSIS — Z1211 Encounter for screening for malignant neoplasm of colon: Secondary | ICD-10-CM

## 2020-10-06 MED ORDER — NA SULFATE-K SULFATE-MG SULF 17.5-3.13-1.6 GM/177ML PO SOLN: 354.0000 mL | Freq: Once | ORAL | 0 refills | Status: AC

## 2020-10-06 NOTE — Telephone Encounter (Signed)
Marissa [name], CMA  

## 2020-10-06 NOTE — Telephone Encounter (Signed)
Ok to schedule.  Thanks,  Kierstan Auer Castaneda Mayorga, MD Gastroenterology and Hepatology [facility]  

## 2020-10-06 NOTE — Telephone Encounter (Signed)
Referring MD/PCP: Manuella Ghazi  Procedure: TCS  Reason/Indication:  Screening  Has patient had this procedure before?  yes  If so, when, by whom and where?  12 years ago  Is there a family history of colon cancer?  no  Who?  What age when diagnosed?    Is patient diabetic?   no      Does patient have prosthetic heart valve or mechanical valve?  no  Do you have a pacemaker/defibrillator?  no  Has patient ever had endocarditis/atrial fibrillation? no  Have you had a stroke/heart attack last 6 mths? no  Does patient use oxygen? no  Has patient had joint replacement within last 12 months?  no  Is patient constipated or do they take laxatives? no  Does patient have a history of alcohol/drug use?  no  Is patient on blood thinner such as Coumadin, Plavix and/or Aspirin? yes  Do you take medicine for weight loss. no  Medications: Amlodipine 10mg  daily, Anastrozole 1 mg daily, Vit D 1000 U daily, Asa 81 mg dialy, CO Q 10-30 mg daily, Diclofenac 75 mg bid , Diclorenac 1% GEL prn, Losartan/HCTZ 100/25 mg daily, Metoprolol 25 mg daily, Omega 3 500 mg daily, Omeprazole 20 mg daily, Potassium 20 MEQ tab daily  Allergies: Nitroglycerin  Procedure date & time: 10/28/20/ AM

## 2020-10-20 NOTE — Patient Instructions (Signed)
Your procedure is scheduled on: 10/28/2020  Report to [facility] at  8:45   AM.  Call this number if you have problems the morning of surgery: [phone]   Remember:              Follow Directions on the letter you received from Your Physician's office regarding the Bowel Prep              No Smoking the day of Procedure :   Take these medicines the morning of surgery with A SIP OF WATER: Amlodipine, Metoprolol and Omeprazole   Do not wear jewelry, make-up or nail polish.    Do not bring valuables to the hospital.  Contacts, dentures or bridgework may not be worn into surgery.  .   Patients discharged the day of surgery will not be allowed to drive home.     Colonoscopy, Adult, Care After This sheet gives you information about how to care for yourself after your procedure. Your health care provider may also give you more specific instructions. If you have problems or questions, contact your health care provider. What can I expect after the procedure? After the procedure, it is common to have:  A small amount of blood in your stool for 24 hours after the procedure.  Some gas.  Mild abdominal cramping or bloating.  Follow these instructions at home: General instructions   For the first 24 hours after the procedure: ? Do not drive or use machinery. ? Do not sign important documents. ? Do not drink alcohol. ? Do your regular daily activities at a slower pace than normal. ? Eat soft, easy-to-digest foods. ? Rest often.  Take over-the-counter or prescription medicines only as told by your health care provider.  It is up to you to get the results of your procedure. Ask your health care provider, or the department performing the procedure, when your results will be ready. Relieving cramping and bloating  Try walking around when you have cramps or feel bloated.  Apply heat to your abdomen as told by your health care provider. Use a heat source that your health care  provider recommends, such as a moist heat pack or a heating pad. ? Place a towel between your skin and the heat source. ? Leave the heat on for 20-30 minutes. ? Remove the heat if your skin turns bright red. This is especially important if you are unable to feel pain, heat, or cold. You may have a greater risk of getting burned. Eating and drinking  Drink enough fluid to keep your urine clear or pale yellow.  Resume your normal diet as instructed by your health care provider. Avoid heavy or fried foods that are hard to digest.  Avoid drinking alcohol for as long as instructed by your health care provider. Contact a health care provider if:  You have blood in your stool 2-3 days after the procedure. Get help right away if:  You have more than a small spotting of blood in your stool.  You pass large blood clots in your stool.  Your abdomen is swollen.  You have nausea or vomiting.  You have a fever.  You have increasing abdominal pain that is not relieved with medicine. This information is not intended to replace advice given to you by your health care provider. Make sure you discuss any questions you have with your health care provider. Document Released: 02/07/2004 Document Revised: 03/19/2016 Document Reviewed: 09/06/2015 Elsevier Interactive Patient Education  2018  [employer].

## 2020-10-26 ENCOUNTER — Encounter (HOSPITAL_COMMUNITY)
Admission: RE | Admit: 2020-10-26 | Discharge: 2020-10-26 | Disposition: A | Discharge status: Home / Self Care | Payer: Medicare Other | Source: Ambulatory Visit | Attending: Gastroenterology | Admitting: Gastroenterology

## 2020-10-26 ENCOUNTER — Other Ambulatory Visit: Payer: Self-pay

## 2020-10-26 ENCOUNTER — Encounter (HOSPITAL_COMMUNITY): Payer: Self-pay

## 2020-10-26 ENCOUNTER — Other Ambulatory Visit (HOSPITAL_COMMUNITY)
Admission: RE | Admit: 2020-10-26 | Discharge: 2020-10-26 | Disposition: A | Discharge status: Home / Self Care | Payer: Medicare Other | Source: Ambulatory Visit | Attending: Gastroenterology | Admitting: Gastroenterology

## 2020-10-26 DIAGNOSIS — Z20822 Contact with and (suspected) exposure to covid-19: Secondary | ICD-10-CM | POA: Diagnosis not present

## 2020-10-26 DIAGNOSIS — Z01818 Encounter for other preprocedural examination: Secondary | ICD-10-CM | POA: Diagnosis not present

## 2020-10-26 LAB — BASIC METABOLIC PANEL
Anion gap: 8 (ref 5–15)
BUN: 27 mg/dL — ABNORMAL HIGH (ref 8–23)
CO2: 26 mmol/L (ref 22–32)
Calcium: 9.8 mg/dL (ref 8.9–10.3)
Chloride: 105 mmol/L (ref 98–111)
Creatinine, Ser: 0.96 mg/dL (ref 0.44–1.00)
GFR, Estimated: >60 mL/min (ref >60)
Glucose, Bld: 83 mg/dL (ref 70–99)
Potassium: 3.9 mmol/L (ref 3.5–5.1)
Sodium: 139 mmol/L (ref 135–145)

## 2020-10-26 LAB — SARS CORONAVIRUS 2 (TAT 6-24 HRS): SARS Coronavirus 2: NEGATIVE

## 2020-10-28 ENCOUNTER — Encounter (HOSPITAL_COMMUNITY)
Admission: RE | Disposition: A | Discharge status: Home / Self Care | Payer: Self-pay | Source: Ambulatory Visit | Attending: Gastroenterology

## 2020-10-28 ENCOUNTER — Encounter (HOSPITAL_COMMUNITY): Payer: Self-pay | Admitting: Gastroenterology

## 2020-10-28 ENCOUNTER — Ambulatory Visit (HOSPITAL_COMMUNITY): Payer: Medicare Other | Admitting: Anesthesiology

## 2020-10-28 ENCOUNTER — Ambulatory Visit (HOSPITAL_COMMUNITY)
Admission: RE | Admit: 2020-10-28 | Discharge: 2020-10-28 | Disposition: A | Discharge status: Home / Self Care | Payer: Medicare Other | Source: Ambulatory Visit | Attending: Gastroenterology | Admitting: Gastroenterology

## 2020-10-28 ENCOUNTER — Other Ambulatory Visit: Payer: Self-pay

## 2020-10-28 DIAGNOSIS — K219 Gastro-esophageal reflux disease without esophagitis: Secondary | ICD-10-CM | POA: Insufficient documentation

## 2020-10-28 DIAGNOSIS — Z1211 Encounter for screening for malignant neoplasm of colon: Secondary | ICD-10-CM

## 2020-10-28 DIAGNOSIS — Z803 Family history of malignant neoplasm of breast: Secondary | ICD-10-CM | POA: Insufficient documentation

## 2020-10-28 DIAGNOSIS — I10 Essential (primary) hypertension: Secondary | ICD-10-CM | POA: Diagnosis not present

## 2020-10-28 DIAGNOSIS — Z807 Family history of other malignant neoplasms of lymphoid, hematopoietic and related tissues: Secondary | ICD-10-CM | POA: Insufficient documentation

## 2020-10-28 DIAGNOSIS — Z853 Personal history of malignant neoplasm of breast: Secondary | ICD-10-CM | POA: Diagnosis not present

## 2020-10-28 DIAGNOSIS — Z791 Long term (current) use of non-steroidal anti-inflammatories (NSAID): Secondary | ICD-10-CM | POA: Insufficient documentation

## 2020-10-28 DIAGNOSIS — Z8041 Family history of malignant neoplasm of ovary: Secondary | ICD-10-CM | POA: Insufficient documentation

## 2020-10-28 DIAGNOSIS — E785 Hyperlipidemia, unspecified: Secondary | ICD-10-CM | POA: Insufficient documentation

## 2020-10-28 DIAGNOSIS — K5730 Diverticulosis of large intestine without perforation or abscess without bleeding: Secondary | ICD-10-CM

## 2020-10-28 DIAGNOSIS — F419 Anxiety disorder, unspecified: Secondary | ICD-10-CM | POA: Diagnosis not present

## 2020-10-28 DIAGNOSIS — K635 Polyp of colon: Secondary | ICD-10-CM | POA: Diagnosis not present

## 2020-10-28 DIAGNOSIS — Z7982 Long term (current) use of aspirin: Secondary | ICD-10-CM | POA: Insufficient documentation

## 2020-10-28 DIAGNOSIS — Z801 Family history of malignant neoplasm of trachea, bronchus and lung: Secondary | ICD-10-CM | POA: Insufficient documentation

## 2020-10-28 DIAGNOSIS — Z79811 Long term (current) use of aromatase inhibitors: Secondary | ICD-10-CM | POA: Diagnosis not present

## 2020-10-28 DIAGNOSIS — Z800 Family history of malignant neoplasm of digestive organs: Secondary | ICD-10-CM | POA: Diagnosis not present

## 2020-10-28 DIAGNOSIS — Z79899 Other long term (current) drug therapy: Secondary | ICD-10-CM | POA: Insufficient documentation

## 2020-10-28 DIAGNOSIS — D123 Benign neoplasm of transverse colon: Secondary | ICD-10-CM | POA: Diagnosis not present

## 2020-10-28 DIAGNOSIS — K648 Other hemorrhoids: Secondary | ICD-10-CM | POA: Diagnosis not present

## 2020-10-28 DIAGNOSIS — Z808 Family history of malignant neoplasm of other organs or systems: Secondary | ICD-10-CM | POA: Diagnosis not present

## 2020-10-28 HISTORY — PX: POLYPECTOMY: SHX5525

## 2020-10-28 HISTORY — PX: COLONOSCOPY WITH PROPOFOL: SHX5780

## 2020-10-28 LAB — HM COLONOSCOPY

## 2020-10-28 SURGERY — COLONOSCOPY WITH PROPOFOL
Anesthesia: General

## 2020-10-28 MED ORDER — LACTATED RINGERS IV SOLN: INTRAVENOUS | Status: DC

## 2020-10-28 MED ORDER — PROPOFOL 10 MG/ML IV BOLUS
INTRAVENOUS | Status: DC | PRN
  Administered 2020-10-28: 40 mg via INTRAVENOUS
  Administered 2020-10-28: 50 mg via INTRAVENOUS

## 2020-10-28 MED ORDER — PROPOFOL 500 MG/50ML IV EMUL
INTRAVENOUS | Status: DC | PRN
  Administered 2020-10-28: 150 ug/kg/min via INTRAVENOUS

## 2020-10-28 MED ORDER — CHLORHEXIDINE GLUCONATE CLOTH 2 % EX PADS: 6.0000 | MEDICATED_PAD | Freq: Once | CUTANEOUS | Status: DC

## 2020-10-28 NOTE — H&P (Signed)
Marissa [name] is an 75 y.o. female.   Chief Complaint: Colorectal cancer screening HPI: 75 year old female with past medical history of a right breast cancer, GERD, hyperlipidemia and hypertension, coming for screening colonoscopy. The patient reports her last colonoscopy was close to 12 years ago, normal per patient.  The patient denies having any complaints such as melena, hematochezia, abdominal pain or distention, change in her bowel movement consistency or frequency, no changes in her weight recently.  No family history of colorectal cancer.   Past Medical History:  Diagnosis Date  . Anxiety   . Cancer ([place]) 05/2017   right breast cancer  . Family history of breast cancer   . Family history of ovarian cancer   . Family history of stomach cancer   . GERD (gastroesophageal reflux disease)   . High cholesterol   . History of radiation therapy 07/03/17- 07/31/2017   Right Breast/ 40/05 Gy in 15 fractions, Right Breast boost/ 10 Gy in 5 fractions.   . Hypertension   . Personal history of radiation therapy 2018/2019    Past Surgical History:  Procedure Laterality Date  . BREAST BIOPSY Right 05/09/2017   x2  . BREAST LUMPECTOMY Right 05/20/2017  . CARDIAC CATHETERIZATION     2008- negative findings  . CATARACT EXTRACTION W/PHACO Right 09/06/2014   Procedure: CATARACT EXTRACTION PHACO AND INTRAOCULAR LENS PLACEMENT; CDE:  3.85;  Surgeon: Williams Che, MD;  Location: [facility];  Service: Ophthalmology;  Laterality: Right;  . CATARACT EXTRACTION W/PHACO Left 12/13/2014   Procedure: CATARACT EXTRACTION PHACO AND INTRAOCULAR LENS PLACEMENT LEFT EYE CDE=10.79;  Surgeon: Williams Che, MD;  Location: [facility];  Service: Ophthalmology;  Laterality: Left;  . ESOPHAGOGASTRODUODENOSCOPY N/A 05/05/2014   Procedure: ESOPHAGOGASTRODUODENOSCOPY (EGD);  Surgeon: Rogene Houston, MD;  Location: [facility];  Service: Endoscopy;  Laterality: N/A;  255  . EUS N/A 06/10/2014   Procedure: UPPER  ENDOSCOPIC ULTRASOUND (EUS) LINEAR;  Surgeon: Milus Banister, MD;  Location: [facility];  Service: Endoscopy;  Laterality: N/A;  . RADIOACTIVE SEED GUIDED PARTIAL MASTECTOMY WITH AXILLARY SENTINEL LYMPH NODE BIOPSY Right 05/20/2017   Procedure: RIGHT BREAST LUMPECTOMY WITH RADIOACTIVE SEED AND SENTINEL LYMPH NODE BIOPSY;  Surgeon: Rolm Bookbinder, MD;  Location: [facility];  Service: General;  Laterality: Right;  . TUBAL LIGATION      Family History  Problem Relation Age of Onset  . CAD Mother 51  . Leukemia Father   . Parkinson's disease Father   . Breast cancer Daughter 35       BRCA neg  . Heart Problems Maternal Aunt   . Cancer Maternal Uncle        NOS  . Cancer Paternal Uncle        NOS  . Diabetes Maternal Grandfather   . Liver cancer Maternal Grandfather   . Cancer Paternal Grandmother        NOS  . Brain cancer Maternal Aunt   . Lung cancer Maternal Aunt        smoker  . Cancer Maternal Uncle        NOS  . Stomach cancer Maternal Uncle   . Lung cancer Maternal Uncle        smoker  . Breast cancer Cousin        mat first cousin  . Breast cancer Cousin        mat first cousin dx under 77  . Breast cancer Cousin  2 additional mat first cousins  . Ovarian cancer Cousin        mat first cousin  . Cancer Cousin        several female mat first cousins with cancer NOS  . Breast cancer Cousin        2 pat first cousins  . Cancer Cousin        3 pat first cousins with cancer NOS   Social History:  reports that she has never smoked. She has never used smokeless tobacco. She reports that she does not drink alcohol and does not use drugs.  Allergies:  Allergies  Allergen Reactions  . Nitrostat [Nitroglycerin] Shortness Of Breath    Medications Prior to Admission  Medication Sig Dispense Refill  . amLODipine (NORVASC) 10 MG tablet Take 10 mg by mouth daily.    Marland Kitchen anastrozole (ARIMIDEX) 1 MG tablet Take 1 tablet (1 mg total) by mouth daily. 90  tablet 3  . aspirin EC 81 MG tablet Take 81 mg by mouth daily.    . Cholecalciferol (VITAMIN D) 125 MCG (5000 UT) CAPS Take 5,000 Units by mouth daily.    . Coenzyme Q10 200 MG capsule Take 200 mg by mouth daily.    . diclofenac sodium (VOLTAREN) 1 % GEL Apply 2 g topically 4 (four) times daily as needed. (Patient taking differently: Apply 2 g topically 4 (four) times daily as needed (pain).) 100 g 0  . ezetimibe (ZETIA) 10 MG tablet Take 10 mg by mouth daily.    Boris Lown Oil 500 MG CAPS Take 500 mg by mouth daily.    Marland Kitchen losartan-hydrochlorothiazide (HYZAAR) 100-25 MG per tablet Take 1 tablet by mouth every morning.     . metoprolol succinate (TOPROL-XL) 25 MG 24 hr tablet Take 25 mg by mouth every morning.     Marland Kitchen omeprazole (PRILOSEC) 20 MG capsule Take 20 mg by mouth daily.    . diclofenac (VOLTAREN) 75 MG EC tablet Take 75 mg by mouth 2 (two) times daily.    . potassium chloride SA (K-DUR) 20 MEQ tablet TAKE 1 TABLET BY MOUTH EVERY DAY (Patient not taking: No sig reported) 30 tablet 1    Results for orders placed or performed during the hospital encounter of 10/26/20 (from the past 48 hour(s))  Basic metabolic panel     Status: Abnormal   Collection Time: 10/26/20 11:37 AM  Result Value Ref Range   Sodium 139 135 - 145 mmol/L   Potassium 3.9 3.5 - 5.1 mmol/L   Chloride 105 98 - 111 mmol/L   CO2 26 22 - 32 mmol/L   Glucose, Bld 83 70 - 99 mg/dL    Comment: Glucose reference range applies only to samples taken after fasting for at least 8 hours.   BUN 27 (H) 8 - 23 mg/dL   Creatinine, Ser 0.30 0.44 - 1.00 mg/dL   Calcium 9.8 8.9 - 35.6 mg/dL   GFR, Estimated >34 >24 mL/min    Comment: (NOTE) Calculated using the CKD-EPI Creatinine Equation (2021)    Anion gap 8 5 - 15    Comment: Performed at [facility], [address], [place], [place] [address]   No results found.  Review of Systems  Constitutional: Negative.   HENT: Negative.   Eyes: Negative.   Respiratory: Negative.    Cardiovascular: Negative.   Gastrointestinal: Negative.   Endocrine: Negative.   Genitourinary: Negative.   Musculoskeletal: Negative.   Skin: Negative.   Allergic/Immunologic: Negative.  Neurological: Negative.   Hematological: Negative.   Psychiatric/Behavioral: Negative.     HR 72 BP 135/83 RR 12 O2 sat 98% Physical Exam  .GENERAL: The patient is AO x3, in no acute distress. HEENT: Head is normocephalic and atraumatic. EOMI are intact. Mouth is well hydrated and without lesions. NECK: Supple. No masses LUNGS: Clear to auscultation. No presence of rhonchi/wheezing/rales. Adequate chest expansion HEART: RRR, normal s1 and s2. ABDOMEN: Soft, nontender, no guarding, no peritoneal signs, and nondistended. BS +. No masses. EXTREMITIES: Without any cyanosis, clubbing, rash, lesions or edema. NEUROLOGIC: AOx3, no focal motor deficit. SKIN: no jaundice, no rashes  Assessment/Plan 75 year old female with past medical history of a right breast cancer, GERD, hyperlipidemia and hypertension, coming for screening colonoscopy.The patient is at average risk for colorectal cancer.  We will proceed with colonoscopy today.   Harvel Quale, MD 10/28/2020, 10:29 AM

## 2020-10-28 NOTE — Anesthesia Preprocedure Evaluation (Signed)
Anesthesia Evaluation  Patient identified by MRN, date of birth, ID band Patient awake    Reviewed: Allergy & Precautions, H&P , NPO status , Patient's Chart, lab work & pertinent test results, reviewed documented beta blocker date and time   Airway Mallampati: II  TM Distance: >3 FB Neck ROM: full    Dental no notable dental hx.    Pulmonary neg pulmonary ROS,    Pulmonary exam normal breath sounds clear to auscultation       Cardiovascular Exercise Tolerance: Good hypertension, negative cardio ROS   Rhythm:regular Rate:Normal     Neuro/Psych PSYCHIATRIC DISORDERS Anxiety negative neurological ROS     GI/Hepatic Neg liver ROS, GERD  Medicated,  Endo/Other  negative endocrine ROS  Renal/GU negative Renal ROS  negative genitourinary   Musculoskeletal   Abdominal   Peds  Hematology negative hematology ROS (+)   Anesthesia Other Findings   Reproductive/Obstetrics negative OB ROS                             Anesthesia Physical Anesthesia Plan  ASA: II  Anesthesia Plan: General   Post-op Pain Management:    Induction:   PONV Risk Score and Plan: Propofol infusion  Airway Management Planned:   Additional Equipment:   Intra-op Plan:   Post-operative Plan:   Informed Consent: I have reviewed the patients History and Physical, chart, labs and discussed the procedure including the risks, benefits and alternatives for the proposed anesthesia with the patient or authorized representative who has indicated his/her understanding and acceptance.     Dental Advisory Given  Plan Discussed with: CRNA  Anesthesia Plan Comments:         Anesthesia Quick Evaluation  

## 2020-10-28 NOTE — Discharge Instructions (Signed)
You are being discharged to home.  Resume your previous diet.  We are waiting for your pathology results.  Your physician has recommended a repeat colonoscopy for surveillance based on pathology results.   Colonoscopy, Adult, Care After This sheet gives you information about how to care for yourself after your procedure. Your health care provider may also give you more specific instructions. If you have problems or questions, contact your health care provider. What can I expect after the procedure? After the procedure, it is common to have:  A small amount of blood in your stool for 24 hours after the procedure.  Some gas.  Mild cramping or bloating of your abdomen. Follow these instructions at home: Eating and drinking  Drink enough fluid to keep your urine pale yellow.  Follow instructions from your health care provider about eating or drinking restrictions.  Resume your normal diet as instructed by your health care provider. Avoid heavy or fried foods that are hard to digest.   Activity  Rest as told by your health care provider.  Avoid sitting for a long time without moving. Get up to take short walks every 1-2 hours. This is important to improve blood flow and breathing. Ask for help if you feel weak or unsteady.  Return to your normal activities as told by your health care provider. Ask your health care provider what activities are safe for you. Managing cramping and bloating  Try walking around when you have cramps or feel bloated.  Apply heat to your abdomen as told by your health care provider. Use the heat source that your health care provider recommends, such as a moist heat pack or a heating pad. ? Place a towel between your skin and the heat source. ? Leave the heat on for 20-30 minutes. ? Remove the heat if your skin turns bright red. This is especially important if you are unable to feel pain, heat, or cold. You may have a greater risk of getting burned.   General  instructions  If you were given a sedative during the procedure, it can affect you for several hours. Do not drive or operate machinery until your health care provider says that it is safe.  For the first 24 hours after the procedure: ? Do not sign important documents. ? Do not drink alcohol. ? Do your regular daily activities at a slower pace than normal. ? Eat soft foods that are easy to digest.  Take over-the-counter and prescription medicines only as told by your health care provider.  Keep all follow-up visits as told by your health care provider. This is important. Contact a health care provider if:  You have blood in your stool 2-3 days after the procedure. Get help right away if you have:  More than a small spotting of blood in your stool.  Large blood clots in your stool.  Swelling of your abdomen.  Nausea or vomiting.  A fever.  Increasing pain in your abdomen that is not relieved with medicine. Summary  After the procedure, it is common to have a small amount of blood in your stool. You may also have mild cramping and bloating of your abdomen.  If you were given a sedative during the procedure, it can affect you for several hours. Do not drive or operate machinery until your health care provider says that it is safe.  Get help right away if you have a lot of blood in your stool, nausea or vomiting, a fever, or increased  pain in your abdomen. This information is not intended to replace advice given to you by your health care provider. Make sure you discuss any questions you have with your health care provider. Document Revised: 06/19/2019 Document Reviewed: 01/19/2019 Elsevier Patient Education  [place].

## 2020-10-28 NOTE — Transfer of Care (Signed)
Immediate Anesthesia Transfer of Care Note  Patient: Marissa [name]  Procedure(s) Performed: COLONOSCOPY WITH PROPOFOL (N/A ) POLYPECTOMY  Patient Location: Short Stay  Anesthesia Type:General  Level of Consciousness: awake  Airway & Oxygen Therapy: Patient Spontanous Breathing  Post-op Assessment: Report given to RN  Post vital signs: Reviewed  Last Vitals:  Vitals Value Taken Time  BP    Temp    Pulse    Resp    SpO2      Last Pain:  Vitals:   10/28/20 1044  PainSc: 0-No pain         Complications: No complications documented.

## 2020-10-28 NOTE — Anesthesia Postprocedure Evaluation (Signed)
Anesthesia Post Note  Patient: Marissa [name]  Procedure(s) Performed: COLONOSCOPY WITH PROPOFOL (N/A ) POLYPECTOMY  Patient location during evaluation: Short Stay Anesthesia Type: General Level of consciousness: awake and alert and oriented Pain management: pain level controlled Vital Signs Assessment: post-procedure vital signs reviewed and stable Respiratory status: spontaneous breathing Cardiovascular status: stable and blood pressure returned to baseline Postop Assessment: no apparent nausea or vomiting Anesthetic complications: no   No complications documented.   Last Vitals: There were no vitals filed for this visit.  Last Pain:  Vitals:   10/28/20 1044  PainSc: 0-No pain                 Leyla Soliz

## 2020-10-28 NOTE — Op Note (Signed)
[facility] Patient Name: Marissa [name] Procedure Date: 10/28/2020 10:26 AM MRN: [id number] Date of Birth: [birth year] Attending MD: Maylon Peppers ,  CSN: [id number] Age: 75 Admit Type: Outpatient Procedure:                Colonoscopy Indications:              Screening for colorectal malignant neoplasm Providers:                Maylon Peppers, [place] Sharon Seller, RN, Raphael Gibney, Technician Referring MD:              Medicines:                Monitored Anesthesia Care Complications:            No immediate complications. Estimated Blood Loss:     Estimated blood loss: none. Procedure:                Pre-Anesthesia Assessment:                           - Prior to the procedure, a History and Physical                            was performed, and patient medications, allergies                            and sensitivities were reviewed. The patient's                            tolerance of previous anesthesia was reviewed.                           - The risks and benefits of the procedure and the                            sedation options and risks were discussed with the                            patient. All questions were answered and informed                            consent was obtained.                           - ASA Grade Assessment: II - A patient with mild                            systemic disease.                           After obtaining informed consent, the colonoscope                            was passed under direct vision. Throughout the  procedure, the patient's blood pressure, pulse, and                            oxygen saturations were monitored continuously.The                            colonoscopy was performed without difficulty. The                            patient tolerated the procedure well. The quality                            of the bowel preparation was adequate. The                             [id number]-[id number] ([id number]) scope was introduced through                            the anus and advanced to the the terminal ileum. Scope In: 10:48:55 AM Scope Out: 11:12:51 AM Scope Withdrawal Time: 0 hours 20 minutes 1 second  Total Procedure Duration: 0 hours 23 minutes 56 seconds  Findings:      The terminal ileum appeared normal.      A 5 mm polyp was found in the transverse colon. The polyp was sessile.       The polyp was removed with a cold snare. Resection and retrieval were       complete.      Multiple small and large-mouthed diverticula were found in the sigmoid       colon, descending colon and ascending colon.      Non-bleeding internal hemorrhoids were found during retroflexion. The       hemorrhoids were small. Impression:               - The examined portion of the ileum was normal.                           - One 5 mm polyp in the transverse colon, removed                            with a cold snare. Resected and retrieved.                           - Diverticulosis in the sigmoid colon, in the                            descending colon and in the ascending colon.                           - Non-bleeding internal hemorrhoids. Moderate Sedation:      Per Anesthesia Care Recommendation:           - Discharge patient to home (ambulatory).                           - Resume previous diet.                           -  Await pathology results.                           - Repeat colonoscopy for surveillance based on                            pathology results. Procedure Code(s):        --- Professional ---                           [phone], Colonoscopy, flexible; with removal of                            tumor(s), polyp(s), or other lesion(s) by snare                            technique Diagnosis Code(s):        --- Professional ---                           Z12.11, Encounter for screening for malignant                            neoplasm of colon                            K63.5, Polyp of colon                           K64.8, Other hemorrhoids                           K57.30, Diverticulosis of large intestine without                            perforation or abscess without bleeding CPT copyright 2019 American Medical Association. All rights reserved. The codes documented in this report are preliminary and upon coder review may  be revised to meet current compliance requirements. Maylon Peppers, MD Maylon Peppers,  10/28/2020 11:22:33 AM This report has been signed electronically. Number of Addenda: 0

## 2020-10-31 ENCOUNTER — Other Ambulatory Visit (HOSPITAL_COMMUNITY): Payer: Self-pay

## 2020-10-31 DIAGNOSIS — Z170 Estrogen receptor positive status [ER+]: Secondary | ICD-10-CM

## 2020-10-31 DIAGNOSIS — E559 Vitamin D deficiency, unspecified: Secondary | ICD-10-CM

## 2020-10-31 LAB — SURGICAL PATHOLOGY

## 2020-10-31 NOTE — Progress Notes (Signed)
Orders placed for CBCd, CMP, CA15-3 and Vit D per Dr. Delton Coombes to be drawn at [facility] per patient preference

## 2020-11-01 ENCOUNTER — Encounter (INDEPENDENT_AMBULATORY_CARE_PROVIDER_SITE_OTHER): Payer: Self-pay | Admitting: *Deleted

## 2020-11-02 ENCOUNTER — Encounter (HOSPITAL_COMMUNITY): Payer: Self-pay | Admitting: Gastroenterology

## 2020-11-03 DIAGNOSIS — E559 Vitamin D deficiency, unspecified: Secondary | ICD-10-CM | POA: Diagnosis not present

## 2020-11-03 DIAGNOSIS — Z170 Estrogen receptor positive status [ER+]: Secondary | ICD-10-CM | POA: Diagnosis not present

## 2020-11-03 DIAGNOSIS — C50411 Malignant neoplasm of upper-outer quadrant of right female breast: Secondary | ICD-10-CM | POA: Diagnosis not present

## 2020-11-15 ENCOUNTER — Inpatient Hospital Stay (HOSPITAL_COMMUNITY): Payer: Medicare Other | Attending: Hematology | Admitting: Hematology

## 2020-11-15 ENCOUNTER — Encounter (HOSPITAL_COMMUNITY): Payer: Self-pay | Admitting: Hematology

## 2020-11-15 ENCOUNTER — Other Ambulatory Visit: Payer: Self-pay

## 2020-11-15 ENCOUNTER — Other Ambulatory Visit (HOSPITAL_COMMUNITY): Payer: Self-pay | Admitting: Surgery

## 2020-11-15 VITALS — BP 142/53 | HR 57 | Temp 98.1°F | Resp 16 | Wt 226.8 lb

## 2020-11-15 DIAGNOSIS — Z79811 Long term (current) use of aromatase inhibitors: Secondary | ICD-10-CM | POA: Diagnosis not present

## 2020-11-15 DIAGNOSIS — Z170 Estrogen receptor positive status [ER+]: Secondary | ICD-10-CM | POA: Diagnosis not present

## 2020-11-15 DIAGNOSIS — M8580 Other specified disorders of bone density and structure, unspecified site: Secondary | ICD-10-CM | POA: Insufficient documentation

## 2020-11-15 DIAGNOSIS — C50411 Malignant neoplasm of upper-outer quadrant of right female breast: Secondary | ICD-10-CM | POA: Insufficient documentation

## 2020-11-15 MED ORDER — ANASTROZOLE 1 MG PO TABS: 1.0000 mg | ORAL_TABLET | Freq: Every day | ORAL | 3 refills | Status: DC

## 2020-11-15 NOTE — Progress Notes (Signed)
[place], [place] [address]   Patient Care Team: Monico Blitz, MD as PCP - General (Internal Medicine)  SUMMARY OF ONCOLOGIC HISTORY: Oncology History  Carcinoma of upper-outer quadrant of right breast in female, estrogen receptor positive ([place])  06/11/2017 Initial Diagnosis   Carcinoma of upper-outer quadrant of right breast in female, estrogen receptor positive ([place])   07/08/2017 Genetic Testing   Negative genetic testing on the Multi-cancer panel.  The Multi-Gene Panel offered by [facility] includes sequencing and/or deletion duplication testing of the following 83 genes: ALK, APC, ATM, AXIN2,BAP1,  BARD1, BLM, BMPR1A, BRCA1, BRCA2, BRIP1, CASR, CDC73, CDH1, CDK4, CDKN1B, CDKN1C, CDKN2A (p14ARF), CDKN2A (p16INK4a), CEBPA, CHEK2, CTNNA1, DICER1, DIS3L2, EGFR (c.2369C>T, p.Thr790Met variant only), EPCAM (Deletion/duplication testing only), FH, FLCN, GATA2, GPC3, GREM1 (Promoter region deletion/duplication testing only), HOXB13 (c.251G>A, p.Gly84Glu), HRAS, KIT, MAX, MEN1, MET, MITF (c.952G>A, p.Glu318Lys variant only), MLH1, MSH2, MSH3, MSH6, MUTYH, NBN, NF1, NF2, NTHL1, PALB2, PDGFRA, PHOX2B, PMS2, POLD1, POLE, POT1, PRKAR1A, PTCH1, PTEN, RAD50, RAD51C, RAD51D, RB1, RECQL4, RET, RUNX1, SDHAF2, SDHA (sequence changes only), SDHB, SDHC, SDHD, SMAD4, SMARCA4, SMARCB1, SMARCE1, STK11, SUFU, TERT, TERT, TMEM127, TP53, TSC1, TSC2, VHL, WRN and WT1.  The report date is July 08, 2017.      CHIEF COMPLIANT: Follow up for right breast cancer.   INTERVAL HISTORY: [name] is a 75 y.o. female here today for follow up of her right breast cancer. Her last visit was on 05/19/2020.   Today she reports feeling well. She reports occasional hot flashes, but denies aches or pains. She's has been taking Ezetimibe for 2 months. She has been taking vitamin D 5000 units. She reports excellent appetite. She reports a cough at night.  REVIEW OF SYSTEMS:   Review  of Systems  Constitutional: Negative for appetite change and fatigue.  Respiratory: Positive for cough (at night).   Endocrine: Positive for hot flashes.  Musculoskeletal: Negative for arthralgias and myalgias.  Neurological: Positive for numbness (hands).  All other systems reviewed and are negative.   I have reviewed the past medical history, past surgical history, social history and family history with the patient and they are unchanged from previous note.   ALLERGIES:   is allergic to nitrostat [nitroglycerin].   MEDICATIONS:  Current Outpatient Medications  Medication Sig Dispense Refill  . amLODipine (NORVASC) 10 MG tablet Take 10 mg by mouth daily.    Marland Kitchen anastrozole (ARIMIDEX) 1 MG tablet Take 1 tablet (1 mg total) by mouth daily. 90 tablet 3  . aspirin EC 81 MG tablet Take 81 mg by mouth daily.    . Cholecalciferol (VITAMIN D) 125 MCG (5000 UT) CAPS Take 5,000 Units by mouth daily.    . Coenzyme Q10 200 MG capsule Take 200 mg by mouth daily.    . diclofenac sodium (VOLTAREN) 1 % GEL Apply 2 g topically 4 (four) times daily as needed. (Patient taking differently: Apply 2 g topically 4 (four) times daily as needed (pain).) 100 g 0  . ezetimibe (ZETIA) 10 MG tablet Take 10 mg by mouth daily.    Javier Docker Oil 500 MG CAPS Take 500 mg by mouth daily.    Marland Kitchen losartan-hydrochlorothiazide (HYZAAR) 100-25 MG per tablet Take 1 tablet by mouth every morning.     . metoprolol succinate (TOPROL-XL) 25 MG 24 hr tablet Take 25 mg by mouth every morning.     Marland Kitchen omeprazole (PRILOSEC) 20 MG capsule Take 20 mg by mouth daily.  No current facility-administered medications for this visit.     PHYSICAL EXAMINATION: Performance status (ECOG): 1 - Symptomatic but completely ambulatory  Vitals:   11/15/20 0812 11/15/20 0822  BP: (!) 137/36 (!) 142/53  Pulse: (!) 57   Resp: 16   Temp: 98.1 F (36.7 C)   SpO2: 99%    Wt Readings from Last 3 Encounters:  11/15/20 226 lb 12.8 oz (102.9 kg)   10/26/20 225 lb (102.1 kg)  05/19/20 231 lb 8 oz (105 kg)   Physical Exam Vitals reviewed.  Constitutional:      Appearance: Normal appearance. She is obese.  Cardiovascular:     Rate and Rhythm: Normal rate and regular rhythm.     Pulses: Normal pulses.     Heart sounds: Normal heart sounds.  Pulmonary:     Effort: Pulmonary effort is normal.     Breath sounds: Normal breath sounds.  Chest:  Breasts:     Right: Normal. No swelling, bleeding, inverted nipple, mass, nipple discharge, skin change or tenderness.     Left: Normal. No swelling, bleeding, inverted nipple, mass, nipple discharge, skin change or tenderness.    Abdominal:     Palpations: Abdomen is soft. There is no hepatomegaly, splenomegaly or mass.     Tenderness: There is no abdominal tenderness.  Musculoskeletal:     Right lower leg: No edema.     Left lower leg: No edema.  Neurological:     General: No focal deficit present.     Mental Status: She is alert and oriented to person, place, and time.  Psychiatric:        Mood and Affect: Mood normal.        Behavior: Behavior normal.     Breast Exam Chaperone: Milinda Antis, MD     LABORATORY DATA:  I have reviewed the data as listed CMP Latest Ref Rng & Units 10/26/2020 05/12/2020 11/10/2019  Glucose 70 - 99 mg/dL 83 88 100(H)  BUN 8 - 23 mg/dL 27(H) 24(H) 24(H)  Creatinine 0.44 - 1.00 mg/dL 0.96 0.96 0.96  Sodium 135 - 145 mmol/L 139 135 139  Potassium 3.5 - 5.1 mmol/L 3.9 3.8 4.2  Chloride 98 - 111 mmol/L 105 101 101  CO2 22 - 32 mmol/L 26 25 27   Calcium 8.9 - 10.3 mg/dL 9.8 10.2 10.2  Total Protein 6.5 - 8.1 g/dL - 7.4 7.3  Total Bilirubin 0.3 - 1.2 mg/dL - 0.9 0.7  Alkaline Phos 38 - 126 U/L - 91 109  AST 15 - 41 U/L - 22 17  ALT 0 - 44 U/L - 20 18   Lab Results  Component Value Date   CAN153 7.4 05/12/2020   CAN153 7.1 11/10/2019   CAN153 6.6 05/13/2019   Lab Results  Component Value Date   WBC 6.5 05/12/2020   HGB 14.2 05/12/2020    HCT 44.2 05/12/2020   MCV 94.8 05/12/2020   PLT 229 05/12/2020   NEUTROABS 4.4 05/12/2020    ASSESSMENT:  1. Right breast infiltrating ductal carcinoma, ER/PR positive HER-2 negative: -Lumpectomy on 06/11/2017, 1.4 cm, 0/2 sentinel lymph nodes positive, margins negative, ER/PR positive, HER-2 negative, Ki-67 5%. -Oncotype DX recurrence score of 10. Negative genetic testing for hereditary breast cancer. -Radiation therapy finished in January 2019. Anastrozole started on 09/06/2017. -Mammogram from 05/16/2020 was BI-RADS Category 2.  2. Osteopenia: -DEXA scan at Dr. Trena Platt office on 08/07/2018 shows T score -1.4.  3. Right ovarian cyst and left renal lesion: -Recent pelvic  ultrasound on 04/17/2019 shows right ovary entirely occupied by a simple cyst measuring 3.3 x 2.4 x 1.7 cm. This has benign characteristics. CA-125 done by Dr. Manuella Ghazi on 05/13/2019 was 6.1. -Ultrasound of the abdomen on 04/17/2019 shows multiple parapelvic renal cysts bilaterally with cortical thinning and increased echogenicity. Upper pole left renal cyst similar to that visualized on prior CT scan. Fatty infiltration of the liver present.   PLAN:  1. Right breast infiltrating ductal carcinoma, ER/PR positive HER-2 negative: -Lumpectomy scar in the upper outer quadrant is stable.  No palpable masses in bilateral breast. - Last mammogram on 05/16/2020, BI-RADS Category 1. - Continue anastrozole.  She is tolerating without any major issues. - Reviewed labs from [place] which showed elevated alkaline phosphatase.  Other LFTs are normal. - She reported starting on ezetimibe 2 months ago.  This is likely contributing to her elevated alkaline phosphatase. - She will follow-up with Dr. Manuella Ghazi.  We will see her back in 6 months for follow-up.  2. Osteopenia: -Continue vitamin D 5000 units daily. - We will repeat vitamin D level at next visit. - We will plan to repeat DEXA scan next year.  Breast Cancer therapy associated  bone loss: I have recommended calcium, Vitamin D and weight bearing exercises.  Orders placed this encounter:  No orders of the defined types were placed in this encounter.   The patient has a good understanding of the overall plan. She agrees with it. She will call with any problems that may develop before the next visit here.  Derek Jack, MD [place] [phone]   I, Thana Ates, am acting as a scribe for Dr. Sanda Linger.  I, Derek Jack MD, have reviewed the above documentation for accuracy and completeness, and I agree with the above.

## 2020-11-15 NOTE — Progress Notes (Signed)
Pt is taking Anastrazole as prescribed with no side effects. 

## 2020-11-15 NOTE — Patient Instructions (Signed)
[facility] Discharge Instructions  You were seen today by Dr. Katragadda. He went over your recent results. Dr. Katragadda will see you back in 6 months for labs and follow up.   Thank you for choosing [facility] at [facility] to provide your oncology and hematology care.  To afford each patient quality time with our provider, please arrive at least 15 minutes before your scheduled appointment time.   If you have a lab appointment with the Cancer Center please come in thru the Main Entrance and check in at the main information desk  You need to re-schedule your appointment should you arrive 10 or more minutes late.  We strive to give you quality time with our providers, and arriving late affects you and other patients whose appointments are after yours.  Also, if you no show three or more times for appointments you may be dismissed from the clinic at the providers discretion.     Again, thank you for choosing [facility].  Our hope is that these requests will decrease the amount of time that you wait before being seen by our physicians.       _____________________________________________________________  Should you have questions after your visit to  Cancer Center, please contact our office at [phone] between the hours of 8:00 a.m. and 4:30 p.m.  Voicemails left after 4:00 p.m. will not be returned until the following business day.  For prescription refill requests, have your pharmacy contact our office and allow 72 hours.    Cancer Center Support Programs:   > Cancer Support Group  2nd Tuesday of the month 1pm-2pm, Journey Room    

## 2020-12-01 DIAGNOSIS — Z6838 Body mass index (BMI) 38.0-38.9, adult: Secondary | ICD-10-CM | POA: Diagnosis not present

## 2020-12-01 DIAGNOSIS — Z853 Personal history of malignant neoplasm of breast: Secondary | ICD-10-CM | POA: Diagnosis not present

## 2020-12-01 DIAGNOSIS — J069 Acute upper respiratory infection, unspecified: Secondary | ICD-10-CM | POA: Diagnosis not present

## 2020-12-01 DIAGNOSIS — Z6839 Body mass index (BMI) 39.0-39.9, adult: Secondary | ICD-10-CM | POA: Diagnosis not present

## 2020-12-01 DIAGNOSIS — Z713 Dietary counseling and surveillance: Secondary | ICD-10-CM | POA: Diagnosis not present

## 2020-12-01 DIAGNOSIS — Z299 Encounter for prophylactic measures, unspecified: Secondary | ICD-10-CM | POA: Diagnosis not present

## 2020-12-14 DIAGNOSIS — Z6837 Body mass index (BMI) 37.0-37.9, adult: Secondary | ICD-10-CM | POA: Diagnosis not present

## 2020-12-14 DIAGNOSIS — Z713 Dietary counseling and surveillance: Secondary | ICD-10-CM | POA: Diagnosis not present

## 2020-12-14 DIAGNOSIS — E7800 Pure hypercholesterolemia, unspecified: Secondary | ICD-10-CM | POA: Diagnosis not present

## 2020-12-14 DIAGNOSIS — I10 Essential (primary) hypertension: Secondary | ICD-10-CM | POA: Diagnosis not present

## 2020-12-14 DIAGNOSIS — Z299 Encounter for prophylactic measures, unspecified: Secondary | ICD-10-CM | POA: Diagnosis not present

## 2021-02-05 DIAGNOSIS — E7800 Pure hypercholesterolemia, unspecified: Secondary | ICD-10-CM | POA: Diagnosis not present

## 2021-02-05 DIAGNOSIS — I10 Essential (primary) hypertension: Secondary | ICD-10-CM | POA: Diagnosis not present

## 2021-03-08 DIAGNOSIS — E7800 Pure hypercholesterolemia, unspecified: Secondary | ICD-10-CM | POA: Diagnosis not present

## 2021-03-08 DIAGNOSIS — I10 Essential (primary) hypertension: Secondary | ICD-10-CM | POA: Diagnosis not present

## 2021-03-16 DIAGNOSIS — Z6837 Body mass index (BMI) 37.0-37.9, adult: Secondary | ICD-10-CM | POA: Diagnosis not present

## 2021-03-16 DIAGNOSIS — Z299 Encounter for prophylactic measures, unspecified: Secondary | ICD-10-CM | POA: Diagnosis not present

## 2021-03-16 DIAGNOSIS — E7800 Pure hypercholesterolemia, unspecified: Secondary | ICD-10-CM | POA: Diagnosis not present

## 2021-03-16 DIAGNOSIS — Z713 Dietary counseling and surveillance: Secondary | ICD-10-CM | POA: Diagnosis not present

## 2021-03-16 DIAGNOSIS — I10 Essential (primary) hypertension: Secondary | ICD-10-CM | POA: Diagnosis not present

## 2021-04-04 DIAGNOSIS — H43813 Vitreous degeneration, bilateral: Secondary | ICD-10-CM | POA: Diagnosis not present

## 2021-04-07 DIAGNOSIS — I10 Essential (primary) hypertension: Secondary | ICD-10-CM | POA: Diagnosis not present

## 2021-04-07 DIAGNOSIS — E7800 Pure hypercholesterolemia, unspecified: Secondary | ICD-10-CM | POA: Diagnosis not present

## 2021-04-17 DIAGNOSIS — Z23 Encounter for immunization: Secondary | ICD-10-CM | POA: Diagnosis not present

## 2021-05-19 ENCOUNTER — Ambulatory Visit
Admission: RE | Admit: 2021-05-19 | Discharge: 2021-05-19 | Disposition: A | Discharge status: Home / Self Care | Payer: Medicare Other | Source: Ambulatory Visit | Attending: Hematology | Admitting: Hematology

## 2021-05-19 ENCOUNTER — Other Ambulatory Visit: Payer: Self-pay

## 2021-05-19 DIAGNOSIS — R922 Inconclusive mammogram: Secondary | ICD-10-CM | POA: Diagnosis not present

## 2021-05-19 DIAGNOSIS — Z170 Estrogen receptor positive status [ER+]: Secondary | ICD-10-CM

## 2021-05-19 DIAGNOSIS — C50411 Malignant neoplasm of upper-outer quadrant of right female breast: Secondary | ICD-10-CM

## 2021-05-23 ENCOUNTER — Other Ambulatory Visit (HOSPITAL_COMMUNITY): Payer: Self-pay

## 2021-05-23 DIAGNOSIS — Z170 Estrogen receptor positive status [ER+]: Secondary | ICD-10-CM

## 2021-05-23 DIAGNOSIS — E559 Vitamin D deficiency, unspecified: Secondary | ICD-10-CM

## 2021-05-23 DIAGNOSIS — C50411 Malignant neoplasm of upper-outer quadrant of right female breast: Secondary | ICD-10-CM

## 2021-05-23 NOTE — Progress Notes (Signed)
Orders placed for [facility] CBCd, CMP, CA15-3 and VitD.

## 2021-05-25 ENCOUNTER — Ambulatory Visit (HOSPITAL_COMMUNITY): Payer: Medicare Other | Admitting: Hematology

## 2021-05-29 DIAGNOSIS — E559 Vitamin D deficiency, unspecified: Secondary | ICD-10-CM | POA: Diagnosis not present

## 2021-05-29 DIAGNOSIS — Z170 Estrogen receptor positive status [ER+]: Secondary | ICD-10-CM | POA: Diagnosis not present

## 2021-05-29 DIAGNOSIS — C50411 Malignant neoplasm of upper-outer quadrant of right female breast: Secondary | ICD-10-CM | POA: Diagnosis not present

## 2021-06-06 NOTE — Progress Notes (Signed)
[address], [place] [address]   Patient Care Team: Monico Blitz, MD as PCP - General (Internal Medicine)  SUMMARY OF ONCOLOGIC HISTORY: Oncology History  Carcinoma of upper-outer quadrant of right breast in female, estrogen receptor positive ([place])  06/11/2017 Initial Diagnosis   Carcinoma of upper-outer quadrant of right breast in female, estrogen receptor positive ([place])   07/08/2017 Genetic Testing   Negative genetic testing on the Multi-cancer panel.  The Multi-Gene Panel offered by [facility] includes sequencing and/or deletion duplication testing of the following 83 genes: ALK, APC, ATM, AXIN2,BAP1,  BARD1, BLM, BMPR1A, BRCA1, BRCA2, BRIP1, CASR, CDC73, CDH1, CDK4, CDKN1B, CDKN1C, CDKN2A (p14ARF), CDKN2A (p16INK4a), CEBPA, CHEK2, CTNNA1, DICER1, DIS3L2, EGFR (c.2369C>T, p.Thr790Met variant only), EPCAM (Deletion/duplication testing only), FH, FLCN, GATA2, GPC3, GREM1 (Promoter region deletion/duplication testing only), HOXB13 (c.251G>A, p.Gly84Glu), HRAS, KIT, MAX, MEN1, MET, MITF (c.952G>A, p.Glu318Lys variant only), MLH1, MSH2, MSH3, MSH6, MUTYH, NBN, NF1, NF2, NTHL1, PALB2, PDGFRA, PHOX2B, PMS2, POLD1, POLE, POT1, PRKAR1A, PTCH1, PTEN, RAD50, RAD51C, RAD51D, RB1, RECQL4, RET, RUNX1, SDHAF2, SDHA (sequence changes only), SDHB, SDHC, SDHD, SMAD4, SMARCA4, SMARCB1, SMARCE1, STK11, SUFU, TERT, TERT, TMEM127, TP53, TSC1, TSC2, VHL, WRN and WT1.  The report date is July 08, 2017.      CHIEF COMPLIANT: Follow up for right breast cancer   INTERVAL HISTORY: Ms. BEN SANZ is a 75 y.o. female here today for follow up of her right breast cancer. Her last visit was on 11/15/2020.   Today she reports feeling good. She is taking anastrozole and tolerating it well. She reports pain in her right shoulder which started 1 month ago, is worst in the evenings, and improves with heat and movement. She reports back pain which is worst in the morning, and she  reports she is unable to straighten her back in the mornings. She takes vitamin D over the counter, but she does not take calcium.  REVIEW OF SYSTEMS:   Review of Systems  Constitutional:  Negative for appetite change and fatigue (60%).  Musculoskeletal:  Positive for arthralgias (4/10 R shoulder) and back pain.  Neurological:  Positive for numbness.  All other systems reviewed and are negative.  I have reviewed the past medical history, past surgical history, social history and family history with the patient and they are unchanged from previous note.   ALLERGIES:   is allergic to nitrostat [nitroglycerin].   MEDICATIONS:  Current Outpatient Medications  Medication Sig Dispense Refill   amLODipine (NORVASC) 10 MG tablet Take 10 mg by mouth daily.     aspirin EC 81 MG tablet Take 81 mg by mouth daily.     Cholecalciferol (VITAMIN D) 125 MCG (5000 UT) CAPS Take 5,000 Units by mouth daily.     Coenzyme Q10 200 MG capsule Take 200 mg by mouth daily.     Krill Oil 500 MG CAPS Take 500 mg by mouth daily.     losartan-hydrochlorothiazide (HYZAAR) 100-25 MG per tablet Take 1 tablet by mouth every morning.      metoprolol succinate (TOPROL-XL) 25 MG 24 hr tablet Take 25 mg by mouth every morning.      omeprazole (PRILOSEC) 20 MG capsule Take 20 mg by mouth daily.     anastrozole (ARIMIDEX) 1 MG tablet Take 1 tablet (1 mg total) by mouth daily. 90 tablet 3   diclofenac sodium (VOLTAREN) 1 % GEL Apply 2 g topically 4 (four) times daily as needed. (Patient not taking: Reported on 06/07/2021) 100 g  0   No current facility-administered medications for this visit.     PHYSICAL EXAMINATION: Performance status (ECOG): 1 - Symptomatic but completely ambulatory  Vitals:   06/07/21 0757  BP: 136/62  Pulse: 64  Resp: 18  Temp: (!) 96.9 F (36.1 C)  SpO2: 100%   Wt Readings from Last 3 Encounters:  06/07/21 216 lb 6.4 oz (98.2 kg)  11/15/20 226 lb 12.8 oz (102.9 kg)  10/26/20 225 lb  (102.1 kg)   Physical Exam Vitals reviewed.  Constitutional:      Appearance: Normal appearance.  Cardiovascular:     Rate and Rhythm: Normal rate and regular rhythm.     Pulses: Normal pulses.     Heart sounds: Normal heart sounds.  Pulmonary:     Effort: Pulmonary effort is normal.     Breath sounds: Normal breath sounds.  Chest:  Breasts:    Right: Normal. No swelling, bleeding, inverted nipple, mass, nipple discharge, skin change (UOQ lumpectomy scar well healed) or tenderness.     Left: Normal. No swelling, bleeding, inverted nipple, mass, nipple discharge, skin change or tenderness.  Abdominal:     Palpations: Abdomen is soft. There is no hepatomegaly, splenomegaly or mass.     Tenderness: There is no abdominal tenderness.  Lymphadenopathy:     Upper Body:     Right upper body: No supraclavicular adenopathy.     Left upper body: No supraclavicular adenopathy.  Neurological:     General: No focal deficit present.     Mental Status: She is alert and oriented to person, place, and time.  Psychiatric:        Mood and Affect: Mood normal.        Behavior: Behavior normal.    Breast Exam Chaperone: Thana Ates     LABORATORY DATA:  I have reviewed the data as listed CMP Latest Ref Rng & Units 10/26/2020 05/12/2020 11/10/2019  Glucose 70 - 99 mg/dL 83 88 100(H)  BUN 8 - 23 mg/dL 27(H) 24(H) 24(H)  Creatinine 0.44 - 1.00 mg/dL 0.96 0.96 0.96  Sodium 135 - 145 mmol/L 139 135 139  Potassium 3.5 - 5.1 mmol/L 3.9 3.8 4.2  Chloride 98 - 111 mmol/L 105 101 101  CO2 22 - 32 mmol/L _0 Calcium 8.9 - 10.3 mg/dL 9.8 10.2 10.2  Total Protein 6.5 - 8.1 g/dL - 7.4 7.3  Total Bilirubin 0.3 - 1.2 mg/dL - 0.9 0.7  Alkaline Phos 38 - 126 U/L - 91 109  AST 15 - 41 U/L - 22 17  ALT 0 - 44 U/L - 20 18   Lab Results  Component Value Date   CAN153 7.4 05/12/2020   CAN153 7.1 11/10/2019   CAN153 6.6 05/13/2019   Lab Results  Component Value Date   WBC 6.5 05/12/2020   HGB 14.2  05/12/2020   HCT 44.2 05/12/2020   MCV 94.8 05/12/2020   PLT 229 05/12/2020   NEUTROABS 4.4 05/12/2020    ASSESSMENT:  1.  Right breast infiltrating ductal carcinoma, ER/PR positive HER-2 negative: -Lumpectomy on 06/11/2017, 1.4 cm, 0/2 sentinel lymph nodes positive, margins negative, ER/PR positive, HER-2 negative, Ki-67 5%. -Oncotype DX recurrence score of 10.  Negative genetic testing for hereditary breast cancer. -Radiation therapy finished in January 2019.  Anastrozole started on 09/06/2017. -Mammogram from 05/16/2020 was BI-RADS Category 2.   2.  Osteopenia: -DEXA scan at Dr. Trena Platt office on 08/07/2018 shows T score -1.4.   3.  Right ovarian cyst  and left renal lesion: -Recent pelvic ultrasound on 04/17/2019 shows right ovary entirely occupied by a simple cyst measuring 3.3 x 2.4 x 1.7 cm.  This has benign characteristics.  CA-125 done by Dr. Manuella Ghazi on 05/13/2019 was 6.1. -Ultrasound of the abdomen on 04/17/2019 shows multiple parapelvic renal cysts bilaterally with cortical thinning and increased echogenicity.  Upper pole left renal cyst similar to that visualized on prior CT scan.  Fatty infiltration of the liver present.   PLAN:  1.  Right breast infiltrating ductal carcinoma, ER/PR positive HER-2 negative: - Lumpectomy scar in the upper outer quadrant of the right breast is stable.  No palpable masses in bilateral breast.  No palpable adenopathy. - Reviewed mammogram from 05/19/2021 which was BI-RADS Category 2. - She is tolerating anastrozole reasonably well.  She has occasional hot flashes at nighttime. - She reported right shoulder pain and scapular pain for the last 1 month.  She is applying Bengay. - Reviewed labs from [facility] dated 05/29/2021 which showed normal CBC.  CMP shows creatinine 0.9, calcium of 10.4 (8.7-10.3).  Alk phos is 135 (44-1 21). - CA 15-3 was 7.1. - Elevated alkaline phosphatase was thought to be secondary to ezetimibe.  She discontinued ezetimibe around  05/31/2021.  Labs were done on 05/29/2021.  We will follow-up on alkaline phosphatase at next time. - As she is complaining of new onset right scapular and shoulder pain, I have recommended x-rays.  I will review them as soon as they are done. - RTC 6 months for follow-up.   2.  Osteopenia: - Vitamin D level is 53.  Continue vitamin D 5000 units daily.  She is not taking calcium. - Will consider repeating DEXA scan next year.  Breast Cancer therapy associated bone loss: I have recommended calcium, Vitamin D and weight bearing exercises.  Orders placed this encounter:  No orders of the defined types were placed in this encounter.   The patient has a good understanding of the overall plan. She agrees with it. She will call with any problems that may develop before the next visit here.  Derek Jack, MD [facility] [phone]   I, Thana Ates, am acting as a scribe for Dr. Derek Jack.  I, Derek Jack MD, have reviewed the above documentation for accuracy and completeness, and I agree with the above.

## 2021-06-07 ENCOUNTER — Other Ambulatory Visit: Payer: Self-pay

## 2021-06-07 ENCOUNTER — Inpatient Hospital Stay (HOSPITAL_COMMUNITY): Payer: Medicare Other | Attending: Hematology | Admitting: Hematology

## 2021-06-07 ENCOUNTER — Ambulatory Visit (HOSPITAL_COMMUNITY)
Admission: RE | Admit: 2021-06-07 | Discharge: 2021-06-07 | Disposition: A | Discharge status: Home / Self Care | Payer: Medicare Other | Source: Ambulatory Visit | Attending: Hematology | Admitting: Hematology

## 2021-06-07 VITALS — BP 136/62 | HR 64 | Temp 96.9°F | Resp 18 | Wt 216.4 lb

## 2021-06-07 DIAGNOSIS — C50411 Malignant neoplasm of upper-outer quadrant of right female breast: Secondary | ICD-10-CM | POA: Diagnosis not present

## 2021-06-07 DIAGNOSIS — Z170 Estrogen receptor positive status [ER+]: Secondary | ICD-10-CM | POA: Diagnosis not present

## 2021-06-07 DIAGNOSIS — Z79811 Long term (current) use of aromatase inhibitors: Secondary | ICD-10-CM | POA: Insufficient documentation

## 2021-06-07 DIAGNOSIS — M25511 Pain in right shoulder: Secondary | ICD-10-CM | POA: Insufficient documentation

## 2021-06-07 DIAGNOSIS — I10 Essential (primary) hypertension: Secondary | ICD-10-CM | POA: Diagnosis not present

## 2021-06-07 DIAGNOSIS — E559 Vitamin D deficiency, unspecified: Secondary | ICD-10-CM

## 2021-06-07 DIAGNOSIS — M8580 Other specified disorders of bone density and structure, unspecified site: Secondary | ICD-10-CM | POA: Diagnosis not present

## 2021-06-07 DIAGNOSIS — M19011 Primary osteoarthritis, right shoulder: Secondary | ICD-10-CM | POA: Diagnosis not present

## 2021-06-07 DIAGNOSIS — E7800 Pure hypercholesterolemia, unspecified: Secondary | ICD-10-CM | POA: Diagnosis not present

## 2021-06-07 MED ORDER — ANASTROZOLE 1 MG PO TABS: 1.0000 mg | ORAL_TABLET | Freq: Every day | ORAL | 3 refills | Status: DC

## 2021-06-07 NOTE — Patient Instructions (Addendum)
[facility] at [facility] Discharge Instructions   You were seen and examined today by Dr. Delton Coombes. He reviewed your lab results, which are stable/normal. Your calcium level was a little high today; we will monitor.  We will get an x-ray of your right shoulder since you are having pain. Return as scheduled for lab work and office visit.    Thank you for choosing [facility] at [facility] to provide your oncology and hematology care.  To afford each patient quality time with our provider, please arrive at least 15 minutes before your scheduled appointment time.   If you have a lab appointment with the [facility] please come in thru the Main Entrance and check in at the main information desk.  You need to re-schedule your appointment should you arrive 10 or more minutes late.  We strive to give you quality time with our providers, and arriving late affects you and other patients whose appointments are after yours.  Also, if you no show three or more times for appointments you may be dismissed from the clinic at the providers discretion.     Again, thank you for choosing [facility].  Our hope is that these requests will decrease the amount of time that you wait before being seen by our physicians.       _____________________________________________________________  Should you have questions after your visit to [facility], please contact our office at [phone] and follow the prompts.  Our office hours are 8:00 a.m. and 4:30 p.m. Monday - Friday.  Please note that voicemails left after 4:00 p.m. may not be returned until the following business day.  We are closed weekends and major holidays.  You do have access to a nurse 24-7, just call the main number to the clinic [phone] and do not press any options, hold on the line and a nurse will answer the phone.    For prescription refill requests, have your pharmacy  contact our office and allow 72 hours.    Due to Covid, you will need to wear a mask upon entering the hospital. If you do not have a mask, a mask will be given to you at the Main Entrance upon arrival. For doctor visits, patients may have 1 support person age 17 or older with them. For treatment visits, patients can not have anyone with them due to social distancing guidelines and our immunocompromised population.

## 2021-06-13 DIAGNOSIS — M19011 Primary osteoarthritis, right shoulder: Secondary | ICD-10-CM | POA: Diagnosis not present

## 2021-06-13 DIAGNOSIS — S46011D Strain of muscle(s) and tendon(s) of the rotator cuff of right shoulder, subsequent encounter: Secondary | ICD-10-CM | POA: Diagnosis not present

## 2021-08-08 DIAGNOSIS — E7800 Pure hypercholesterolemia, unspecified: Secondary | ICD-10-CM | POA: Diagnosis not present

## 2021-08-08 DIAGNOSIS — I10 Essential (primary) hypertension: Secondary | ICD-10-CM | POA: Diagnosis not present

## 2021-08-11 DIAGNOSIS — E559 Vitamin D deficiency, unspecified: Secondary | ICD-10-CM | POA: Diagnosis not present

## 2021-08-11 DIAGNOSIS — Z0000 Encounter for general adult medical examination without abnormal findings: Secondary | ICD-10-CM | POA: Diagnosis not present

## 2021-08-11 DIAGNOSIS — Z1331 Encounter for screening for depression: Secondary | ICD-10-CM | POA: Diagnosis not present

## 2021-08-11 DIAGNOSIS — Z299 Encounter for prophylactic measures, unspecified: Secondary | ICD-10-CM | POA: Diagnosis not present

## 2021-08-11 DIAGNOSIS — Z79899 Other long term (current) drug therapy: Secondary | ICD-10-CM | POA: Diagnosis not present

## 2021-08-11 DIAGNOSIS — Z1339 Encounter for screening examination for other mental health and behavioral disorders: Secondary | ICD-10-CM | POA: Diagnosis not present

## 2021-08-11 DIAGNOSIS — M549 Dorsalgia, unspecified: Secondary | ICD-10-CM | POA: Diagnosis not present

## 2021-08-11 DIAGNOSIS — R5383 Other fatigue: Secondary | ICD-10-CM | POA: Diagnosis not present

## 2021-08-11 DIAGNOSIS — Z6834 Body mass index (BMI) 34.0-34.9, adult: Secondary | ICD-10-CM | POA: Diagnosis not present

## 2021-08-11 DIAGNOSIS — E7800 Pure hypercholesterolemia, unspecified: Secondary | ICD-10-CM | POA: Diagnosis not present

## 2021-08-11 DIAGNOSIS — Z7189 Other specified counseling: Secondary | ICD-10-CM | POA: Diagnosis not present

## 2021-08-11 DIAGNOSIS — I10 Essential (primary) hypertension: Secondary | ICD-10-CM | POA: Diagnosis not present

## 2021-10-09 ENCOUNTER — Encounter (HOSPITAL_COMMUNITY): Payer: Self-pay | Admitting: Emergency Medicine

## 2021-10-09 ENCOUNTER — Emergency Department (HOSPITAL_COMMUNITY): Payer: Medicare Other

## 2021-10-09 ENCOUNTER — Other Ambulatory Visit (HOSPITAL_COMMUNITY): Payer: Self-pay

## 2021-10-09 ENCOUNTER — Other Ambulatory Visit: Payer: Self-pay

## 2021-10-09 ENCOUNTER — Emergency Department (HOSPITAL_COMMUNITY)
Admission: EM | Admit: 2021-10-09 | Discharge: 2021-10-09 | Disposition: A | Discharge status: Home / Self Care | Payer: Medicare Other | Attending: Emergency Medicine | Admitting: Emergency Medicine

## 2021-10-09 DIAGNOSIS — Z7901 Long term (current) use of anticoagulants: Secondary | ICD-10-CM | POA: Insufficient documentation

## 2021-10-09 DIAGNOSIS — R079 Chest pain, unspecified: Secondary | ICD-10-CM | POA: Diagnosis not present

## 2021-10-09 DIAGNOSIS — Z7982 Long term (current) use of aspirin: Secondary | ICD-10-CM | POA: Diagnosis not present

## 2021-10-09 DIAGNOSIS — Z853 Personal history of malignant neoplasm of breast: Secondary | ICD-10-CM | POA: Insufficient documentation

## 2021-10-09 DIAGNOSIS — I480 Paroxysmal atrial fibrillation: Secondary | ICD-10-CM | POA: Insufficient documentation

## 2021-10-09 LAB — CBC
HCT: 49.1 % — ABNORMAL HIGH (ref 36.0–46.0)
Hemoglobin: 16 g/dL — ABNORMAL HIGH (ref 12.0–15.0)
MCH: 30.7 pg (ref 26.0–34.0)
MCHC: 32.6 g/dL (ref 30.0–36.0)
MCV: 94.2 fL (ref 80.0–100.0)
Platelets: 294 10*3/uL (ref 150–400)
RBC: 5.21 MIL/uL — ABNORMAL HIGH (ref 3.87–5.11)
RDW: 11.6 % (ref 11.5–15.5)
WBC: 9 10*3/uL (ref 4.0–10.5)
nRBC: 0 % (ref 0.0–0.2)

## 2021-10-09 LAB — BASIC METABOLIC PANEL
Anion gap: 12 (ref 5–15)
BUN: 29 mg/dL — ABNORMAL HIGH (ref 8–23)
CO2: 22 mmol/L (ref 22–32)
Calcium: 10.3 mg/dL (ref 8.9–10.3)
Chloride: 104 mmol/L (ref 98–111)
Creatinine, Ser: 1.05 mg/dL — ABNORMAL HIGH (ref 0.44–1.00)
GFR, Estimated: 55 mL/min — ABNORMAL LOW (ref >60)
Glucose, Bld: 109 mg/dL — ABNORMAL HIGH (ref 70–99)
Potassium: 4.4 mmol/L (ref 3.5–5.1)
Sodium: 138 mmol/L (ref 135–145)

## 2021-10-09 LAB — MAGNESIUM: Magnesium: 2.4 mg/dL (ref 1.7–2.4)

## 2021-10-09 LAB — TROPONIN I (HIGH SENSITIVITY)
Troponin I (High Sensitivity): 8 ng/L (ref <18)
Troponin I (High Sensitivity): 8 ng/L (ref <18)

## 2021-10-09 MED ORDER — APIXABAN 5 MG PO TABS: 5.0000 mg | ORAL_TABLET | Freq: Two times a day (BID) | ORAL | 0 refills | Status: DC

## 2021-10-09 MED ORDER — LACTATED RINGERS IV BOLUS
1000.0000 mL | Freq: Once | INTRAVENOUS | Status: AC
  Administered 2021-10-09: 1000 mL via INTRAVENOUS

## 2021-10-09 MED ORDER — DILTIAZEM HCL 25 MG/5ML IV SOLN
10.0000 mg | Freq: Once | INTRAVENOUS | Status: AC
Start: 2021-10-09 — End: 2021-10-09
  Administered 2021-10-09: 10 mg via INTRAVENOUS
  Filled 2021-10-09: qty 5

## 2021-10-09 MED ORDER — PROPOFOL 10 MG/ML IV BOLUS
INTRAVENOUS | Status: AC | PRN
  Administered 2021-10-09 (×3): 25 mg via INTRAVENOUS

## 2021-10-09 MED ORDER — PROPOFOL 10 MG/ML IV BOLUS
0.5000 mg/kg | Freq: Once | INTRAVENOUS | Status: DC
  Filled 2021-10-09: qty 20

## 2021-10-09 MED ORDER — APIXABAN 5 MG PO TABS
5.0000 mg | ORAL_TABLET | Freq: Two times a day (BID) | ORAL | Status: DC
  Administered 2021-10-09: 5 mg via ORAL
  Filled 2021-10-09: qty 1

## 2021-10-09 MED ORDER — ASPIRIN 81 MG PO CHEW
324.0000 mg | CHEWABLE_TABLET | Freq: Once | ORAL | Status: AC
Start: 2021-10-09 — End: 2021-10-09
  Administered 2021-10-09: 324 mg via ORAL
  Filled 2021-10-09: qty 4

## 2021-10-09 NOTE — ED Triage Notes (Signed)
Pt to the ED with complaints of chest pain and shortness of breath that began last night.  ? ?Pt woke up this morning with sharp left chest pain. The pain does not radiate and is intermittent. ? ?

## 2021-10-09 NOTE — Progress Notes (Signed)
ANTICOAGULATION CONSULT NOTE - Initial Consult ? ?Pharmacy Consult for eliquis ?Indication: atrial fibrillation ? ?Allergies  ?Allergen Reactions  ? Nitrostat [Nitroglycerin] Shortness Of Breath  ? ? ?Patient Measurements: ?Height: '5\' 5"'$  (165.1 cm) ?Weight: 98.2 kg (216 lb 7.9 oz) ?IBW/kg (Calculated) : 57 ? ?Vital Signs: ?Temp: 97.7 ?F (36.5 ?C) (04/03 0981) ?Temp Source: Oral (04/03 1914) ?BP: 104/59 (04/03 1021) ?Pulse Rate: 65 (04/03 1021) ? ?Labs: ?Recent Labs  ?  10/09/21 ?0821  ?HGB 16.0*  ?HCT 49.1*  ?PLT 294  ?CREATININE 1.05*  ?TROPONINIHS 8  ? ? ?Estimated Creatinine Clearance: 53.7 mL/min (A) (by C-G formula based on SCr of 1.05 mg/dL (H)). ? ? ?Medical History: ?Past Medical History:  ?Diagnosis Date  ? Anxiety   ? Cancer ([place]) 05/2017  ? right breast cancer  ? Family history of breast cancer   ? Family history of ovarian cancer   ? Family history of stomach cancer   ? GERD (gastroesophageal reflux disease)   ? High cholesterol   ? History of radiation therapy 07/03/17- 07/31/2017  ? Right Breast/ 40/05 Gy in 15 fractions, Right Breast boost/ 10 Gy in 5 fractions.   ? Hypertension   ? Personal history of radiation therapy 2018/2019  ? ? ?Medications:  ?See med rec ? ?Assessment: ?Patient presented with chest pain. Now diagnosed with new onset afib. Cardioverted. Pharmacy asked to start eliquis ? ?Goal of Therapy:  ? ?Monitor platelets by anticoagulation protocol: Yes ?  ?Plan:  ?Eliquis '5mg'$  po bid ?Educate on eliquis ? ?Isac Sarna, BS Pharm D, BCPS ?Clinical Pharmacist ?10/09/2021,10:48 AM ? ? ?

## 2021-10-09 NOTE — ED Provider Notes (Signed)
?[facility] ?Provider Note ? ? ?CSN: [id number] ?Arrival date & time: 10/09/21  2119 ? ?  ? ?History ? ?Chief Complaint  ?Patient presents with  ? Chest Pain  ? ? ?Marissa [name] is a 76 y.o. female. ? ?HPI ?76 year old female presents with chest pain.  Started yesterday evening/afternoon.  Had a combination of sharp left-sided chest pain and palpitations.  Seem to go away last night but then came back this morning.  She is also concomitantly short of breath.  She has a previous history of breast cancer that was treated on the right side.  She denies any known CAD or history of A-fib.  No recent palpitations or issues with this before yesterday.  No recent illness otherwise.  No fevers, cough, diarrhea. ? ?Home Medications ?Prior to Admission medications   ?Medication Sig Start Date End Date Taking? Authorizing Provider  ?apixaban (ELIQUIS) 5 MG TABS tablet Take 1 tablet (5 mg total) by mouth 2 (two) times daily. 10/09/21 11/08/21 Yes Sherwood Gambler, MD  ?amLODipine (NORVASC) 10 MG tablet Take 10 mg by mouth daily.    [provider]  ?anastrozole (ARIMIDEX) 1 MG tablet Take 1 tablet (1 mg total) by mouth daily. 06/07/21   Derek Jack, MD  ?aspirin EC 81 MG tablet Take 81 mg by mouth daily.    [provider]  ?Cholecalciferol (VITAMIN D) 125 MCG (5000 UT) CAPS Take 5,000 Units by mouth daily.    [provider]  ?Coenzyme Q10 200 MG capsule Take 200 mg by mouth daily.    [provider]  ?diclofenac sodium (VOLTAREN) 1 % GEL Apply 2 g topically 4 (four) times daily as needed. ?Patient not taking: Reported on 06/07/2021 04/16/19   Margette Fast, MD  ?Javier Docker Oil 500 MG CAPS Take 500 mg by mouth daily.    [provider]  ?losartan-hydrochlorothiazide (HYZAAR) 100-25 MG per tablet Take 1 tablet by mouth every morning.     [provider]  ?metoprolol succinate (TOPROL-XL) 25 MG 24 hr tablet Take 25 mg by mouth every morning.     [provider]  ?omeprazole (PRILOSEC) 20 MG capsule Take 20 mg by mouth daily.    [provider]  ?   ? ?Allergies    ?Nitrostat [nitroglycerin]   ? ?Review of Systems   ?Review of Systems  ?Constitutional:  Negative for fever.  ?Respiratory:  Positive for shortness of breath. Negative for cough.   ?Cardiovascular:  Positive for chest pain and palpitations.  ?Gastrointestinal:  Negative for diarrhea and vomiting.  ? ?Physical Exam ?Updated Vital Signs ?BP 130/63   Pulse 62   Temp 97.7 ?F (36.5 ?C) (Oral)   Resp 16   Ht '5\' 5"'$  (1.651 m)   Wt 98.2 kg   SpO2 97%   BMI 36.03 kg/m?  ?Physical Exam ?Vitals and nursing note reviewed.  ?Constitutional:   ?   Appearance: She is well-developed. She is not diaphoretic.  ?HENT:  ?   Head: Normocephalic and atraumatic.  ?Cardiovascular:  ?   Rate and Rhythm: Tachycardia present. Rhythm irregular.  ?   Heart sounds: Normal heart sounds.  ?Pulmonary:  ?   Effort: Pulmonary effort is normal.  ?   Breath sounds: Normal breath sounds. No wheezing, rhonchi or rales.  ?Abdominal:  ?   Palpations: Abdomen is soft.  ?   Tenderness: There is no abdominal tenderness.  ?Musculoskeletal:  ?   Right lower leg: No edema.  ?  Left lower leg: No edema.  ?Skin: ?   General: Skin is warm and dry.  ?Neurological:  ?   Mental Status: She is alert.  ? ? ?ED Results / Procedures / Treatments   ?Labs ?(all labs ordered are listed, but only abnormal results are displayed) ?Labs Reviewed  ?BASIC METABOLIC PANEL - Abnormal; Notable for the following components:  ?    Result Value  ? Glucose, Bld 109 (*)   ? BUN 29 (*)   ? Creatinine, Ser 1.05 (*)   ? GFR, Estimated 55 (*)   ? All other components within normal limits  ?CBC - Abnormal; Notable for the following components:  ? RBC 5.21 (*)   ? Hemoglobin 16.0 (*)   ? HCT 49.1 (*)   ? All other components within normal limits  ?MAGNESIUM  ?TROPONIN I (HIGH SENSITIVITY)  ?TROPONIN I (HIGH SENSITIVITY)  ? ? ?EKG ?EKG  Interpretation ? ?Date/Time:  Monday October 09 2021 08:22:44 EDT ?Ventricular Rate:  161 ?PR Interval:    ?QRS Duration: 83 ?QT Interval:  242 ?QTC Calculation: 396 ?R Axis:   6 ?Text Interpretation: Atrial fibrillation with rapid V-rate Low voltage, precordial leads Repolarization abnormality, prob rate related Confirmed by Sherwood Gambler [phone]) on 10/09/2021 8:30:47 AM ? ? EKG Interpretation ? ?Date/Time:  Monday October 09 2021 10:15:04 EDT ?Ventricular Rate:  64 ?PR Interval:  144 ?QRS Duration: 102 ?QT Interval:  392 ?QTC Calculation: 405 ?R Axis:   -7 ?Text Interpretation: Sinus rhythm Low voltage, precordial leads afib no longer present Confirmed by [facility]wood Gambler [phone]) on 10/09/2021 10:56:52 AM ?  ? ?  ?  ?Radiology ?DG Chest Port 1 View ? ?Result Date: 10/09/2021 ?CLINICAL DATA:  Chest pain EXAM: PORTABLE CHEST 1 VIEW COMPARISON:  02/15/2014 FINDINGS: Normal mediastinum and cardiac silhouette. Normal pulmonary vasculature. No evidence of effusion, infiltrate, or pneumothorax. No acute bony abnormality. IMPRESSION: No acute cardiopulmonary process. Electronically Signed   By: Suzy Bouchard M.D.   On: 10/09/2021 08:38   ? ?Procedures ?.Sedation ? ?Date/Time: 10/09/2021 10:19 AM ?Performed by: [place] Gambler, MD ?Authorized by: [place] Gambler, MD  ? ?Consent:  ?  Consent obtained:  Verbal and written ?  Consent given by:  Patient ?  Risks discussed:  Allergic reaction, dysrhythmia, inadequate sedation, nausea, vomiting, respiratory compromise necessitating ventilatory assistance and intubation, prolonged sedation necessitating reversal and prolonged hypoxia resulting in organ damage ?Universal protocol:  ?  Immediately prior to procedure, a time out was called: yes   ?Indications:  ?  Procedure performed:  Cardioversion ?  Procedure necessitating sedation performed by:  Physician performing sedation ?Pre-sedation assessment:  ?  Time since last food or drink:  8+ hours ?  ASA classification: class 2 - patient  with mild systemic disease   ?  Mallampati score:  I - soft palate, uvula, fauces, pillars visible ?  Pre-sedation assessments completed and reviewed: airway patency, cardiovascular function, hydration status, mental status, nausea/vomiting, pain level, respiratory function and temperature   ?Immediate pre-procedure details:  ?  Reviewed: vital signs, relevant labs/tests and NPO status   ?  Verified: bag valve mask available, emergency equipment available, intubation equipment available, IV patency confirmed, oxygen available and suction available   ?Procedure details (see MAR for exact dosages):  ?  Preoxygenation:  Nasal cannula ?  Sedation:  Propofol ?  Intended level of sedation: deep ?  Intra-procedure monitoring:  Blood pressure monitoring, cardiac monitor, continuous pulse oximetry, continuous capnometry, frequent LOC assessments and frequent vital  sign checks ?  Intra-procedure events: none   ?  Total Provider sedation time (minutes):  11 ?Post-procedure details:  ?  Attendance: Constant attendance by certified staff until patient recovered   ?  Recovery: Patient returned to pre-procedure baseline   ?  Post-sedation assessments completed and reviewed: airway patency, cardiovascular function, hydration status, mental status, nausea/vomiting, pain level, respiratory function and temperature   ?  Patient is stable for discharge or admission: yes   ?  Procedure completion:  Tolerated well, no immediate complications ?.Cardioversion ? ?Date/Time: 10/09/2021 10:20 AM ?Performed by: Sherwood Gambler, MD ?Authorized by: Sherwood Gambler, MD  ? ?Consent:  ?  Consent obtained:  Verbal and written ?  Consent given by:  Patient ?  Risks discussed:  Cutaneous burn, induced arrhythmia, pain and death ?Pre-procedure details:  ?  Cardioversion basis:  Emergent ?  Rhythm:  Atrial fibrillation ?  Electrode placement:  Anterior-lateral ?Patient sedated: Yes. Refer to sedation procedure documentation for details of  sedation. ? ?Attempt one:  ?  Cardioversion mode:  Synchronous ?  Waveform:  Biphasic ?  Shock (Joules):  200 ?  Shock outcome:  Conversion to normal sinus rhythm ?Post-procedure details:  ?  Patient status:  Awake ?  Patient tolerance of procedure:

## 2021-10-09 NOTE — Discharge Instructions (Addendum)
If you develop recurrent, continued, or worsening chest pain, shortness of breath, fever, vomiting, abdominal or back pain, or any other new/concerning symptoms then return to the ER for evaluation.  ? ?You are being prescribed Eliquis.  If you develop acute headache, bleeding, or even minor injury then you need to return to the emergency department for evaluation.  You may NOT take Ibuprofen/Advil/Aleve/Motrin/Goody Powders/Naproxen/BC powders/Meloxicam/Diclofenac/Indomethacin and other Nonsteroidal anti-inflammatory medications.  If you have pain you will need to take Tylenol for pain relief. ?

## 2021-10-09 NOTE — TOC Benefit Eligibility Note (Signed)
Patient Advocate Encounter ? ?Insurance verification completed.   ? ?The patient is currently admitted and upon discharge could be taking Eliquis 5 mg. ? ?The current 30 day co-pay is, $414.27 due to a $375.04 deductible remaining.  ? ?The patient is insured through [employer] Medicare Part D  ? ? ? ?Lyndel Safe, CPhT ?Pharmacy Patient Advocate Specialist ?[facility] Patient Advocate Team ?Direct Number: [phone]  Fax: [phone] ? ? ? ? ? ?  ?

## 2021-10-09 NOTE — ED Notes (Signed)
Synchronized Cardioversion was performed. 200 joules, synchronized delivered by Dr. Regenia Skeeter at 1014. Patient converted to NSR  ?

## 2021-10-12 ENCOUNTER — Ambulatory Visit (INDEPENDENT_AMBULATORY_CARE_PROVIDER_SITE_OTHER): Payer: Medicare Other | Admitting: Cardiology

## 2021-10-12 ENCOUNTER — Encounter: Payer: Self-pay | Admitting: Cardiology

## 2021-10-12 VITALS — BP 134/64 | HR 68 | Ht 65.0 in | Wt 212.0 lb

## 2021-10-12 DIAGNOSIS — I4891 Unspecified atrial fibrillation: Secondary | ICD-10-CM

## 2021-10-12 MED ORDER — METOPROLOL TARTRATE 25 MG PO TABS: 37.5000 mg | ORAL_TABLET | Freq: Two times a day (BID) | ORAL | 3 refills | Status: DC

## 2021-10-12 MED ORDER — METOPROLOL SUCCINATE ER 25 MG PO TB24: 37.5000 mg | ORAL_TABLET | Freq: Every day | ORAL | 3 refills | Status: AC

## 2021-10-12 MED ORDER — APIXABAN 5 MG PO TABS: 5.0000 mg | ORAL_TABLET | Freq: Two times a day (BID) | ORAL | 11 refills | Status: DC

## 2021-10-12 MED ORDER — APIXABAN 5 MG PO TABS: 5.0000 mg | ORAL_TABLET | Freq: Two times a day (BID) | ORAL | 3 refills | Status: DC

## 2021-10-12 NOTE — Patient Instructions (Addendum)
Medication Instructions:  ?STOP Aspirin ? ? ?INCREASE Toprol to 37.5 mg daily ? ? ? ? ?Labwork: ?None today ? ?Testing/Procedures: ?Your physician has requested that you have an echocardiogram. Echocardiography is a painless test that uses sound waves to create images of your heart. It provides your doctor with information about the size and shape of your heart and how well your heart?s chambers and valves are working. This procedure takes approximately one hour. There are no restrictions for this procedure. ? ? ?Follow-Up: ?6 months ? ?Any Other Special Instructions Will Be Listed Below (If Applicable). ? ?If you need a refill on your cardiac medications before your next appointment, please call your pharmacy. ? ?

## 2021-10-12 NOTE — Progress Notes (Signed)
? ? ? ?Clinical Summary ?Ms. Mcmurry is a 76 y.o.female seen today as a new patient, over 3 years since seen by our practice ? ?1.Palpitations ?- ER visit 10/09/21 with chest pain and palpitations ?- EKG showed afib with RVR ?- self converted in ER ?- started on eliquis ? ?- she was cardioverted in the ER given clear onset of symptoms just hours prior ?- had been on toprol 74m daily prior to episode ? ? ?2. HTN ?- compliant with meds ? ? ?3.History of chest pain ?- 2017 nuclear stress without ischemia ? ? ?Past Medical History:  ?Diagnosis Date  ? Anxiety   ? Cancer ([place] 05/2017  ? right breast cancer  ? Family history of breast cancer   ? Family history of ovarian cancer   ? Family history of stomach cancer   ? GERD (gastroesophageal reflux disease)   ? High cholesterol   ? History of radiation therapy 07/03/17- 07/31/2017  ? Right Breast/ 40/05 Gy in 15 fractions, Right Breast boost/ 10 Gy in 5 fractions.   ? Hypertension   ? Personal history of radiation therapy 2018/2019  ? ? ? ?Allergies  ?Allergen Reactions  ? Nitrostat [Nitroglycerin] Shortness Of Breath  ? ? ? ?Current Outpatient Medications  ?Medication Sig Dispense Refill  ? amLODipine (NORVASC) 10 MG tablet Take 10 mg by mouth daily.    ? anastrozole (ARIMIDEX) 1 MG tablet Take 1 tablet (1 mg total) by mouth daily. 90 tablet 3  ? apixaban (ELIQUIS) 5 MG TABS tablet Take 1 tablet (5 mg total) by mouth 2 (two) times daily. 60 tablet 0  ? aspirin EC 81 MG tablet Take 81 mg by mouth daily.    ? Cholecalciferol (VITAMIN D) 125 MCG (5000 UT) CAPS Take 5,000 Units by mouth daily.    ? Coenzyme Q10 200 MG capsule Take 200 mg by mouth daily.    ? diclofenac sodium (VOLTAREN) 1 % GEL Apply 2 g topically 4 (four) times daily as needed. (Patient not taking: Reported on 06/07/2021) 100 g 0  ? Krill Oil 500 MG CAPS Take 500 mg by mouth daily.    ? losartan-hydrochlorothiazide (HYZAAR) 100-25 MG per tablet Take 1 tablet by mouth every morning.     ? metoprolol succinate  (TOPROL-XL) 25 MG 24 hr tablet Take 25 mg by mouth every morning.     ? omeprazole (PRILOSEC) 20 MG capsule Take 20 mg by mouth daily.    ? ?No current facility-administered medications for this visit.  ? ? ? ?Past Surgical History:  ?Procedure Laterality Date  ? BREAST BIOPSY Right 05/09/2017  ? x2  ? BREAST LUMPECTOMY Right 05/20/2017  ? CARDIAC CATHETERIZATION    ? 2008- negative findings  ? CATARACT EXTRACTION W/PHACO Right 09/06/2014  ? Procedure: CATARACT EXTRACTION PHACO AND INTRAOCULAR LENS PLACEMENT; CDE:  3.85;  Surgeon: CWilliams Che MD;  Location: [facility];  Service: Ophthalmology;  Laterality: Right;  ? CATARACT EXTRACTION W/PHACO Left 12/13/2014  ? Procedure: CATARACT EXTRACTION PHACO AND INTRAOCULAR LENS PLACEMENT LEFT EYE CDE=10.79;  Surgeon: CWilliams Che MD;  Location: [facility];  Service: Ophthalmology;  Laterality: Left;  ? COLONOSCOPY WITH PROPOFOL N/A 10/28/2020  ? Procedure: COLONOSCOPY WITH PROPOFOL;  Surgeon: CHarvel Quale MD;  [facility]: [facility];  Service: Gastroenterology;  Laterality: N/A;  AM  ? ESOPHAGOGASTRODUODENOSCOPY N/A 05/05/2014  ? Procedure: ESOPHAGOGASTRODUODENOSCOPY (EGD);  Surgeon: NRogene Houston MD;  [facility]: [facility];  Service: Endoscopy;  Laterality: N/A;  255  ?  EUS N/A 06/10/2014  ? Procedure: UPPER ENDOSCOPIC ULTRASOUND (EUS) LINEAR;  Surgeon: Milus Banister, MD;  Location: [facility];  Service: Endoscopy;  Laterality: N/A;  ? POLYPECTOMY  10/28/2020  ? Procedure: POLYPECTOMY;  Surgeon: Montez Morita, Quillian Quince, MD;  Location: [facility];  Service: Gastroenterology;;  ? RADIOACTIVE SEED GUIDED PARTIAL MASTECTOMY WITH AXILLARY SENTINEL LYMPH NODE BIOPSY Right 05/20/2017  ? Procedure: RIGHT BREAST LUMPECTOMY WITH RADIOACTIVE SEED AND SENTINEL LYMPH NODE BIOPSY;  Surgeon: Rolm Bookbinder, MD;  Location: [facility];  Service: General;  Laterality: Right;  ? TUBAL LIGATION    ? ? ? ?Allergies  ?Allergen Reactions  ? Nitrostat  [Nitroglycerin] Shortness Of Breath  ? ? ? ? ?Family History  ?Problem Relation Age of Onset  ? CAD Mother 81  ? Leukemia Father   ? Parkinson's disease Father   ? Breast cancer Daughter 57  ?     BRCA neg  ? Heart Problems Maternal Aunt   ? Cancer Maternal Uncle   ?     NOS  ? Cancer Paternal Uncle   ?     NOS  ? Diabetes Maternal Grandfather   ? Liver cancer Maternal Grandfather   ? Cancer Paternal Grandmother   ?     NOS  ? Brain cancer Maternal Aunt   ? Lung cancer Maternal Aunt   ?     smoker  ? Cancer Maternal Uncle   ?     NOS  ? Stomach cancer Maternal Uncle   ? Lung cancer Maternal Uncle   ?     smoker  ? Breast cancer Cousin   ?     mat first cousin  ? Breast cancer Cousin   ?     mat first cousin dx under 16  ? Breast cancer Cousin   ?     2 additional mat first cousins  ? Ovarian cancer Cousin   ?     mat first cousin  ? Cancer Cousin   ?     several female mat first cousins with cancer NOS  ? Breast cancer Cousin   ?     2 pat first cousins  ? Cancer Cousin   ?     3 pat first cousins with cancer NOS  ? ? ? ?Social History ?Ms. Plata reports that she has never smoked. She has never used smokeless tobacco. ?Ms. Polkowski reports no history of alcohol use. ? ? ?Review of Systems ?CONSTITUTIONAL: No weight loss, fever, chills, weakness or fatigue.  ?HEENT: Eyes: No visual loss, blurred vision, double vision or yellow sclerae.No hearing loss, sneezing, congestion, runny nose or sore throat.  ?SKIN: No rash or itching.  ?CARDIOVASCULAR: per hpi ?RESPIRATORY: No shortness of breath, cough or sputum.  ?GASTROINTESTINAL: No anorexia, nausea, vomiting or diarrhea. No abdominal pain or blood.  ?GENITOURINARY: No burning on urination, no polyuria ?NEUROLOGICAL: No headache, dizziness, syncope, paralysis, ataxia, numbness or tingling in the extremities. No change in bowel or bladder control.  ?MUSCULOSKELETAL: No muscle, back pain, joint pain or stiffness.  ?LYMPHATICS: No enlarged nodes. No history of splenectomy.   ?PSYCHIATRIC: No history of depression or anxiety.  ?ENDOCRINOLOGIC: No reports of sweating, cold or heat intolerance. No polyuria or polydipsia.  ?. ? ? ?Physical Examination ?Today's Vitals  ? 10/12/21 0810  ?BP: 134/64  ?Pulse: 68  ?SpO2: 99%  ?Weight: 212 lb (96.2 kg)  ?Height: 5' 5"  (1.651 m)  ? ?Body mass index is 35.28 kg/m?Marland Kitchen ? ?  Gen: resting comfortably, no acute distress ?HEENT: no scleral icterus, pupils equal round and reactive, no palptable cervical adenopathy,  ?CV: RRR, n om/r/g no jvd ?Resp: Clear to auscultation bilaterally ?GI: abdomen is soft, non-tender, non-distended, normal bowel sounds, no hepatosplenomegaly ?MSK: extremities are warm, no edema.  ?Skin: warm, no rash ?Neuro:  no focal deficits ?Psych: appropriate affect ? ? ?Diagnostic Studies ? ?06/2016 nuclear stress ?There was no ST segment deviation noted during stress. ?The study is normal. No gross ischemic territories nor evidence of scar. ?This is a low risk study. ?Nuclear stress EF: 60%. ? ? ?06/2016 echo ?Study Conclusions  ? ?- Left ventricle: The cavity size was normal. Wall thickness was  ?  normal. Systolic function was normal. The estimated ejection  ?  fraction was in the range of 60% to 65%. Wall motion was normal;  ?  there were no regional wall motion abnormalities. Doppler  ?  parameters are consistent with abnormal left ventricular  ?  relaxation (grade 1 diastolic dysfunction).  ?- Left atrium: The atrium was mildly dilated.  ?- Tricuspid valve: There was mild regurgitation.  ? ? ?Assessment and Plan  ?New onset afib ?-new diagnosis during recent ER visit ?- given symptoms were onset just a few hours prior she was cardioverted in the ER ?- no recurrent symptoms. Had been on toprol 25, will increase to 37.61m daily ?- CHADS2Vasc score is 417(age, HTN, gender), continue eliquis. Can d/c ASA ?- repeat echo in setting of new afib ? ? ?F/u 6 months ? ? ? ? ? ?JArnoldo Lenis M.D. ?

## 2021-10-16 DIAGNOSIS — M549 Dorsalgia, unspecified: Secondary | ICD-10-CM | POA: Diagnosis not present

## 2021-10-16 DIAGNOSIS — I10 Essential (primary) hypertension: Secondary | ICD-10-CM | POA: Diagnosis not present

## 2021-10-16 DIAGNOSIS — Z6834 Body mass index (BMI) 34.0-34.9, adult: Secondary | ICD-10-CM | POA: Diagnosis not present

## 2021-10-16 DIAGNOSIS — E7800 Pure hypercholesterolemia, unspecified: Secondary | ICD-10-CM | POA: Diagnosis not present

## 2021-10-16 DIAGNOSIS — Z299 Encounter for prophylactic measures, unspecified: Secondary | ICD-10-CM | POA: Diagnosis not present

## 2021-10-16 DIAGNOSIS — Z789 Other specified health status: Secondary | ICD-10-CM | POA: Diagnosis not present

## 2021-10-16 DIAGNOSIS — I480 Paroxysmal atrial fibrillation: Secondary | ICD-10-CM | POA: Diagnosis not present

## 2021-10-24 ENCOUNTER — Ambulatory Visit (HOSPITAL_COMMUNITY)
Admission: RE | Admit: 2021-10-24 | Discharge: 2021-10-24 | Disposition: A | Discharge status: Home / Self Care | Payer: Medicare Other | Source: Ambulatory Visit | Attending: Cardiology | Admitting: Cardiology

## 2021-10-24 DIAGNOSIS — I4891 Unspecified atrial fibrillation: Secondary | ICD-10-CM

## 2021-10-24 LAB — ECHOCARDIOGRAM COMPLETE
AR max vel: 1.65 cm2
AV Area VTI: 2.05 cm2
AV Area mean vel: 1.73 cm2
AV Mean grad: 5.6 mmHg
AV Peak grad: 11.8 mmHg
Ao pk vel: 1.72 m/s
Area-P 1/2: 3.53 cm2
S' Lateral: 3 cm

## 2021-10-24 NOTE — Progress Notes (Signed)
*  PRELIMINARY RESULTS* ?Echocardiogram ?2D Echocardiogram has been performed. ? ?Marissa [name] ?10/24/2021, 8:55 AM ?

## 2021-10-26 ENCOUNTER — Telehealth: Payer: Self-pay | Admitting: Cardiology

## 2021-10-26 NOTE — Telephone Encounter (Signed)
Patient is calling for her Echo results.  

## 2021-10-26 NOTE — Telephone Encounter (Signed)
Will route to provider for results.  

## 2021-10-30 NOTE — Telephone Encounter (Signed)
Echo overall looks good, heart pumping function is normal. Very mild age related stiffness of the heart muscle which is common and not of concern  ? ?Zandra Abts MD ?

## 2021-10-30 NOTE — Telephone Encounter (Signed)
Just resulted   J Quita Mcgrory MD 

## 2021-10-30 NOTE — Telephone Encounter (Signed)
Patient notified and verbalized understanding. Pt had no questions or concerns at this time 

## 2021-11-15 DIAGNOSIS — Z713 Dietary counseling and surveillance: Secondary | ICD-10-CM | POA: Diagnosis not present

## 2021-11-15 DIAGNOSIS — I10 Essential (primary) hypertension: Secondary | ICD-10-CM | POA: Diagnosis not present

## 2021-11-15 DIAGNOSIS — Z6834 Body mass index (BMI) 34.0-34.9, adult: Secondary | ICD-10-CM | POA: Diagnosis not present

## 2021-11-15 DIAGNOSIS — I480 Paroxysmal atrial fibrillation: Secondary | ICD-10-CM | POA: Diagnosis not present

## 2021-11-15 DIAGNOSIS — Z299 Encounter for prophylactic measures, unspecified: Secondary | ICD-10-CM | POA: Diagnosis not present

## 2021-11-20 ENCOUNTER — Other Ambulatory Visit (HOSPITAL_COMMUNITY): Payer: Self-pay

## 2021-11-20 DIAGNOSIS — Z170 Estrogen receptor positive status [ER+]: Secondary | ICD-10-CM

## 2021-11-20 DIAGNOSIS — E559 Vitamin D deficiency, unspecified: Secondary | ICD-10-CM

## 2021-11-28 ENCOUNTER — Encounter: Payer: Self-pay | Admitting: Hematology

## 2021-11-28 DIAGNOSIS — C50411 Malignant neoplasm of upper-outer quadrant of right female breast: Secondary | ICD-10-CM | POA: Diagnosis not present

## 2021-11-28 DIAGNOSIS — E559 Vitamin D deficiency, unspecified: Secondary | ICD-10-CM | POA: Diagnosis not present

## 2021-11-28 DIAGNOSIS — Z170 Estrogen receptor positive status [ER+]: Secondary | ICD-10-CM | POA: Diagnosis not present

## 2021-12-12 ENCOUNTER — Inpatient Hospital Stay (HOSPITAL_COMMUNITY): Payer: Medicare Other | Attending: Hematology | Admitting: Hematology

## 2021-12-12 VITALS — BP 122/56 | HR 62 | Temp 97.9°F | Resp 16 | Wt 217.2 lb

## 2021-12-12 DIAGNOSIS — C50411 Malignant neoplasm of upper-outer quadrant of right female breast: Secondary | ICD-10-CM | POA: Insufficient documentation

## 2021-12-12 DIAGNOSIS — Z170 Estrogen receptor positive status [ER+]: Secondary | ICD-10-CM | POA: Diagnosis not present

## 2021-12-12 DIAGNOSIS — M8580 Other specified disorders of bone density and structure, unspecified site: Secondary | ICD-10-CM | POA: Insufficient documentation

## 2021-12-12 DIAGNOSIS — E559 Vitamin D deficiency, unspecified: Secondary | ICD-10-CM

## 2021-12-12 DIAGNOSIS — M818 Other osteoporosis without current pathological fracture: Secondary | ICD-10-CM

## 2021-12-12 DIAGNOSIS — Z79811 Long term (current) use of aromatase inhibitors: Secondary | ICD-10-CM | POA: Insufficient documentation

## 2021-12-12 NOTE — Progress Notes (Signed)
Patient is taking Anastrozole as prescribed.  She has not missed any doses and reports no side effects at this time.   

## 2021-12-12 NOTE — Patient Instructions (Addendum)
[place] at [facility] Discharge Instructions   You were seen and examined today by Dr. Delton Coombes.  He reviewed the results of your lab work which is mostly normal. Your calcium is high. Cut back vitamin D to 2000 units daily.   We will schedule you for a bone density test prior to your next visit.   We will arrange for your mammogram after 05/19/2022.   Return as scheduled.    Thank you for choosing [place] at [facility] to provide your oncology and hematology care.  To afford each patient quality time with our provider, please arrive at least 15 minutes before your scheduled appointment time.   If you have a lab appointment with the [facility] please come in thru the Main Entrance and check in at the main information desk.  You need to re-schedule your appointment should you arrive 10 or more minutes late.  We strive to give you quality time with our providers, and arriving late affects you and other patients whose appointments are after yours.  Also, if you no show three or more times for appointments you may be dismissed from the clinic at the providers discretion.     Again, thank you for choosing [facility].  Our hope is that these requests will decrease the amount of time that you wait before being seen by our physicians.       _____________________________________________________________  Should you have questions after your visit to [facility], please contact our office at [phone] and follow the prompts.  Our office hours are 8:00 a.m. and 4:30 p.m. Monday - Friday.  Please note that voicemails left after 4:00 p.m. may not be returned until the following business day.  We are closed weekends and major holidays.  You do have access to a nurse 24-7, just call the main number to the clinic [phone] and do not press any options, hold on the line and a nurse will answer the phone.    For  prescription refill requests, have your pharmacy contact our office and allow 72 hours.    Due to Covid, you will need to wear a mask upon entering the hospital. If you do not have a mask, a mask will be given to you at the Main Entrance upon arrival. For doctor visits, patients may have 1 support person age 13 or older with them. For treatment visits, patients can not have anyone with them due to social distancing guidelines and our immunocompromised population.

## 2021-12-12 NOTE — Progress Notes (Signed)
[address], [place] [address]   Patient Care Team: Marissa Blitz, MD as PCP - General (Internal Medicine)  SUMMARY OF ONCOLOGIC HISTORY: Oncology History  Carcinoma of upper-outer quadrant of right breast in female, estrogen receptor positive ([place])  06/11/2017 Initial Diagnosis   Carcinoma of upper-outer quadrant of right breast in female, estrogen receptor positive ([place])    07/08/2017 Genetic Testing   Negative genetic testing on the Multi-cancer panel.  The Multi-Gene Panel offered by [facility] includes sequencing and/or deletion duplication testing of the following 83 genes: ALK, APC, ATM, AXIN2,BAP1,  BARD1, BLM, BMPR1A, BRCA1, BRCA2, BRIP1, CASR, CDC73, CDH1, CDK4, CDKN1B, CDKN1C, CDKN2A (p14ARF), CDKN2A (p16INK4a), CEBPA, CHEK2, CTNNA1, DICER1, DIS3L2, EGFR (c.2369C>T, p.Thr790Met variant only), EPCAM (Deletion/duplication testing only), FH, FLCN, GATA2, GPC3, GREM1 (Promoter region deletion/duplication testing only), HOXB13 (c.251G>A, p.Gly84Glu), HRAS, KIT, MAX, MEN1, MET, MITF (c.952G>A, p.Glu318Lys variant only), MLH1, MSH2, MSH3, MSH6, MUTYH, NBN, NF1, NF2, NTHL1, PALB2, PDGFRA, PHOX2B, PMS2, POLD1, POLE, POT1, PRKAR1A, PTCH1, PTEN, RAD50, RAD51C, RAD51D, RB1, RECQL4, RET, RUNX1, SDHAF2, SDHA (sequence changes only), SDHB, SDHC, SDHD, SMAD4, SMARCA4, SMARCB1, SMARCE1, STK11, SUFU, TERT, TERT, TMEM127, TP53, TSC1, TSC2, VHL, WRN and WT1.  The report date is July 08, 2017.       CHIEF COMPLIANT: Follow up for right breast cancer   INTERVAL HISTORY: [name] is a 76 y.o. female here today for follow up of her right breast cancer. Her last visit was on 06/07/2021.   Today she reports feeling good. She reports back pain since January. Her shoulder pain has resolved. She has stopped taking calcium, and she continues to take vitamin D.   REVIEW OF SYSTEMS:   Review of Systems  Constitutional:  Negative for appetite change and fatigue.   Respiratory:  Positive for cough.   Musculoskeletal:  Positive for back pain (4/10).  All other systems reviewed and are negative.  I have reviewed the past medical history, past surgical history, social history and family history with the patient and they are unchanged from previous note.   ALLERGIES:   is allergic to nitrostat [nitroglycerin].   MEDICATIONS:  Current Outpatient Medications  Medication Sig Dispense Refill   amLODipine (NORVASC) 10 MG tablet Take 10 mg by mouth daily.     anastrozole (ARIMIDEX) 1 MG tablet Take 1 tablet (1 mg total) by mouth daily. 90 tablet 3   Cholecalciferol (VITAMIN D) 125 MCG (5000 UT) CAPS Take 5,000 Units by mouth daily.     Coenzyme Q10 200 MG capsule Take 200 mg by mouth daily.     diclofenac sodium (VOLTAREN) 1 % GEL Apply 2 g topically 4 (four) times daily as needed. 100 g 0   ezetimibe (ZETIA) 10 MG tablet Take 10 mg by mouth daily.     Krill Oil 500 MG CAPS Take 500 mg by mouth daily.     losartan-hydrochlorothiazide (HYZAAR) 100-25 MG per tablet Take 1 tablet by mouth every morning.      metoprolol succinate (TOPROL XL) 25 MG 24 hr tablet Take 1.5 tablets (37.5 mg total) by mouth daily. 135 tablet 3   omeprazole (PRILOSEC) 20 MG capsule Take 20 mg by mouth daily.     apixaban (ELIQUIS) 5 MG TABS tablet Take 1 tablet (5 mg total) by mouth 2 (two) times daily. 180 tablet 3   No current facility-administered medications for this visit.     PHYSICAL EXAMINATION: Performance status (ECOG): 1 - Symptomatic but completely ambulatory  Vitals:   12/12/21 0808  BP: (!) 122/56  Pulse: 62  Resp: 16  Temp: 97.9 F (36.6 C)  SpO2: 99%   Wt Readings from Last 3 Encounters:  12/12/21 217 lb 3.2 oz (98.5 kg)  10/12/21 212 lb (96.2 kg)  10/09/21 216 lb 7.9 oz (98.2 kg)   Physical Exam Vitals reviewed.  Constitutional:      Appearance: Normal appearance. She is obese.  Cardiovascular:     Rate and Rhythm: Normal rate and regular  rhythm.     Pulses: Normal pulses.     Heart sounds: Normal heart sounds.  Pulmonary:     Effort: Pulmonary effort is normal.     Breath sounds: Normal breath sounds.  Chest:  Breasts:    Right: No swelling, bleeding, inverted nipple, mass, nipple discharge, skin change (UOQ lumpectomy scar) or tenderness.     Left: No swelling, bleeding, inverted nipple, mass, nipple discharge, skin change or tenderness.  Musculoskeletal:     Right lower leg: No edema.     Left lower leg: No edema.  Neurological:     General: No focal deficit present.     Mental Status: She is alert and oriented to person, place, and time.  Psychiatric:        Mood and Affect: Mood normal.        Behavior: Behavior normal.    Breast Exam Chaperone: Marissa [name]     LABORATORY DATA:  I have reviewed the data as listed    Latest Ref Rng & Units 10/09/2021    8:21 AM 10/26/2020   11:37 AM 05/12/2020    8:39 AM  CMP  Glucose 70 - 99 mg/dL 109   83   88    BUN 8 - 23 mg/dL _0 Creatinine 0.44 - 1.00 mg/dL 1.05   0.96   0.96    Sodium 135 - 145 mmol/L 138   139   135    Potassium 3.5 - 5.1 mmol/L 4.4   3.9   3.8    Chloride 98 - 111 mmol/L 104   105   101    CO2 22 - 32 mmol/L _1 Calcium 8.9 - 10.3 mg/dL 10.3   9.8   10.2    Total Protein 6.5 - 8.1 g/dL   7.4    Total Bilirubin 0.3 - 1.2 mg/dL   0.9    Alkaline Phos 38 - 126 U/L   91    AST 15 - 41 U/L   22    ALT 0 - 44 U/L   20     Lab Results  Component Value Date   CAN153 7.4 05/12/2020   CAN153 7.1 11/10/2019   CAN153 6.6 05/13/2019   Lab Results  Component Value Date   WBC 9.0 10/09/2021   HGB 16.0 (H) 10/09/2021   HCT 49.1 (H) 10/09/2021   MCV 94.2 10/09/2021   PLT 294 10/09/2021   NEUTROABS 4.4 05/12/2020    ASSESSMENT:  1.  Right breast infiltrating ductal carcinoma, ER/PR positive HER-2 negative: -Lumpectomy on 06/11/2017, 1.4 cm, 0/2 sentinel lymph nodes positive, margins negative, ER/PR positive, HER-2  negative, Ki-67 5%. -Oncotype DX recurrence score of 10.  Negative genetic testing for hereditary breast cancer. -Radiation therapy finished in January 2019.  Anastrozole started on 09/06/2017. -Mammogram from 05/16/2020 was BI-RADS Category 2.   2.  Osteopenia: -DEXA scan at  Dr. Trena Platt office on 08/07/2018 shows T score -1.4.   3.  Right ovarian cyst and left renal lesion: -Recent pelvic ultrasound on 04/17/2019 shows right ovary entirely occupied by a simple cyst measuring 3.3 x 2.4 x 1.7 cm.  This has benign characteristics.  CA-125 done by Dr. Manuella Ghazi on 05/13/2019 was 6.1. -Ultrasound of the abdomen on 04/17/2019 shows multiple parapelvic renal cysts bilaterally with cortical thinning and increased echogenicity.  Upper pole left renal cyst similar to that visualized on prior CT scan.  Fatty infiltration of the liver present.   PLAN:  1.  Right breast infiltrating ductal carcinoma, ER/PR positive HER-2 negative: - Lumpectomy scar in the upper outer quadrant of the right breast is stable.  No palpable masses or adenopathy in bilateral breast. - Last mammogram on 05/19/2021 was BI-RADS Category 2. - She is tolerating anastrozole very well. - She reports back pain for few months.  She had x-ray of her lumbar spine in 2021 and was told due to degenerative changes. - Reviewed labs from [place].  CBC was normal.  LFTs were completely normal.  CA 15-3 was 7.1. - Continue anastrozole beyond 5 years based on AERAS results. - RTC 6 months for follow-up with repeat labs.  We will also schedule her for mammogram in November.   2.  Osteopenia: - Vitamin D level is 53.  She is taking vitamin D 5000 units daily. - Calcium level is 10.8 with albumin 4.6. - Recommend cutting back on vitamin D to 2000 units daily. - Plan to repeat bone density test prior to next visit.  Breast Cancer therapy associated bone loss: I have recommended calcium, Vitamin D and weight bearing exercises.  Orders placed this  encounter:  Orders Placed This Encounter  Procedures   DG Bone Density   MM DIAG BREAST TOMO BILATERAL    The patient has a good understanding of the overall plan. She agrees with it. She will call with any problems that may develop before the next visit here.  Derek Jack, MD [place] [phone]   I, Marissa [name], am acting as a scribe for Dr. Derek Jack.  I, Derek Jack MD, have reviewed the above documentation for accuracy and completeness, and I agree with the above.

## 2021-12-13 NOTE — Addendum Note (Signed)
Addended by: Brien Mates on: 12/13/2021 12:06 PM   Modules accepted: Orders

## 2021-12-15 ENCOUNTER — Ambulatory Visit (HOSPITAL_COMMUNITY)
Admission: RE | Admit: 2021-12-15 | Discharge: 2021-12-15 | Disposition: A | Discharge status: Home / Self Care | Payer: Medicare Other | Source: Ambulatory Visit | Attending: Hematology | Admitting: Hematology

## 2021-12-15 DIAGNOSIS — M85852 Other specified disorders of bone density and structure, left thigh: Secondary | ICD-10-CM | POA: Diagnosis not present

## 2021-12-15 DIAGNOSIS — C50411 Malignant neoplasm of upper-outer quadrant of right female breast: Secondary | ICD-10-CM | POA: Insufficient documentation

## 2021-12-15 DIAGNOSIS — Z170 Estrogen receptor positive status [ER+]: Secondary | ICD-10-CM | POA: Insufficient documentation

## 2021-12-15 DIAGNOSIS — M818 Other osteoporosis without current pathological fracture: Secondary | ICD-10-CM | POA: Diagnosis not present

## 2021-12-15 DIAGNOSIS — Z780 Asymptomatic menopausal state: Secondary | ICD-10-CM | POA: Diagnosis not present

## 2021-12-31 ENCOUNTER — Emergency Department (HOSPITAL_COMMUNITY)
Admission: EM | Admit: 2021-12-31 | Discharge: 2021-12-31 | Disposition: A | Discharge status: Home / Self Care | Payer: Medicare Other | Attending: Emergency Medicine | Admitting: Emergency Medicine

## 2021-12-31 ENCOUNTER — Encounter (HOSPITAL_COMMUNITY): Payer: Self-pay | Admitting: Emergency Medicine

## 2021-12-31 ENCOUNTER — Emergency Department (HOSPITAL_COMMUNITY): Payer: Medicare Other

## 2021-12-31 DIAGNOSIS — R072 Precordial pain: Secondary | ICD-10-CM | POA: Diagnosis not present

## 2021-12-31 DIAGNOSIS — M542 Cervicalgia: Secondary | ICD-10-CM | POA: Diagnosis not present

## 2021-12-31 DIAGNOSIS — Z853 Personal history of malignant neoplasm of breast: Secondary | ICD-10-CM | POA: Insufficient documentation

## 2021-12-31 DIAGNOSIS — G459 Transient cerebral ischemic attack, unspecified: Secondary | ICD-10-CM | POA: Diagnosis not present

## 2021-12-31 DIAGNOSIS — G43809 Other migraine, not intractable, without status migrainosus: Secondary | ICD-10-CM | POA: Diagnosis not present

## 2021-12-31 DIAGNOSIS — I10 Essential (primary) hypertension: Secondary | ICD-10-CM | POA: Diagnosis not present

## 2021-12-31 DIAGNOSIS — I200 Unstable angina: Secondary | ICD-10-CM | POA: Diagnosis not present

## 2021-12-31 DIAGNOSIS — Z7901 Long term (current) use of anticoagulants: Secondary | ICD-10-CM | POA: Diagnosis not present

## 2021-12-31 DIAGNOSIS — Z79899 Other long term (current) drug therapy: Secondary | ICD-10-CM | POA: Diagnosis not present

## 2021-12-31 DIAGNOSIS — G4489 Other headache syndrome: Secondary | ICD-10-CM | POA: Diagnosis not present

## 2021-12-31 DIAGNOSIS — I672 Cerebral atherosclerosis: Secondary | ICD-10-CM | POA: Diagnosis not present

## 2021-12-31 DIAGNOSIS — R079 Chest pain, unspecified: Secondary | ICD-10-CM | POA: Diagnosis not present

## 2021-12-31 LAB — COMPREHENSIVE METABOLIC PANEL
ALT: 19 U/L (ref 0–44)
AST: 18 U/L (ref 15–41)
Albumin: 4 g/dL (ref 3.5–5.0)
Alkaline Phosphatase: 99 U/L (ref 38–126)
Anion gap: 11 (ref 5–15)
BUN: 22 mg/dL (ref 8–23)
CO2: 23 mmol/L (ref 22–32)
Calcium: 10.4 mg/dL — ABNORMAL HIGH (ref 8.9–10.3)
Chloride: 104 mmol/L (ref 98–111)
Creatinine, Ser: 0.89 mg/dL (ref 0.44–1.00)
GFR, Estimated: >60 mL/min (ref >60)
Glucose, Bld: 117 mg/dL — ABNORMAL HIGH (ref 70–99)
Potassium: 3.4 mmol/L — ABNORMAL LOW (ref 3.5–5.1)
Sodium: 138 mmol/L (ref 135–145)
Total Bilirubin: 0.9 mg/dL (ref 0.3–1.2)
Total Protein: 7.2 g/dL (ref 6.5–8.1)

## 2021-12-31 LAB — CBC
HCT: 42.9 % (ref 36.0–46.0)
Hemoglobin: 14.2 g/dL (ref 12.0–15.0)
MCH: 30.5 pg (ref 26.0–34.0)
MCHC: 33.1 g/dL (ref 30.0–36.0)
MCV: 92.1 fL (ref 80.0–100.0)
Platelets: 247 10*3/uL (ref 150–400)
RBC: 4.66 MIL/uL (ref 3.87–5.11)
RDW: 11.8 % (ref 11.5–15.5)
WBC: 6.3 10*3/uL (ref 4.0–10.5)
nRBC: 0 % (ref 0.0–0.2)

## 2021-12-31 LAB — LIPASE, BLOOD: Lipase: 23 U/L (ref 11–51)

## 2021-12-31 LAB — PROTIME-INR
INR: 1 (ref 0.8–1.2)
Prothrombin Time: 13.3 seconds (ref 11.4–15.2)

## 2021-12-31 LAB — TROPONIN I (HIGH SENSITIVITY)
Troponin I (High Sensitivity): 5 ng/L (ref <18)
Troponin I (High Sensitivity): 5 ng/L (ref <18)
Troponin I (High Sensitivity): 6 ng/L (ref <18)

## 2021-12-31 LAB — TYPE AND SCREEN
ABO/RH(D): A POS
Antibody Screen: NEGATIVE

## 2021-12-31 MED ORDER — IOHEXOL 350 MG/ML SOLN
100.0000 mL | Freq: Once | INTRAVENOUS | Status: AC | PRN
  Administered 2021-12-31: 75 mL via INTRAVENOUS

## 2021-12-31 MED ORDER — FENTANYL CITRATE PF 50 MCG/ML IJ SOSY
50.0000 ug | PREFILLED_SYRINGE | Freq: Once | INTRAMUSCULAR | Status: AC
  Administered 2021-12-31: 50 ug via INTRAVENOUS
  Filled 2021-12-31: qty 1

## 2021-12-31 MED ORDER — ONDANSETRON HCL 4 MG/2ML IJ SOLN
4.0000 mg | Freq: Once | INTRAMUSCULAR | Status: AC
  Administered 2021-12-31: 4 mg via INTRAVENOUS
  Filled 2021-12-31: qty 2

## 2021-12-31 NOTE — ED Provider Notes (Addendum)
Patient has been completely pain-free of any chest discomfort or chest pain since 4 in the morning.  The initial 2 troponins were 5 and 5.  But they were done close to the time that she had arrived.  Since patient has remained pain-free.  Rest of work-up has been negative we will do a third troponin.  If there is any bump of greater than 10 in the third troponin she will need admission for an unstable angina concern.  If not she can go home and follow-up with cardiology.  She has had pain like this in the past but never lasted this long.  Had an echocardiogram done not too long ago for her atrial fibrillation and that had no significant findings.  But has not had a cardiac stress test for quite some time.  Patient is followed by Dr. Wyline Mood.   Vanetta Mulders, MD 12/31/21 1057  Third troponin is 6.  No significant change.  Patient's had no recurrent of chest pain will discharge home with close follow-up with Dr. Wyline Mood from cardiology.    Vanetta Mulders, MD 12/31/21 1215

## 2021-12-31 NOTE — ED Triage Notes (Signed)
Pt c/o mid chest pain that started about 10pm last night eased up at about midnight but came back strong at about 0230 this morning. Pt states pain radiates up to the right side of her neck.   Pt states recently dx with Afib back in May of this year.

## 2022-01-04 DIAGNOSIS — I480 Paroxysmal atrial fibrillation: Secondary | ICD-10-CM | POA: Diagnosis not present

## 2022-01-04 DIAGNOSIS — I10 Essential (primary) hypertension: Secondary | ICD-10-CM | POA: Diagnosis not present

## 2022-01-04 DIAGNOSIS — Z299 Encounter for prophylactic measures, unspecified: Secondary | ICD-10-CM | POA: Diagnosis not present

## 2022-01-04 DIAGNOSIS — Z6835 Body mass index (BMI) 35.0-35.9, adult: Secondary | ICD-10-CM | POA: Diagnosis not present

## 2022-01-04 DIAGNOSIS — K5900 Constipation, unspecified: Secondary | ICD-10-CM | POA: Diagnosis not present

## 2022-01-05 ENCOUNTER — Ambulatory Visit (INDEPENDENT_AMBULATORY_CARE_PROVIDER_SITE_OTHER): Payer: Medicare Other | Admitting: Family

## 2022-01-05 ENCOUNTER — Encounter (HOSPITAL_BASED_OUTPATIENT_CLINIC_OR_DEPARTMENT_OTHER): Payer: Self-pay | Admitting: Family

## 2022-01-05 VITALS — BP 116/76 | HR 67 | Ht 65.0 in | Wt 213.0 lb

## 2022-01-05 DIAGNOSIS — E876 Hypokalemia: Secondary | ICD-10-CM | POA: Diagnosis not present

## 2022-01-05 DIAGNOSIS — D6859 Other primary thrombophilia: Secondary | ICD-10-CM

## 2022-01-05 DIAGNOSIS — R079 Chest pain, unspecified: Secondary | ICD-10-CM | POA: Diagnosis not present

## 2022-01-05 DIAGNOSIS — I10 Essential (primary) hypertension: Secondary | ICD-10-CM | POA: Diagnosis not present

## 2022-01-05 DIAGNOSIS — R072 Precordial pain: Secondary | ICD-10-CM | POA: Diagnosis not present

## 2022-01-05 DIAGNOSIS — I480 Paroxysmal atrial fibrillation: Secondary | ICD-10-CM

## 2022-01-05 MED ORDER — METOPROLOL TARTRATE 50 MG PO TABS: 50.0000 mg | ORAL_TABLET | Freq: Once | ORAL | 0 refills | Status: DC

## 2022-01-05 MED ORDER — POTASSIUM CHLORIDE CRYS ER 20 MEQ PO TBCR: EXTENDED_RELEASE_TABLET | ORAL | 0 refills | Status: DC

## 2022-01-05 NOTE — Progress Notes (Signed)
Office Visit    Patient Name: [name] Date of Encounter: 01/05/2022  PCP:  Monico Blitz, [place]  Cardiologist:  Carlyle Dolly, MD  Advanced Practice Provider:  No care team member to display Electrophysiologist:  None      Chief Complaint    Marissa [name] is a 76 y.o. female  presents today for follow-up after ED visit  Past Medical History    Past Medical History:  Diagnosis Date   Anxiety    Cancer ([place]) 05/2017   right breast cancer   Family history of breast cancer    Family history of ovarian cancer    Family history of stomach cancer    GERD (gastroesophageal reflux disease)    High cholesterol    History of radiation therapy 07/03/17- 07/31/2017   Right Breast/ 40/05 Gy in 15 fractions, Right Breast boost/ 10 Gy in 5 fractions.    Hypertension    Personal history of radiation therapy 2018/2019   Past Surgical History:  Procedure Laterality Date   BREAST BIOPSY Right 05/09/2017   x2   BREAST LUMPECTOMY Right 05/20/2017   CARDIAC CATHETERIZATION     2008- negative findings   CATARACT EXTRACTION W/PHACO Right 09/06/2014   Procedure: CATARACT EXTRACTION PHACO AND INTRAOCULAR LENS PLACEMENT; CDE:  3.85;  Surgeon: Williams Che, MD;  [facility];  Service: Ophthalmology;  Laterality: Right;   CATARACT EXTRACTION W/PHACO Left 12/13/2014   Procedure: CATARACT EXTRACTION PHACO AND INTRAOCULAR LENS PLACEMENT LEFT EYE CDE=10.79;  Surgeon: Williams Che, MD;  Location: [facility];  Service: Ophthalmology;  Laterality: Left;   COLONOSCOPY WITH PROPOFOL N/A 10/28/2020   Procedure: COLONOSCOPY WITH PROPOFOL;  Surgeon: Harvel Quale, MD;  [facility];  Service: Gastroenterology;  Laterality: N/A;  AM   ESOPHAGOGASTRODUODENOSCOPY N/A 05/05/2014   Procedure: ESOPHAGOGASTRODUODENOSCOPY (EGD);  Surgeon: Rogene Houston, MD;  [facility];  Service: Endoscopy;  Laterality: N/A;  255   EUS N/A  06/10/2014   Procedure: UPPER ENDOSCOPIC ULTRASOUND (EUS) LINEAR;  Surgeon: Milus Banister, MD;  [facility];  Service: Endoscopy;  Laterality: N/A;   POLYPECTOMY  10/28/2020   Procedure: POLYPECTOMY;  Surgeon: Harvel Quale, MD;  [facility];  Service: Gastroenterology;;   RADIOACTIVE SEED GUIDED PARTIAL MASTECTOMY WITH AXILLARY SENTINEL LYMPH NODE BIOPSY Right 05/20/2017   Procedure: RIGHT BREAST LUMPECTOMY WITH RADIOACTIVE SEED AND SENTINEL LYMPH NODE BIOPSY;  Surgeon: Rolm Bookbinder, MD;  Location: [facility];  Service: General;  Laterality: Right;   TUBAL LIGATION      Allergies  Allergies  Allergen Reactions   Nitrostat [Nitroglycerin] Shortness Of Breath    History of Present Illness    Marissa [name] is a 76 y.o. female with a hx of atrial fibrillation, hypertension, breast cancer last seen 10/12/2021.  Prior [employer] 2017 without ischemia.  ED visit 10/09/2021 with A-fib with RVR as symptoms were onset just a few hours prior she was cardioverted in the ER.  She was started on Eliquis..  Last seen 11/01/2021 by Dr. Harl Bowie and Toprol increased to 37.5 mg daily.  Echocardiogram ordered and performed 10/24/2021 normal LVEF 60 to 16%, grade 1 diastolic dysfunction, normal PASP, no significant valvular abnormalities.  ED visit 12/31/2021 with substernal chest pain described as sharp.HS troponin 5 ? 5 ? 6. EKG NSR with PVC. CT head neck unremarkable. K 3.4.   Presents today for follow up independently.  Since ED discharge and fentanyl injection she has had constipation. This has resolved since some adjustments by [place] and her PCP office. Walks in the water 3 times per week 35-40 minutes at the [facility]. Reports no shortness of breath nor dyspnea on exertion. Reports rare midsternal chest discomfort that feels sharp at rest.. No edema, orthopnea, PND. Reports no palpitations.     EKGs/Labs/Other Studies Reviewed:   The following  studies were reviewed today:   EKG:  EKG is ordered today.  The ekg ordered today demonstrates NSR 67 bpm with TWI to lead III, overall flat T waves in lateral leads. No acute ST/T wave changes.   Recent Labs: 10/09/2021: Magnesium 2.4 12/31/2021: ALT 19; BUN 22; Creatinine, Ser 0.89; Hemoglobin 14.2; Platelets 247; Potassium 3.4; Sodium 138  Recent Lipid Panel    Component Value Date/Time   CHOL 253 (H) 06/25/2016 1951   TRIG 143 06/25/2016 1951   HDL 51 06/25/2016 1951   CHOLHDL 5.0 06/25/2016 1951   VLDL 29 06/25/2016 1951   LDLCALC 173 (H) 06/25/2016 1951    Risk Assessment/Calculations:   CHA2DS2-VASc Score = 4   This indicates a 4.8% annual risk of stroke. The patient's score is based upon: CHF History: 0 HTN History: 1 Diabetes History: 0 Stroke History: 0 Vascular Disease History: 0 Age Score: 2 Gender Score: 1    Home Medications   Current Meds  Medication Sig   amLODipine (NORVASC) 10 MG tablet Take 10 mg by mouth daily.   anastrozole (ARIMIDEX) 1 MG tablet Take 1 tablet (1 mg total) by mouth daily.   Cholecalciferol (VITAMIN D) 125 MCG (5000 UT) CAPS Take 5,000 Units by mouth daily.   Coenzyme Q10 200 MG capsule Take 200 mg by mouth daily.   diclofenac sodium (VOLTAREN) 1 % GEL Apply 2 g topically 4 (four) times daily as needed.   ezetimibe (ZETIA) 10 MG tablet Take 10 mg by mouth daily.   Krill Oil 500 MG CAPS Take 500 mg by mouth daily.   losartan-hydrochlorothiazide (HYZAAR) 100-25 MG per tablet Take 1 tablet by mouth every morning.    metoprolol succinate (TOPROL XL) 25 MG 24 hr tablet Take 1.5 tablets (37.5 mg total) by mouth daily.   omeprazole (PRILOSEC) 20 MG capsule Take 20 mg by mouth daily.     Review of Systems      All other systems reviewed and are otherwise negative except as noted above.  Physical Exam    VS:  BP 116/76   Pulse 67   Ht '5\' 5"'$  (1.651 m)   Wt 213 lb (96.6 kg)   BMI 35.45 kg/m  , BMI Body mass index is 35.45 kg/m.  Wt  Readings from Last 3 Encounters:  01/05/22 213 lb (96.6 kg)  12/31/21 217 lb 3.2 oz (98.5 kg)  12/12/21 217 lb 3.2 oz (98.5 kg)    GEN: Well nourished, overweight,  well developed, in no acute distress. HEENT: normal. Neck: Supple, no JVD, carotid bruits, or masses. Cardiac: RRR, no murmurs, rubs, or gallops. No clubbing, cyanosis, edema.  Radials/PT 2+ and equal bilaterally.  Respiratory:  Respirations regular and unlabored, clear to auscultation bilaterally. GI: Soft, nontender, nondistended. MS: No deformity or atrophy. Skin: Warm and dry, no rash. Neuro:  Strength and sensation are intact. Psych: Normal affect.  Assessment & Plan    Chest pain -recent ED visit with normal troponin.  EKG today no acute ST/T wave changes.  Described as a sharp pain in her right  chest at rest.  Consider etiology muscle spasm due to hypokalemia which we will replete.  However given risk factors including chest pain, age, high blood pressure we will plan for ischemic evaluation with cardiac CTA.  Hypokalemia - 12/31/21 K 3.4. Rx Potassium 36mq QD x 3 days. BMP, mag next week to reassess.   PAF / Hypercoagulable state -maintaining sinus rhythm.  Continue Toprol 37.5 mg daily.  Continue Eliquis 5 mg twice daily.  Does not meet dose reduction criteria.  Denies bleeding complications. CHA2DS2-VASc Score = 4 [CHF History: 0, HTN History: 1, Diabetes History: 0, Stroke History: 0, Vascular Disease History: 0, Age Score: 2, Gender Score: 1].  Therefore, the patient's annual risk of stroke is 4.8 %. TSH with upcoming labs.  HTN - BP well controlled. Continue current antihypertensive regimen Hyzaar 10-25 mg QD, Amlodipine '10mg'$  QD, Toprol 37.'5mg'$  QD.  GERD - continue Omeprazole. Continue to follow with PCP.   Disposition: Follow up  in 2- 3 months   with BCarlyle Dolly MD or APP.  Signed, CLoel Dubonnet NP 01/05/2022, 10:03 AM [place]

## 2022-01-05 NOTE — Patient Instructions (Addendum)
Medication Instructions:  Your physician has recommended you make the following change in your medication:   START Potassium 20 mEq daily for 3 days   *this will help to replete your potassium which was a little low in the emergency room.   *If you need a refill on your cardiac medications before your next appointment, please call your pharmacy*   Lab Work: Your physician recommends that you return for lab work in 5-7 days for BMP, magnesium, TSH  If you have labs (blood work) drawn today and your tests are completely normal, you will receive your results only by: [place] (if you have MyChart) OR A paper copy in the mail If you have any lab test that is abnormal or we need to change your treatment, we will call you to review the results.   Testing/Procedures: Your EKG today looked great!  Your physician has requested that you have cardiac CT. Cardiac computed tomography (CT) is a painless test that uses an x-ray machine to take clear, detailed pictures of your heart.  Please follow instruction sheet as given.  Follow-Up: At [facility], you and your health needs are our priority.  As part of our continuing mission to provide you with exceptional heart care, we have created designated Provider Care Teams.  These Care Teams include your primary Cardiologist (physician) and Advanced Practice Providers (APPs -  Physician Assistants and Nurse Practitioners) who all work together to provide you with the care you need, when you need it.  We recommend signing up for the patient portal called "MyChart".  Sign up information is provided on this After Visit Summary.  MyChart is used to connect with patients for Virtual Visits (Telemedicine).  Patients are able to view lab/test results, encounter notes, upcoming appointments, etc.  Non-urgent messages can be sent to your provider as well.   To learn more about what you can do with MyChart, go to [email or web address].    Your next  appointment:   2-3 month(s)  The format for your next appointment:   In Person  Provider:   You may see Carlyle Dolly, MD or one of the following Advanced Practice Providers on your designated Care Team:   Bernerd Pho, PA-C  Ermalinda Barrios, PA-C  Loel Dubonnet, NP   Other Instructions   Your cardiac CT will be scheduled at one of the below locations:   [facility] [address] [place], [place] [address] [phone]   If scheduled at [facility], please arrive at the [facility] Entrance (Entrance C2) of [facility] 30 minutes prior to test start time. You can use the FREE valet parking offered at entrance C (encouraged to control the heart rate for the test)  Proceed to the [facility] Radiology Department (first floor) to check-in and test prep.  All radiology patients and guests should use entrance C2 at [facility], accessed from [facility], even though the hospital's physical address listed is [address]     Please follow these instructions carefully (unless otherwise directed):   On the Night Before the Test: Be sure to Drink plenty of water. Do not consume any caffeinated/decaffeinated beverages or chocolate 12 hours prior to your test. Do not take any antihistamines 12 hours prior to your test.  On the Day of the Test: Drink plenty of water until 1 hour prior to the test. Do not eat any food 4 hours prior to the test. You may  take your regular medications prior to the test.  Take metoprolol (Lopressor) two hours prior to test. FEMALES- please wear underwire-free bra if available, avoid dresses & tight clothing      After the Test: Drink plenty of water. After receiving IV contrast, you may experience a mild flushed feeling. This is normal. On occasion, you may experience a mild rash up to 24 hours after the test. This is not dangerous. If this occurs, you can take Benadryl  25 mg and increase your fluid intake. If you experience trouble breathing, this can be serious. If it is severe call 911 IMMEDIATELY. If it is mild, please call our office. If you take any of these medications: Glipizide/Metformin, Avandament, Glucavance, please do not take 48 hours after completing test unless otherwise instructed.  We will call to schedule your test 2-4 weeks out understanding that some insurance companies will need an authorization prior to the service being performed.   For non-scheduling related questions, please contact the cardiac imaging nurse navigator should you have any questions/concerns: Marchia Bond, Cardiac Imaging Nurse Navigator Gordy Clement, Cardiac Imaging Nurse Navigator [facility] Direct Office Dial: [phone]   For scheduling needs, including cancellations and rescheduling, please call [place], [phone].

## 2022-01-10 DIAGNOSIS — I480 Paroxysmal atrial fibrillation: Secondary | ICD-10-CM | POA: Diagnosis not present

## 2022-01-10 DIAGNOSIS — E876 Hypokalemia: Secondary | ICD-10-CM | POA: Diagnosis not present

## 2022-01-11 ENCOUNTER — Telehealth (HOSPITAL_BASED_OUTPATIENT_CLINIC_OR_DEPARTMENT_OTHER): Payer: Self-pay

## 2022-01-11 DIAGNOSIS — I480 Paroxysmal atrial fibrillation: Secondary | ICD-10-CM

## 2022-01-11 LAB — TSH: TSH: 2.77 u[IU]/mL (ref 0.450–4.500)

## 2022-01-11 LAB — BASIC METABOLIC PANEL
BUN/Creatinine Ratio: 19 (ref 12–28)
BUN: 21 mg/dL (ref 8–27)
CO2: 24 mmol/L (ref 20–29)
Calcium: 11.4 mg/dL — ABNORMAL HIGH (ref 8.7–10.3)
Chloride: 101 mmol/L (ref 96–106)
Creatinine, Ser: 1.13 mg/dL — ABNORMAL HIGH (ref 0.57–1.00)
Glucose: 89 mg/dL (ref 70–99)
Potassium: 4.4 mmol/L (ref 3.5–5.2)
Sodium: 137 mmol/L (ref 134–144)
eGFR: 51 mL/min/{1.73_m2} — ABNORMAL LOW (ref >59)

## 2022-01-11 LAB — MAGNESIUM: Magnesium: 2.1 mg/dL (ref 1.6–2.3)

## 2022-01-11 NOTE — Telephone Encounter (Addendum)
RN returned call to patient and provided the following recommendations. Patient verbalizes understanding. Lab slips mailed to patient.       ----- Message from Loel Dubonnet, NP sent at 01/11/2022  7:59 AM EDT ----- Normal thyroid. Normal electrolytes. Kidney function decreased from prior. Likely dehydration. Ensure staying well hydrated. Would increase PO fluid intake with repeat BMP in 2-3 weeks for monitoring.

## 2022-01-29 ENCOUNTER — Telehealth (HOSPITAL_BASED_OUTPATIENT_CLINIC_OR_DEPARTMENT_OTHER): Payer: Self-pay | Admitting: Family

## 2022-01-29 ENCOUNTER — Telehealth (HOSPITAL_COMMUNITY): Payer: Self-pay | Admitting: Emergency Medicine

## 2022-01-29 DIAGNOSIS — R072 Precordial pain: Secondary | ICD-10-CM

## 2022-01-29 NOTE — Telephone Encounter (Signed)
   Pt states she went to the ER for CP and was given SL nitro and she became sob and fainted. She was told to never take nitro again, therefore she does not want to take nitro for CCTA. Pt aware of the nitro requirement for CCTA and okay to cancel test at this time.  Vella Raring aware and will follow up with patient for alternative plan Clarise Cruz

## 2022-01-29 NOTE — Telephone Encounter (Signed)
Seen in clinic 01/05/22 after ED visit for atypical chest pain. Troponin normal. Considered etiology muscle spasm as described as sharp pain right chest at rest and potassium supplemented.  Cardiac CT ordered to rule out ischemia. However, unable to complete as she does not tolerate nitroglycerin.  Will ask RN staff to contact - if no recurrent chest pain, no indication for ischemic evaluation and follow up as scheduled. If still with chest pain, recommend lexiscan myoview at [place], NP

## 2022-01-29 NOTE — Telephone Encounter (Signed)
RN called patient to provide the following the recommendations. Orders placed and sent to scheduling team, lexi directions sent via mychart       "Seen in clinic 01/05/22 after ED visit for atypical chest pain. Troponin normal. Considered etiology muscle spasm as described as sharp pain right chest at rest and potassium supplemented.   Cardiac CT ordered to rule out ischemia. However, unable to complete as she does not tolerate nitroglycerin.   Will ask RN staff to contact - if no recurrent chest pain, no indication for ischemic evaluation and follow up as scheduled. If still with chest pain, recommend lexiscan myoview at [place], NP "

## 2022-01-30 ENCOUNTER — Ambulatory Visit (HOSPITAL_COMMUNITY): Payer: Medicare Other

## 2022-01-30 ENCOUNTER — Telehealth (HOSPITAL_COMMUNITY): Payer: Self-pay | Admitting: *Deleted

## 2022-01-30 NOTE — Telephone Encounter (Signed)
Patient given detailed instructions per Myocardial Perfusion Study Information Sheet for the test on 02/01/22 Patient notified to arrive 15 minutes early and that it is imperative to arrive on time for appointment to keep from having the test rescheduled.  If you need to cancel or reschedule your appointment, please call the office within 24 hours of your appointment. . Patient verbalized understanding. Kirstie Peri

## 2022-01-31 NOTE — Addendum Note (Signed)
Addended by: Loel Dubonnet on: 01/31/2022 07:36 AM   Modules accepted: Orders

## 2022-02-01 ENCOUNTER — Ambulatory Visit (HOSPITAL_COMMUNITY): Payer: Medicare Other | Attending: Family

## 2022-02-01 DIAGNOSIS — R072 Precordial pain: Secondary | ICD-10-CM | POA: Diagnosis not present

## 2022-02-01 LAB — MYOCARDIAL PERFUSION IMAGING
LV dias vol: 70 mL (ref 46–106)
LV sys vol: 22 mL
Nuc Stress EF: 69 %
Peak HR: 108 {beats}/min
Rest HR: 58 {beats}/min
Rest Nuclear Isotope Dose: 10.9 mCi
SDS: 2
SRS: 0
SSS: 2
ST Depression (mm): 0 mm
Stress Nuclear Isotope Dose: 31.2 mCi
TID: 0.89

## 2022-02-01 MED ORDER — TECHNETIUM TC 99M TETROFOSMIN IV KIT
31.2000 | PACK | Freq: Once | INTRAVENOUS | Status: AC | PRN
  Administered 2022-02-01: 31.2 via INTRAVENOUS

## 2022-02-01 MED ORDER — REGADENOSON 0.4 MG/5ML IV SOLN
0.4000 mg | Freq: Once | INTRAVENOUS | Status: AC
  Administered 2022-02-01: 0.4 mg via INTRAVENOUS

## 2022-02-01 MED ORDER — TECHNETIUM TC 99M TETROFOSMIN IV KIT
10.9000 | PACK | Freq: Once | INTRAVENOUS | Status: AC | PRN
  Administered 2022-02-01: 10.9 via INTRAVENOUS

## 2022-02-02 DIAGNOSIS — I480 Paroxysmal atrial fibrillation: Secondary | ICD-10-CM | POA: Diagnosis not present

## 2022-02-03 LAB — BASIC METABOLIC PANEL
BUN/Creatinine Ratio: 21 (ref 12–28)
BUN: 20 mg/dL (ref 8–27)
CO2: 22 mmol/L (ref 20–29)
Calcium: 10.6 mg/dL — ABNORMAL HIGH (ref 8.7–10.3)
Chloride: 100 mmol/L (ref 96–106)
Creatinine, Ser: 0.96 mg/dL (ref 0.57–1.00)
Glucose: 96 mg/dL (ref 70–99)
Potassium: 4.1 mmol/L (ref 3.5–5.2)
Sodium: 138 mmol/L (ref 134–144)
eGFR: 62 mL/min/{1.73_m2} (ref >59)

## 2022-02-23 DIAGNOSIS — M549 Dorsalgia, unspecified: Secondary | ICD-10-CM | POA: Diagnosis not present

## 2022-02-23 DIAGNOSIS — Z299 Encounter for prophylactic measures, unspecified: Secondary | ICD-10-CM | POA: Diagnosis not present

## 2022-02-23 DIAGNOSIS — I10 Essential (primary) hypertension: Secondary | ICD-10-CM | POA: Diagnosis not present

## 2022-02-23 DIAGNOSIS — Z6835 Body mass index (BMI) 35.0-35.9, adult: Secondary | ICD-10-CM | POA: Diagnosis not present

## 2022-02-23 DIAGNOSIS — I480 Paroxysmal atrial fibrillation: Secondary | ICD-10-CM | POA: Diagnosis not present

## 2022-04-06 DIAGNOSIS — Z23 Encounter for immunization: Secondary | ICD-10-CM | POA: Diagnosis not present

## 2022-04-10 DIAGNOSIS — S46011D Strain of muscle(s) and tendon(s) of the rotator cuff of right shoulder, subsequent encounter: Secondary | ICD-10-CM | POA: Diagnosis not present

## 2022-04-10 DIAGNOSIS — M19011 Primary osteoarthritis, right shoulder: Secondary | ICD-10-CM | POA: Diagnosis not present

## 2022-04-13 ENCOUNTER — Ambulatory Visit: Payer: Medicare Other | Attending: Cardiology | Admitting: Cardiology

## 2022-04-13 ENCOUNTER — Encounter: Payer: Self-pay | Admitting: Cardiology

## 2022-04-13 VITALS — BP 116/62 | HR 53 | Ht 65.0 in | Wt 214.2 lb

## 2022-04-13 DIAGNOSIS — I10 Essential (primary) hypertension: Secondary | ICD-10-CM | POA: Insufficient documentation

## 2022-04-13 DIAGNOSIS — D6869 Other thrombophilia: Secondary | ICD-10-CM | POA: Diagnosis not present

## 2022-04-13 DIAGNOSIS — R079 Chest pain, unspecified: Secondary | ICD-10-CM | POA: Diagnosis not present

## 2022-04-13 DIAGNOSIS — I480 Paroxysmal atrial fibrillation: Secondary | ICD-10-CM | POA: Insufficient documentation

## 2022-04-13 NOTE — Patient Instructions (Signed)

## 2022-04-13 NOTE — Progress Notes (Signed)
Clinical Summary Marissa [name] is a 76 y.o.female seen today for follow up of the following medical problems.   1.PAF -new daignosis during  ER visit 10/09/21 with chest pain and palpitations   - no recent palpitations - compliant with meds - no bleeding on eliquis.       2. HTN - compilnant with meds     3.History of chest pain - 2017 nuclear stress without ischemia -01/2022 nuclear stress: no ischemia  -does water aerobics 3 times a week without symptoms - no recurrences   Past Medical History:  Diagnosis Date   Anxiety    Cancer ([place]) 05/2017   right breast cancer   Family history of breast cancer    Family history of ovarian cancer    Family history of stomach cancer    GERD (gastroesophageal reflux disease)    High cholesterol    History of radiation therapy 07/03/17- 07/31/2017   Right Breast/ 40/05 Gy in 15 fractions, Right Breast boost/ 10 Gy in 5 fractions.    Hypertension    Personal history of radiation therapy 2018/2019     Allergies  Allergen Reactions   Nitrostat [Nitroglycerin] Shortness Of Breath     Current Outpatient Medications  Medication Sig Dispense Refill   amLODipine (NORVASC) 10 MG tablet Take 10 mg by mouth daily.     anastrozole (ARIMIDEX) 1 MG tablet Take 1 tablet (1 mg total) by mouth daily. 90 tablet 3   apixaban (ELIQUIS) 5 MG TABS tablet Take 1 tablet (5 mg total) by mouth 2 (two) times daily. 180 tablet 3   Cholecalciferol (VITAMIN D) 125 MCG (5000 UT) CAPS Take 5,000 Units by mouth daily.     Coenzyme Q10 200 MG capsule Take 200 mg by mouth daily.     diclofenac sodium (VOLTAREN) 1 % GEL Apply 2 g topically 4 (four) times daily as needed. 100 g 0   ezetimibe (ZETIA) 10 MG tablet Take 10 mg by mouth daily.     Krill Oil 500 MG CAPS Take 500 mg by mouth daily.     losartan-hydrochlorothiazide (HYZAAR) 100-25 MG per tablet Take 1 tablet by mouth every morning.      metoprolol succinate (TOPROL XL) 25 MG 24 hr tablet Take 1.5  tablets (37.5 mg total) by mouth daily. 135 tablet 3   metoprolol tartrate (LOPRESSOR) 50 MG tablet Take 1 tablet (50 mg total) by mouth once for 1 dose. Please take 2 hours prior to Cardiac CTA 1 tablet 0   omeprazole (PRILOSEC) 20 MG capsule Take 20 mg by mouth daily.     potassium chloride SA (KLOR-CON M) 20 MEQ tablet Take one tablet daily for 3 days. 3 tablet 0   No current facility-administered medications for this visit.     Past Surgical History:  Procedure Laterality Date   BREAST BIOPSY Right 05/09/2017   x2   BREAST LUMPECTOMY Right 05/20/2017   CARDIAC CATHETERIZATION     2008- negative findings   CATARACT EXTRACTION W/PHACO Right 09/06/2014   Procedure: CATARACT EXTRACTION PHACO AND INTRAOCULAR LENS PLACEMENT; CDE:  3.85;  Surgeon: Williams Che, MD;  [facility];  Service: Ophthalmology;  Laterality: Right;   CATARACT EXTRACTION W/PHACO Left 12/13/2014   Procedure: CATARACT EXTRACTION PHACO AND INTRAOCULAR LENS PLACEMENT LEFT EYE CDE=10.79;  Surgeon: Williams Che, MD;  Location: [facility];  Service: Ophthalmology;  Laterality: Left;   COLONOSCOPY WITH PROPOFOL N/A 10/28/2020   Procedure: COLONOSCOPY WITH PROPOFOL;  Surgeon: Montez Morita, Quillian Quince, MD;  [facility];  Service: Gastroenterology;  Laterality: N/A;  AM   ESOPHAGOGASTRODUODENOSCOPY N/A 05/05/2014   Procedure: ESOPHAGOGASTRODUODENOSCOPY (EGD);  Surgeon: Rogene Houston, MD;  [facility];  Service: Endoscopy;  Laterality: N/A;  255   EUS N/A 06/10/2014   Procedure: UPPER ENDOSCOPIC ULTRASOUND (EUS) LINEAR;  Surgeon: Milus Banister, MD;  [facility];  Service: Endoscopy;  Laterality: N/A;   POLYPECTOMY  10/28/2020   Procedure: POLYPECTOMY;  Surgeon: Harvel Quale, MD;  [facility];  Service: Gastroenterology;;   RADIOACTIVE SEED GUIDED PARTIAL MASTECTOMY WITH AXILLARY SENTINEL LYMPH NODE BIOPSY Right 05/20/2017   Procedure: RIGHT BREAST LUMPECTOMY  WITH RADIOACTIVE SEED AND SENTINEL LYMPH NODE BIOPSY;  Surgeon: Rolm Bookbinder, MD;  Location: [place];  Service: General;  Laterality: Right;   TUBAL LIGATION       Allergies  Allergen Reactions   Nitrostat [Nitroglycerin] Shortness Of Breath      Family History  Problem Relation Age of Onset   CAD Mother 74   Leukemia Father    Parkinson's disease Father    Breast cancer Daughter 44       BRCA neg   Heart Problems Maternal Aunt    Cancer Maternal Uncle        NOS   Cancer Paternal Uncle        NOS   Diabetes Maternal Grandfather    Liver cancer Maternal Grandfather    Cancer Paternal Grandmother        NOS   Brain cancer Maternal Aunt    Lung cancer Maternal Aunt        smoker   Cancer Maternal Uncle        NOS   Stomach cancer Maternal Uncle    Lung cancer Maternal Uncle        smoker   Breast cancer Cousin        mat first cousin   Breast cancer Cousin        mat first cousin dx under 60   Breast cancer Cousin        2 additional mat first cousins   Ovarian cancer Cousin        mat first cousin   Cancer Cousin        several female mat first cousins with cancer NOS   Breast cancer Cousin        2 pat first cousins   Cancer Cousin        3 pat first cousins with cancer NOS     Social History Marissa [name] reports that she has never smoked. She has never used smokeless tobacco. Marissa [name] reports no history of alcohol use.   Review of Systems CONSTITUTIONAL: No weight loss, fever, chills, weakness or fatigue.  HEENT: Eyes: No visual loss, blurred vision, double vision or yellow sclerae.No hearing loss, sneezing, congestion, runny nose or sore throat.  SKIN: No rash or itching.  CARDIOVASCULAR: per hpi RESPIRATORY: No shortness of breath, cough or sputum.  GASTROINTESTINAL: No anorexia, nausea, vomiting or diarrhea. No abdominal pain or blood.  GENITOURINARY: No burning on urination, no polyuria NEUROLOGICAL: No headache, dizziness,  syncope, paralysis, ataxia, numbness or tingling in the extremities. No change in bowel or bladder control.  MUSCULOSKELETAL: No muscle, back pain, joint pain or stiffness.  LYMPHATICS: No enlarged nodes. No history of splenectomy.  PSYCHIATRIC: No history of depression or anxiety.  ENDOCRINOLOGIC: No reports of sweating, cold  or heat intolerance. No polyuria or polydipsia.  Marland Kitchen   Physical Examination Today's Vitals   04/13/22 0804  BP: 116/62  Pulse: (!) 53  SpO2: 99%  Weight: 214 lb 3.2 oz (97.2 kg)  Height: _0  (1.651 m)   Body mass index is 35.64 kg/m.  Gen: resting comfortably, no acute distress HEENT: no scleral icterus, pupils equal round and reactive, no palptable cervical adenopathy,  CV: RRR, no m/r/g no jvd Resp: Clear to auscultation bilaterally GI: abdomen is soft, non-tender, non-distended, normal bowel sounds, no hepatosplenomegaly MSK: extremities are warm, no edema.  Skin: warm, no rash Neuro:  no focal deficits Psych: appropriate affect   Diagnostic Studies   06/2016 nuclear stress There was no ST segment deviation noted during stress. The study is normal. No gross ischemic territories nor evidence of scar. This is a low risk study. Nuclear stress EF: 60%.     06/2016 echo Study Conclusions   - Left ventricle: The cavity size was normal. Wall thickness was    normal. Systolic function was normal. The estimated ejection    fraction was in the range of 60% to 65%. Wall motion was normal;    there were no regional wall motion abnormalities. Doppler    parameters are consistent with abnormal left ventricular    relaxation (grade 1 diastolic dysfunction).  - Left atrium: The atrium was mildly dilated.  - Tricuspid valve: There was mild regurgitation.   10/2021 echo IMPRESSIONS     1. Left ventricular ejection fraction, by estimation, is 60 to 65%. The  left ventricle has normal function. The left ventricle has no regional  wall motion  abnormalities. Left ventricular diastolic parameters are  consistent with Grade I diastolic  dysfunction (impaired relaxation).   2. Right ventricular systolic function is normal. The right ventricular  size is normal. There is normal pulmonary artery systolic pressure.   3. The mitral valve is normal in structure. No evidence of mitral valve  regurgitation. No evidence of mitral stenosis.   4. The aortic valve is tricuspid. Aortic valve regurgitation is not  visualized. No aortic stenosis is present.   5. The inferior vena cava is normal in size with greater than 50%  respiratory variability, suggesting right atrial pressure of 3 mmHg.   01/2022 nuclear stress Normal resting and stress perfusion. No ischemia or infarction EF 69%  Assessment and Plan   1.PAF/acquired thrombophilia -no symptoms - continue current meds includnig eliquis for stroke prevention  2.HTN - at goal, continue current meds  3. Chest pain - resolved, recent nuclear stress without ischemia - monitor at this time   F/u 6 months   Arnoldo Lenis, M.D.,

## 2022-04-17 DIAGNOSIS — H43813 Vitreous degeneration, bilateral: Secondary | ICD-10-CM | POA: Diagnosis not present

## 2022-05-21 ENCOUNTER — Ambulatory Visit
Admission: RE | Admit: 2022-05-21 | Discharge: 2022-05-21 | Disposition: A | Discharge status: Home / Self Care | Payer: Medicare Other | Source: Ambulatory Visit | Attending: Hematology | Admitting: Hematology

## 2022-05-21 ENCOUNTER — Other Ambulatory Visit (HOSPITAL_COMMUNITY): Payer: Self-pay | Admitting: Hematology

## 2022-05-21 DIAGNOSIS — Z1231 Encounter for screening mammogram for malignant neoplasm of breast: Secondary | ICD-10-CM

## 2022-05-21 DIAGNOSIS — E559 Vitamin D deficiency, unspecified: Secondary | ICD-10-CM

## 2022-05-21 DIAGNOSIS — Z170 Estrogen receptor positive status [ER+]: Secondary | ICD-10-CM

## 2022-05-21 DIAGNOSIS — M818 Other osteoporosis without current pathological fracture: Secondary | ICD-10-CM

## 2022-05-23 ENCOUNTER — Other Ambulatory Visit: Payer: Self-pay

## 2022-05-23 DIAGNOSIS — E559 Vitamin D deficiency, unspecified: Secondary | ICD-10-CM

## 2022-05-23 DIAGNOSIS — M818 Other osteoporosis without current pathological fracture: Secondary | ICD-10-CM

## 2022-05-23 DIAGNOSIS — Z170 Estrogen receptor positive status [ER+]: Secondary | ICD-10-CM

## 2022-05-28 DIAGNOSIS — M818 Other osteoporosis without current pathological fracture: Secondary | ICD-10-CM | POA: Diagnosis not present

## 2022-05-28 DIAGNOSIS — Z170 Estrogen receptor positive status [ER+]: Secondary | ICD-10-CM | POA: Diagnosis not present

## 2022-05-28 DIAGNOSIS — E559 Vitamin D deficiency, unspecified: Secondary | ICD-10-CM | POA: Diagnosis not present

## 2022-05-28 DIAGNOSIS — C50411 Malignant neoplasm of upper-outer quadrant of right female breast: Secondary | ICD-10-CM | POA: Diagnosis not present

## 2022-06-13 ENCOUNTER — Inpatient Hospital Stay: Payer: Medicare Other | Attending: Hematology | Admitting: Hematology

## 2022-06-13 VITALS — BP 151/68 | HR 71 | Temp 98.0°F | Resp 17 | Ht 65.0 in | Wt 216.6 lb

## 2022-06-13 DIAGNOSIS — C50411 Malignant neoplasm of upper-outer quadrant of right female breast: Secondary | ICD-10-CM | POA: Insufficient documentation

## 2022-06-13 DIAGNOSIS — Z79811 Long term (current) use of aromatase inhibitors: Secondary | ICD-10-CM | POA: Diagnosis not present

## 2022-06-13 DIAGNOSIS — M8580 Other specified disorders of bone density and structure, unspecified site: Secondary | ICD-10-CM | POA: Insufficient documentation

## 2022-06-13 DIAGNOSIS — E559 Vitamin D deficiency, unspecified: Secondary | ICD-10-CM

## 2022-06-13 DIAGNOSIS — Z170 Estrogen receptor positive status [ER+]: Secondary | ICD-10-CM | POA: Diagnosis not present

## 2022-06-13 DIAGNOSIS — Z1231 Encounter for screening mammogram for malignant neoplasm of breast: Secondary | ICD-10-CM

## 2022-06-13 MED ORDER — ANASTROZOLE 1 MG PO TABS: 1.0000 mg | ORAL_TABLET | Freq: Every day | ORAL | 3 refills | Status: DC

## 2022-06-13 NOTE — Patient Instructions (Addendum)
[place] at [facility] Discharge Instructions   You were seen and examined today by Dr. Delton Coombes.  He reviewed the results of your lab work which is mostly normal/stable. Your calcium remains high. Dr. Raliegh Ip would like for you to cut back on taking Vitamin D to once a week.   We will see you back in 6 months. We will repeat lab work prior to your next visit.     Thank you for choosing [place] at [facility] to provide your oncology and hematology care.  To afford each patient quality time with our provider, please arrive at least 15 minutes before your scheduled appointment time.   If you have a lab appointment with the [facility] please come in thru the Main Entrance and check in at the main information desk.  You need to re-schedule your appointment should you arrive 10 or more minutes late.  We strive to give you quality time with our providers, and arriving late affects you and other patients whose appointments are after yours.  Also, if you no show three or more times for appointments you may be dismissed from the clinic at the providers discretion.     Again, thank you for choosing [facility].  Our hope is that these requests will decrease the amount of time that you wait before being seen by our physicians.       _____________________________________________________________  Should you have questions after your visit to [facility], please contact our office at [phone] and follow the prompts.  Our office hours are 8:00 a.m. and 4:30 p.m. Monday - Friday.  Please note that voicemails left after 4:00 p.m. may not be returned until the following business day.  We are closed weekends and major holidays.  You do have access to a nurse 24-7, just call the main number to the clinic [phone] and do not press any options, hold on the line and a nurse will answer the phone.    For prescription refill  requests, have your pharmacy contact our office and allow 72 hours.    Due to Covid, you will need to wear a mask upon entering the hospital. If you do not have a mask, a mask will be given to you at the Main Entrance upon arrival. For doctor visits, patients may have 1 support person age 30 or older with them. For treatment visits, patients can not have anyone with them due to social distancing guidelines and our immunocompromised population.

## 2022-06-13 NOTE — Progress Notes (Signed)
[facility] [address], [place] [address]   Patient Care Team: Monico Blitz, MD as PCP - General (Internal Medicine) Arnoldo Lenis, MD as PCP - Cardiology (Cardiology)  SUMMARY OF ONCOLOGIC HISTORY: Oncology History  Carcinoma of upper-outer quadrant of right breast in female, estrogen receptor positive ([place])  06/11/2017 Initial Diagnosis   Carcinoma of upper-outer quadrant of right breast in female, estrogen receptor positive ([place])   07/08/2017 Genetic Testing   Negative genetic testing on the Multi-cancer panel.  The Multi-Gene Panel offered by [facility] includes sequencing and/or deletion duplication testing of the following 83 genes: ALK, APC, ATM, AXIN2,BAP1,  BARD1, BLM, BMPR1A, BRCA1, BRCA2, BRIP1, CASR, CDC73, CDH1, CDK4, CDKN1B, CDKN1C, CDKN2A (p14ARF), CDKN2A (p16INK4a), CEBPA, CHEK2, CTNNA1, DICER1, DIS3L2, EGFR (c.2369C>T, p.Thr790Met variant only), EPCAM (Deletion/duplication testing only), FH, FLCN, GATA2, GPC3, GREM1 (Promoter region deletion/duplication testing only), HOXB13 (c.251G>A, p.Gly84Glu), HRAS, KIT, MAX, MEN1, MET, MITF (c.952G>A, p.Glu318Lys variant only), MLH1, MSH2, MSH3, MSH6, MUTYH, NBN, NF1, NF2, NTHL1, PALB2, PDGFRA, PHOX2B, PMS2, POLD1, POLE, POT1, PRKAR1A, PTCH1, PTEN, RAD50, RAD51C, RAD51D, RB1, RECQL4, RET, RUNX1, SDHAF2, SDHA (sequence changes only), SDHB, SDHC, SDHD, SMAD4, SMARCA4, SMARCB1, SMARCE1, STK11, SUFU, TERT, TERT, TMEM127, TP53, TSC1, TSC2, VHL, WRN and WT1.  The report date is July 08, 2017.      CHIEF COMPLIANT: Follow up for right breast cancer   INTERVAL HISTORY: [name] is a 76 y.o. female seen for follow-up of right breast cancer.  She is tolerating anastrozole very well.  Chronic back pain has been stable.  REVIEW OF SYSTEMS:   Review of Systems  Constitutional:  Negative for appetite change and fatigue.  Musculoskeletal:  Positive for back pain (4/10).  All other systems reviewed and are  negative.   I have reviewed the past medical history, past surgical history, social history and family history with the patient and they are unchanged from previous note.   ALLERGIES:   is allergic to nitrostat [nitroglycerin].   MEDICATIONS:  Current Outpatient Medications  Medication Sig Dispense Refill   amLODipine (NORVASC) 10 MG tablet Take 10 mg by mouth daily.     anastrozole (ARIMIDEX) 1 MG tablet Take 1 tablet (1 mg total) by mouth daily. 90 tablet 3   apixaban (ELIQUIS) 5 MG TABS tablet Take 1 tablet (5 mg total) by mouth 2 (two) times daily. 180 tablet 3   Cholecalciferol (VITAMIN D) 125 MCG (5000 UT) CAPS Take 5,000 Units by mouth daily.     Coenzyme Q10 200 MG capsule Take 200 mg by mouth daily.     diclofenac sodium (VOLTAREN) 1 % GEL Apply 2 g topically 4 (four) times daily as needed. 100 g 0   ezetimibe (ZETIA) 10 MG tablet Take 10 mg by mouth daily.     Krill Oil 500 MG CAPS Take 500 mg by mouth daily.     losartan-hydrochlorothiazide (HYZAAR) 100-25 MG per tablet Take 1 tablet by mouth every morning.      metoprolol succinate (TOPROL XL) 25 MG 24 hr tablet Take 1.5 tablets (37.5 mg total) by mouth daily. 135 tablet 3   omeprazole (PRILOSEC) 20 MG capsule Take 20 mg by mouth daily.     No current facility-administered medications for this visit.     PHYSICAL EXAMINATION: Performance status (ECOG): 1 - Symptomatic but completely ambulatory  There were no vitals filed for this visit.  Wt Readings from Last 3 Encounters:  04/13/22 214 lb 3.2 oz (97.2 kg)  02/01/22 213 lb (96.6 kg)  01/05/22 213 lb (96.6 kg)   Physical Exam Vitals reviewed.  Constitutional:      Appearance: Normal appearance. She is obese.  Cardiovascular:     Rate and Rhythm: Normal rate and regular rhythm.     Pulses: Normal pulses.     Heart sounds: Normal heart sounds.  Pulmonary:     Effort: Pulmonary effort is normal.     Breath sounds: Normal breath sounds.  Chest:  Breasts:     Right: No swelling, bleeding, inverted nipple, mass, nipple discharge, skin change (UOQ lumpectomy scar) or tenderness.     Left: No swelling, bleeding, inverted nipple, mass, nipple discharge, skin change or tenderness.  Musculoskeletal:     Right lower leg: No edema.     Left lower leg: No edema.  Neurological:     General: No focal deficit present.     Mental Status: She is alert and oriented to person, place, and time.  Psychiatric:        Mood and Affect: Mood normal.        Behavior: Behavior normal.    Breast Exam Chaperone: Thana Ates     LABORATORY DATA:  I have reviewed the data as listed    Latest Ref Rng & Units 02/02/2022    8:03 AM 01/10/2022    9:29 AM 12/31/2021    4:49 AM  CMP  Glucose 70 - 99 mg/dL 96  89  117   BUN 8 - 27 mg/dL _0 Creatinine 0.57 - 1.00 mg/dL 0.96  1.13  0.89   Sodium 134 - 144 mmol/L 138  137  138   Potassium 3.5 - 5.2 mmol/L 4.1  4.4  3.4   Chloride 96 - 106 mmol/L 100  101  104   CO2 20 - 29 mmol/L _1 Calcium 8.7 - 10.3 mg/dL 10.6  11.4  10.4   Total Protein 6.5 - 8.1 g/dL   7.2   Total Bilirubin 0.3 - 1.2 mg/dL   0.9   Alkaline Phos 38 - 126 U/L   99   AST 15 - 41 U/L   18   ALT 0 - 44 U/L   19    Lab Results  Component Value Date   CAN153 7.4 05/12/2020   CAN153 7.1 11/10/2019   CAN153 6.6 05/13/2019   Lab Results  Component Value Date   WBC 6.3 12/31/2021   HGB 14.2 12/31/2021   HCT 42.9 12/31/2021   MCV 92.1 12/31/2021   PLT 247 12/31/2021   NEUTROABS 4.4 05/12/2020    ASSESSMENT:  1.  Right breast infiltrating ductal carcinoma, ER/PR positive HER-2 negative: -Lumpectomy on 06/11/2017, 1.4 cm, 0/2 sentinel lymph nodes positive, margins negative, ER/PR positive, HER-2 negative, Ki-67 5%. -Oncotype DX recurrence score of 10.  Negative genetic testing for hereditary breast cancer. -Radiation therapy finished in January 2019.  Anastrozole started on 09/06/2017. -Mammogram from 05/16/2020 was BI-RADS  Category 2.   2.  Osteopenia: -DEXA scan at Dr. Trena Platt office on 08/07/2018 shows T score -1.4. - DEXA scan (08/19/2020): T-score -2.1.   3.  Right ovarian cyst and left renal lesion: -Recent pelvic ultrasound on 04/17/2019 shows right ovary entirely occupied by a simple cyst measuring 3.3 x 2.4 x 1.7 cm.  This has benign characteristics.  CA-125 done by Dr. Manuella Ghazi on 05/13/2019 was 6.1. -Ultrasound of the abdomen on 04/17/2019 shows multiple parapelvic renal cysts  bilaterally with cortical thinning and increased echogenicity.  Upper pole left renal cyst similar to that visualized on prior CT scan.  Fatty infiltration of the liver present.   PLAN:  1.  Right breast infiltrating ductal carcinoma, ER/PR positive HER-2 negative: - Lumpectomy scar in the upper outer quadrant of the right breast is stable.  No palpable masses. - Mammogram on 05/21/2022 reviewed by me was BI-RADS Category 1. - Labs reviewed by me shows normal CBC.  CA 15-3 was 7.7. - Continue anastrozole daily for a total of 10 years. - RTC 6 months for follow-up.   2.  Osteopenia: - At last visit she cut back vitamin D to  2000 units daily. - Reviewed labs today which showed calcium 11.  Vitamin D level is 58.5, up from 25 previously. - Recommend cutting back on vitamin D to 2000 units once a week.  Will check in 6 months. - Reviewed her DEXA scan dated 08/19/2020 from Dr. Trena Platt office.  T-score is -2.1, osteopenia, slightly worse from 2020. - Recommend repeating DEXA scan in February 2024.  If there is any worsening, consider bisphosphonates.  Breast Cancer therapy associated bone loss: I have recommended calcium, Vitamin D and weight bearing exercises.  Orders placed this encounter:  No orders of the defined types were placed in this encounter.   The patient has a good understanding of the overall plan. She agrees with it. She will call with any problems that may develop before the next visit here.  Derek Jack,  MD [facility] [phone]

## 2022-07-10 DIAGNOSIS — J069 Acute upper respiratory infection, unspecified: Secondary | ICD-10-CM | POA: Diagnosis not present

## 2022-07-10 DIAGNOSIS — Z789 Other specified health status: Secondary | ICD-10-CM | POA: Diagnosis not present

## 2022-07-10 DIAGNOSIS — I10 Essential (primary) hypertension: Secondary | ICD-10-CM | POA: Diagnosis not present

## 2022-07-10 DIAGNOSIS — R509 Fever, unspecified: Secondary | ICD-10-CM | POA: Diagnosis not present

## 2022-07-10 DIAGNOSIS — Z299 Encounter for prophylactic measures, unspecified: Secondary | ICD-10-CM | POA: Diagnosis not present

## 2022-07-10 DIAGNOSIS — E7800 Pure hypercholesterolemia, unspecified: Secondary | ICD-10-CM | POA: Diagnosis not present

## 2022-07-10 DIAGNOSIS — Z6835 Body mass index (BMI) 35.0-35.9, adult: Secondary | ICD-10-CM | POA: Diagnosis not present

## 2022-07-23 DIAGNOSIS — H00024 Hordeolum internum left upper eyelid: Secondary | ICD-10-CM | POA: Diagnosis not present

## 2022-08-11 ENCOUNTER — Encounter (HOSPITAL_COMMUNITY): Payer: Self-pay

## 2022-08-11 ENCOUNTER — Emergency Department (HOSPITAL_COMMUNITY)
Admission: EM | Admit: 2022-08-11 | Discharge: 2022-08-11 | Disposition: A | Discharge status: Home / Self Care | Payer: Medicare Other | Attending: Emergency Medicine | Admitting: Emergency Medicine

## 2022-08-11 ENCOUNTER — Other Ambulatory Visit: Payer: Self-pay

## 2022-08-11 ENCOUNTER — Emergency Department (HOSPITAL_COMMUNITY): Payer: Medicare Other

## 2022-08-11 DIAGNOSIS — R079 Chest pain, unspecified: Secondary | ICD-10-CM | POA: Diagnosis not present

## 2022-08-11 DIAGNOSIS — Z7901 Long term (current) use of anticoagulants: Secondary | ICD-10-CM | POA: Diagnosis not present

## 2022-08-11 DIAGNOSIS — Z1152 Encounter for screening for COVID-19: Secondary | ICD-10-CM | POA: Insufficient documentation

## 2022-08-11 DIAGNOSIS — R0789 Other chest pain: Secondary | ICD-10-CM | POA: Diagnosis not present

## 2022-08-11 DIAGNOSIS — Z853 Personal history of malignant neoplasm of breast: Secondary | ICD-10-CM | POA: Diagnosis not present

## 2022-08-11 DIAGNOSIS — R0602 Shortness of breath: Secondary | ICD-10-CM | POA: Diagnosis not present

## 2022-08-11 DIAGNOSIS — E876 Hypokalemia: Secondary | ICD-10-CM | POA: Diagnosis not present

## 2022-08-11 DIAGNOSIS — J9811 Atelectasis: Secondary | ICD-10-CM | POA: Diagnosis not present

## 2022-08-11 DIAGNOSIS — R002 Palpitations: Secondary | ICD-10-CM | POA: Insufficient documentation

## 2022-08-11 HISTORY — DX: Unspecified atrial fibrillation: I48.91

## 2022-08-11 LAB — URINALYSIS, ROUTINE W REFLEX MICROSCOPIC
Bacteria, UA: NONE SEEN
Bilirubin Urine: NEGATIVE
Glucose, UA: NEGATIVE mg/dL
Ketones, ur: NEGATIVE mg/dL
Leukocytes,Ua: NEGATIVE
Nitrite: NEGATIVE
Protein, ur: NEGATIVE mg/dL
Specific Gravity, Urine: 1.006 (ref 1.005–1.030)
pH: 8 (ref 5.0–8.0)

## 2022-08-11 LAB — MAGNESIUM: Magnesium: 1.9 mg/dL (ref 1.7–2.4)

## 2022-08-11 LAB — RESP PANEL BY RT-PCR (RSV, FLU A&B, COVID)  RVPGX2
Influenza A by PCR: NEGATIVE
Influenza B by PCR: NEGATIVE
Resp Syncytial Virus by PCR: NEGATIVE
SARS Coronavirus 2 by RT PCR: NEGATIVE

## 2022-08-11 LAB — COMPREHENSIVE METABOLIC PANEL
ALT: 17 U/L (ref 0–44)
AST: 20 U/L (ref 15–41)
Albumin: 4.4 g/dL (ref 3.5–5.0)
Alkaline Phosphatase: 103 U/L (ref 38–126)
Anion gap: 14 (ref 5–15)
BUN: 23 mg/dL (ref 8–23)
CO2: 20 mmol/L — ABNORMAL LOW (ref 22–32)
Calcium: 10.6 mg/dL — ABNORMAL HIGH (ref 8.9–10.3)
Chloride: 102 mmol/L (ref 98–111)
Creatinine, Ser: 0.92 mg/dL (ref 0.44–1.00)
GFR, Estimated: >60 mL/min (ref >60)
Glucose, Bld: 110 mg/dL — ABNORMAL HIGH (ref 70–99)
Potassium: 3.1 mmol/L — ABNORMAL LOW (ref 3.5–5.1)
Sodium: 136 mmol/L (ref 135–145)
Total Bilirubin: 1.1 mg/dL (ref 0.3–1.2)
Total Protein: 7.9 g/dL (ref 6.5–8.1)

## 2022-08-11 LAB — CBC WITH DIFFERENTIAL/PLATELET
Abs Immature Granulocytes: 0.02 10*3/uL (ref 0.00–0.07)
Basophils Absolute: 0 10*3/uL (ref 0.0–0.1)
Basophils Relative: 0 %
Eosinophils Absolute: 0.1 10*3/uL (ref 0.0–0.5)
Eosinophils Relative: 1 %
HCT: 46.6 % — ABNORMAL HIGH (ref 36.0–46.0)
Hemoglobin: 15.7 g/dL — ABNORMAL HIGH (ref 12.0–15.0)
Immature Granulocytes: 0 %
Lymphocytes Relative: 25 %
Lymphs Abs: 1.6 10*3/uL (ref 0.7–4.0)
MCH: 30.8 pg (ref 26.0–34.0)
MCHC: 33.7 g/dL (ref 30.0–36.0)
MCV: 91.4 fL (ref 80.0–100.0)
Monocytes Absolute: 0.3 10*3/uL (ref 0.1–1.0)
Monocytes Relative: 5 %
Neutro Abs: 4.2 10*3/uL (ref 1.7–7.7)
Neutrophils Relative %: 69 %
Platelets: 248 10*3/uL (ref 150–400)
RBC: 5.1 MIL/uL (ref 3.87–5.11)
RDW: 11.8 % (ref 11.5–15.5)
WBC: 6.2 10*3/uL (ref 4.0–10.5)
nRBC: 0 % (ref 0.0–0.2)

## 2022-08-11 LAB — TROPONIN I (HIGH SENSITIVITY)
Troponin I (High Sensitivity): 8 ng/L (ref <18)
Troponin I (High Sensitivity): 7 ng/L (ref <18)

## 2022-08-11 LAB — BRAIN NATRIURETIC PEPTIDE: B Natriuretic Peptide: 56 pg/mL (ref 0.0–100.0)

## 2022-08-11 LAB — LIPASE, BLOOD: Lipase: 29 U/L (ref 11–51)

## 2022-08-11 MED ORDER — POTASSIUM CHLORIDE 10 MEQ/100ML IV SOLN
10.0000 meq | INTRAVENOUS | Status: AC
  Administered 2022-08-11 (×2): 10 meq via INTRAVENOUS
  Filled 2022-08-11 (×2): qty 100

## 2022-08-11 MED ORDER — POTASSIUM CHLORIDE CRYS ER 10 MEQ PO TBCR: 10.0000 meq | EXTENDED_RELEASE_TABLET | Freq: Every day | ORAL | 0 refills | Status: DC

## 2022-08-11 MED ORDER — ONDANSETRON HCL 4 MG/2ML IJ SOLN
4.0000 mg | Freq: Once | INTRAMUSCULAR | Status: AC
  Administered 2022-08-11: 4 mg via INTRAVENOUS
  Filled 2022-08-11: qty 2

## 2022-08-11 NOTE — ED Notes (Signed)
Pt ambulated to the restroom with minimal assistance. She shows no signs of distress and reports no change in breathing or headache.

## 2022-08-11 NOTE — ED Provider Notes (Signed)
[facility] Provider Note   CSN: [id number] Arrival date & time: 08/11/22  0827     History  Chief Complaint  Patient presents with   Shortness of Breath    Marissa [name] is a 77 y.o. female.  She is here with a complaint of shortness of breath dyspnea on exertion and rapid heart rate that started last evening.  Worse with any activity.  She also says she feels little nauseous and has been belching up some foul tasting stuff.  She has had an on-and-off headache.  She denies any chest pain or abdominal pain.  No diarrhea or constipation no urinary symptoms.  No fevers or chills.  She said she has had A-fib before, is on a blood thinner Eliquis.  She has a history of breast cancer and is on oral anastrozole  The history is provided by the patient.  Shortness of Breath Severity:  Moderate Onset quality:  Gradual Duration:  2 days Timing:  Intermittent Progression:  Unchanged Chronicity:  New Context: activity   Relieved by:  Rest Worsened by:  Activity Ineffective treatments:  None tried Associated symptoms: headaches   Associated symptoms: no abdominal pain, no chest pain, no cough, no diaphoresis, no fever, no hemoptysis, no sputum production and no vomiting   Risk factors: no tobacco use        Home Medications Prior to Admission medications   Medication Sig Start Date End Date Taking? Authorizing Provider  amLODipine (NORVASC) 10 MG tablet Take 10 mg by mouth daily.    [provider]  anastrozole (ARIMIDEX) 1 MG tablet Take 1 tablet (1 mg total) by mouth daily. 06/13/22   Derek Jack, MD  apixaban (ELIQUIS) 5 MG TABS tablet Take 1 tablet (5 mg total) by mouth 2 (two) times daily. 10/12/21 04/14/23  Arnoldo Lenis, MD  Cholecalciferol (VITAMIN D) 125 MCG (5000 UT) CAPS Take 5,000 Units by mouth daily.    [provider]  Coenzyme Q10 200 MG capsule Take 200 mg by mouth daily.    [provider]  diclofenac sodium (VOLTAREN) 1 % GEL Apply 2 g topically 4 (four) times daily as needed. 04/16/19   Long, Wonda Olds, MD  ezetimibe (ZETIA) 10 MG tablet Take 10 mg by mouth daily. 09/08/21   [provider]  Javier Docker Oil 500 MG CAPS Take 500 mg by mouth daily.    [provider]  losartan-hydrochlorothiazide (HYZAAR) 100-25 MG per tablet Take 1 tablet by mouth every morning.     [provider]  metoprolol succinate (TOPROL XL) 25 MG 24 hr tablet Take 1.5 tablets (37.5 mg total) by mouth daily. 10/12/21   Arnoldo Lenis, MD  omeprazole (PRILOSEC) 20 MG capsule Take 20 mg by mouth daily.    [provider]      Allergies    Nitrostat [nitroglycerin]    Review of Systems   Review of Systems  Constitutional:  Negative for diaphoresis and fever.  Respiratory:  Positive for shortness of breath. Negative for cough, hemoptysis and sputum production.   Cardiovascular:  Negative for chest pain.  Gastrointestinal:  Positive for nausea. Negative for abdominal pain, constipation, diarrhea and vomiting.  Genitourinary:  Negative for dysuria.  Neurological:  Positive for headaches.    Physical Exam Updated Vital Signs BP (!) 156/72 (BP Location: Left Arm)   Pulse 98   Temp 97.9 F (36.6 C) (Oral)   Resp 16   Ht 5'  5" (1.651 m)   Wt 97.5 kg   SpO2 100%   BMI 35.78 kg/m  Physical Exam Vitals and nursing note reviewed.  Constitutional:      General: She is not in acute distress.    Appearance: She is well-developed.  HENT:     Head: Normocephalic and atraumatic.  Eyes:     Conjunctiva/sclera: Conjunctivae normal.  Cardiovascular:     Rate and Rhythm: Regular rhythm. Tachycardia present.     Heart sounds: No murmur heard. Pulmonary:     Effort: Pulmonary effort is normal. No respiratory distress.     Breath sounds: Normal breath sounds.  Abdominal:     Palpations: Abdomen is soft.     Tenderness: There is no abdominal tenderness. There is no guarding  or rebound.  Musculoskeletal:        General: No swelling.     Cervical back: Neck supple.     Right lower leg: No tenderness. No edema.     Left lower leg: No tenderness. No edema.  Skin:    General: Skin is warm and dry.     Capillary Refill: Capillary refill takes less than 2 seconds.  Neurological:     General: No focal deficit present.     Mental Status: She is alert.     ED Results / Procedures / Treatments   Labs (all labs ordered are listed, but only abnormal results are displayed) Labs Reviewed  COMPREHENSIVE METABOLIC PANEL - Abnormal; Notable for the following components:      Result Value   Potassium 3.1 (*)    CO2 20 (*)    Glucose, Bld 110 (*)    Calcium 10.6 (*)    All other components within normal limits  CBC WITH DIFFERENTIAL/PLATELET - Abnormal; Notable for the following components:   Hemoglobin 15.7 (*)    HCT 46.6 (*)    All other components within normal limits  URINALYSIS, ROUTINE W REFLEX MICROSCOPIC - Abnormal; Notable for the following components:   Color, Urine STRAW (*)    Hgb urine dipstick SMALL (*)    All other components within normal limits  RESP PANEL BY RT-PCR (RSV, FLU A&B, COVID)  RVPGX2  LIPASE, BLOOD  BRAIN NATRIURETIC PEPTIDE  MAGNESIUM  TROPONIN I (HIGH SENSITIVITY)  TROPONIN I (HIGH SENSITIVITY)    EKG EKG Interpretation  Date/Time:  Saturday August 11 2022 08:42:34 EST Ventricular Rate:  102 PR Interval:  147 QRS Duration: 89 QT Interval:  400 QTC Calculation: 514 R Axis:   -2 Text Interpretation: Sinus tachycardia Low voltage, precordial leads Anteroseptal infarct, old Minimal ST depression, lateral leads Prolonged QT interval Confirmed by Aletta Edouard [phone]) on 08/11/2022 8:51:14 AM  Radiology DG Chest Port 1 View  Result Date: 08/11/2022 CLINICAL DATA:  Shortness of breath EXAM: PORTABLE CHEST 1 VIEW COMPARISON:  12/31/2021 FINDINGS: 0906 hours. The lungs are clear without focal pneumonia, edema, pneumothorax or  pleural effusion. Trace atelectasis noted left lung base. The cardiopericardial silhouette is within normal limits for size. The visualized bony structures of the thorax are unremarkable. Telemetry leads overlie the chest. IMPRESSION: Trace atelectasis noted left lung base. Otherwise no acute findings. Electronically Signed   By: Misty Stanley M.D.   On: 08/11/2022 09:27    Procedures Procedures    Medications Ordered in ED Medications  ondansetron [facility]) injection 4 mg (4 mg Intravenous Given 08/11/22 0919)  potassium chloride 10 mEq in 100 mL IVPB (0 mEq Intravenous Stopped 08/11/22 1157)  ED Course/ Medical Decision Making/ A&P Clinical Course as of 08/11/22 1721  Sat Aug 11, 2022  0921 Chest x-ray interpreted by me as no acute infiltrate.  Awaiting radiology reading. [MB]  1227 Patient states she is feeling better and does not feel the tightness and discomfort in her chest and throat now.  I reviewed the results of her lab work with her.  Recommended close follow-up with her PCP and cardiologist.  Return instructions discussed [MB]    Clinical Course User Index [MB] Hayden Rasmussen, MD                             Medical Decision Making Amount and/or Complexity of Data Reviewed Labs: ordered. Radiology: ordered.  Risk Prescription drug management.   This patient complains of palpitations chest pain shortness of breath; this involves an extensive number of treatment Options and is a complaint that carries with it a high risk of complications and morbidity. The differential includes arrhythmia, A-fib, ACS, pneumonia, pneumothorax, PE, anemia  I ordered, reviewed and interpreted labs, which included CBC with normal white count stable hemoglobin, chemistries with low potassium normal renal function, urinalysis without signs of infection, troponins flat, COVID and flu negative I ordered medication IV Zofran IV potassium and reviewed PMP when indicated. I ordered imaging studies  which included chest x-ray and I independently    visualized and interpreted imaging which showed no acute infiltrate Additional history obtained from patient's daughter Previous records obtained and reviewed in epic including recent cardiology notes Cardiac monitoring reviewed, sinus rhythm with frequent PVCs improved to normal sinus rhythm Social determinants considered, patient physically inactive Critical Interventions: None  After the interventions stated above, I reevaluated the patient and found patient to be symptomatically improved after potassium and decreased ectopy Admission and further testing considered, no indications for admission at this time.  No evidence of ACS and doubt PE as has been taking her anticoagulation.  No signs of infection.  Ectopy improved after repletion of potassium.  Recommended close follow-up with outpatient treatment team.  Return instructions discussed         Final Clinical Impression(s) / ED Diagnoses Final diagnoses:  SOB (shortness of breath)  Palpitations  Nonspecific chest pain  Hypokalemia    Rx / DC Orders ED Discharge Orders          Ordered    potassium chloride SA (KLOR-CON M) 10 MEQ tablet  Daily        08/11/22 1230              Hayden Rasmussen, MD 08/11/22 1723

## 2022-08-11 NOTE — Discharge Instructions (Addendum)
You were seen in the emergency department for discomfort in your chest feeling your heart racing short of breath.  You had lab work which showed your potassium to be low.  You were also having a lot of PVCs on the monitor.  This improved with some hydration and some potassium.  Please continue your regular medications.  We are adding a potassium supplement.  Follow-up with your primary care doctor.  Return to the emergency department if any worsening or concerning symptoms

## 2022-08-11 NOTE — ED Notes (Signed)
Dr. Melina Copa in room to assess patient.

## 2022-08-11 NOTE — ED Triage Notes (Signed)
Patient to ED POV from home with complaints of SHOB and dizziness that started last night. States she feels like her heart is beating out of her chest. HX of A-fib. Currently on blood thinner. States the last time she felt like this is was related to A-fib. Complaining of headache at this time. States that it started 10 minutes ago to top of head. Reports excessive burping.

## 2022-08-13 DIAGNOSIS — Z0000 Encounter for general adult medical examination without abnormal findings: Secondary | ICD-10-CM | POA: Diagnosis not present

## 2022-08-13 DIAGNOSIS — C50911 Malignant neoplasm of unspecified site of right female breast: Secondary | ICD-10-CM | POA: Diagnosis not present

## 2022-08-13 DIAGNOSIS — Z299 Encounter for prophylactic measures, unspecified: Secondary | ICD-10-CM | POA: Diagnosis not present

## 2022-08-13 DIAGNOSIS — Z7189 Other specified counseling: Secondary | ICD-10-CM | POA: Diagnosis not present

## 2022-08-13 DIAGNOSIS — I10 Essential (primary) hypertension: Secondary | ICD-10-CM | POA: Diagnosis not present

## 2022-08-13 DIAGNOSIS — Z1339 Encounter for screening examination for other mental health and behavioral disorders: Secondary | ICD-10-CM | POA: Diagnosis not present

## 2022-08-13 DIAGNOSIS — I480 Paroxysmal atrial fibrillation: Secondary | ICD-10-CM | POA: Diagnosis not present

## 2022-08-13 DIAGNOSIS — E7800 Pure hypercholesterolemia, unspecified: Secondary | ICD-10-CM | POA: Diagnosis not present

## 2022-08-13 DIAGNOSIS — Z1331 Encounter for screening for depression: Secondary | ICD-10-CM | POA: Diagnosis not present

## 2022-09-05 DIAGNOSIS — R5383 Other fatigue: Secondary | ICD-10-CM | POA: Diagnosis not present

## 2022-09-05 DIAGNOSIS — Z299 Encounter for prophylactic measures, unspecified: Secondary | ICD-10-CM | POA: Diagnosis not present

## 2022-09-05 DIAGNOSIS — J029 Acute pharyngitis, unspecified: Secondary | ICD-10-CM | POA: Diagnosis not present

## 2022-09-14 DIAGNOSIS — R5383 Other fatigue: Secondary | ICD-10-CM | POA: Diagnosis not present

## 2022-09-14 DIAGNOSIS — E559 Vitamin D deficiency, unspecified: Secondary | ICD-10-CM | POA: Diagnosis not present

## 2022-09-14 DIAGNOSIS — E7800 Pure hypercholesterolemia, unspecified: Secondary | ICD-10-CM | POA: Diagnosis not present

## 2022-09-14 DIAGNOSIS — Z79899 Other long term (current) drug therapy: Secondary | ICD-10-CM | POA: Diagnosis not present

## 2022-09-17 DIAGNOSIS — E2839 Other primary ovarian failure: Secondary | ICD-10-CM | POA: Diagnosis not present

## 2022-09-18 DIAGNOSIS — Z299 Encounter for prophylactic measures, unspecified: Secondary | ICD-10-CM | POA: Diagnosis not present

## 2022-09-18 DIAGNOSIS — I10 Essential (primary) hypertension: Secondary | ICD-10-CM | POA: Diagnosis not present

## 2022-09-18 DIAGNOSIS — M8580 Other specified disorders of bone density and structure, unspecified site: Secondary | ICD-10-CM | POA: Diagnosis not present

## 2022-09-19 ENCOUNTER — Other Ambulatory Visit: Payer: Self-pay | Admitting: Hematology

## 2022-09-19 ENCOUNTER — Encounter: Payer: Self-pay | Admitting: Hematology

## 2022-09-19 DIAGNOSIS — M8580 Other specified disorders of bone density and structure, unspecified site: Secondary | ICD-10-CM | POA: Insufficient documentation

## 2022-09-20 ENCOUNTER — Inpatient Hospital Stay: Payer: Medicare Other | Attending: Hematology

## 2022-09-20 VITALS — BP 146/62 | HR 65 | Temp 98.3°F | Resp 20

## 2022-09-20 DIAGNOSIS — C50411 Malignant neoplasm of upper-outer quadrant of right female breast: Secondary | ICD-10-CM

## 2022-09-20 DIAGNOSIS — M8580 Other specified disorders of bone density and structure, unspecified site: Secondary | ICD-10-CM | POA: Diagnosis not present

## 2022-09-20 MED ORDER — DENOSUMAB 60 MG/ML ~~LOC~~ SOSY
60.0000 mg | PREFILLED_SYRINGE | Freq: Once | SUBCUTANEOUS | Status: AC
  Administered 2022-09-20: 60 mg via SUBCUTANEOUS
  Filled 2022-09-20: qty 1

## 2022-09-20 NOTE — Patient Instructions (Signed)
[facility]  Discharge Instructions: Thank you for choosing [facility] to provide your oncology and hematology care.  If you have a lab appointment with the Cancer Center, please come in thru the Main Entrance and check in at the main information desk.  Wear comfortable clothing and clothing appropriate for easy access to any Portacath or PICC line.   We strive to give you quality time with your provider. You may need to reschedule your appointment if you arrive late (15 or more minutes).  Arriving late affects you and other patients whose appointments are after yours.  Also, if you miss three or more appointments without notifying the office, you may be dismissed from the clinic at the provider's discretion.      For prescription refill requests, have your pharmacy contact our office and allow 72 hours for refills to be completed.    Today you received the following Prolia, return as scheduled.   To help prevent nausea and vomiting after your treatment, we encourage you to take your nausea medication as directed.  BELOW ARE SYMPTOMS THAT SHOULD BE REPORTED IMMEDIATELY: *FEVER GREATER THAN 100.4 F (38 C) OR HIGHER *CHILLS OR SWEATING *NAUSEA AND VOMITING THAT IS NOT CONTROLLED WITH YOUR NAUSEA MEDICATION *UNUSUAL SHORTNESS OF BREATH *UNUSUAL BRUISING OR BLEEDING *URINARY PROBLEMS (pain or burning when urinating, or frequent urination) *BOWEL PROBLEMS (unusual diarrhea, constipation, pain near the anus) TENDERNESS IN MOUTH AND THROAT WITH OR WITHOUT PRESENCE OF ULCERS (sore throat, sores in mouth, or a toothache) UNUSUAL RASH, SWELLING OR PAIN  UNUSUAL VAGINAL DISCHARGE OR ITCHING   Items with * indicate a potential emergency and should be followed up as soon as possible or go to the Emergency Department if any problems should occur.  Please show the CHEMOTHERAPY ALERT CARD or IMMUNOTHERAPY ALERT CARD at check-in to the Emergency Department and triage  nurse.  Should you have questions after your visit or need to cancel or reschedule your appointment, please contact [facility] [phone]  and follow the prompts.  Office hours are 8:00 a.m. to 4:30 p.m. Monday - Friday. Please note that voicemails left after 4:00 p.m. may not be returned until the following business day.  We are closed weekends and major holidays. You have access to a nurse at all times for urgent questions. Please call the main number to the clinic [phone] and follow the prompts.  For any non-urgent questions, you may also contact your provider using MyChart. We now offer e-Visits for anyone 18 and older to request care online for non-urgent symptoms. For details visit [email or web address].   Also download the MyChart app! Go to the app store, search "MyChart", open the app, select [facility], and log in with your MyChart username and password.   

## 2022-09-20 NOTE — Progress Notes (Signed)
Calcium from PCP 10.2. Patient taking calcium as directed. Denied tooth, jaw, and leg pain. No recent or upcoming dental visits. Labs reviewed. Patient tolerated injection with no complaints voiced. See MAR for details. Patient stable during and after injection. Site clean and dry with no bruising or swelling noted. Band aid applied. Vss with discharge and left in satisfactory condition with no s/s of distress.

## 2022-09-24 ENCOUNTER — Other Ambulatory Visit: Payer: Self-pay | Admitting: Cardiology

## 2022-09-24 NOTE — Telephone Encounter (Signed)
Prescription refill request for Eliquis received. Indication: PAF Last office visit: 04/13/22  Zandra Abts MD Scr: 0.92 on 08/11/22  Epic Age: 77 Weight: 97.2kg  Based on above findings Eliquis 5mg  twice daily is the appropriate dose.  Refill approved.

## 2022-10-11 ENCOUNTER — Ambulatory Visit: Payer: Medicare Other | Attending: Cardiology | Admitting: Cardiology

## 2022-10-11 ENCOUNTER — Encounter: Payer: Self-pay | Admitting: Cardiology

## 2022-10-11 VITALS — BP 114/60 | HR 66 | Ht 65.0 in | Wt 218.6 lb

## 2022-10-11 DIAGNOSIS — I10 Essential (primary) hypertension: Secondary | ICD-10-CM | POA: Diagnosis not present

## 2022-10-11 DIAGNOSIS — I480 Paroxysmal atrial fibrillation: Secondary | ICD-10-CM | POA: Insufficient documentation

## 2022-10-11 NOTE — Progress Notes (Signed)
Clinical Summary Marissa [name] is a 77 y.o.female seen today for follow up of the following medical problems.      1.PAF -new daignosis during  ER visit 10/09/21 with chest pain and palpitations   - no recent palpitations - compliant with meds - no bleeding on eliquis.     ER visit 08/2022 with tachycardia, SOB. Some chest discomfort with belching - EKG sinus tach 102, Trop neg. K was 3.1, was repleted.  - no recent palpitations since ER visit - compliant with meds - no bleeding on eliquis     2. HTN - compilnant with meds     3.History of chest pain - 2017 nuclear stress without ischemia -01/2022 nuclear stress: no ischemia   -does water aerobics 3 times a week without symptoms - no recent symptoms    4. History of breast cancer    Grandaughter graduating from [employer] this year, going to [facility] for speech therapy   Past Medical History:  Diagnosis Date   Anxiety    Atrial fibrillation ([place])    Cancer ([place]) 05/2017   right breast cancer   Family history of breast cancer    Family history of ovarian cancer    Family history of stomach cancer    GERD (gastroesophageal reflux disease)    High cholesterol    History of radiation therapy 07/03/17- 07/31/2017   Right Breast/ 40/05 Gy in 15 fractions, Right Breast boost/ 10 Gy in 5 fractions.    Hypertension    Personal history of radiation therapy 2018/2019     Allergies  Allergen Reactions   Nitrostat [Nitroglycerin] Shortness Of Breath     Current Outpatient Medications  Medication Sig Dispense Refill   amLODipine (NORVASC) 10 MG tablet Take 10 mg by mouth daily.     anastrozole (ARIMIDEX) 1 MG tablet Take 1 tablet (1 mg total) by mouth daily. 90 tablet 3   apixaban (ELIQUIS) 5 MG TABS tablet TAKE 1 TABLET TWICE A DAY 180 tablet 1   Cholecalciferol (VITAMIN D) 125 MCG (5000 UT) CAPS Take 5,000 Units by mouth daily.     Coenzyme Q10 200 MG capsule Take 200 mg by mouth daily.     diclofenac sodium  (VOLTAREN) 1 % GEL Apply 2 g topically 4 (four) times daily as needed. 100 g 0   ezetimibe (ZETIA) 10 MG tablet Take 10 mg by mouth daily.     Krill Oil 500 MG CAPS Take 500 mg by mouth daily.     losartan-hydrochlorothiazide (HYZAAR) 100-25 MG per tablet Take 1 tablet by mouth every morning.      metoprolol succinate (TOPROL XL) 25 MG 24 hr tablet Take 1.5 tablets (37.5 mg total) by mouth daily. 135 tablet 3   omeprazole (PRILOSEC) 20 MG capsule Take 20 mg by mouth daily.     potassium chloride SA (KLOR-CON M) 10 MEQ tablet Take 1 tablet (10 mEq total) by mouth daily. 20 tablet 0   No current facility-administered medications for this visit.     Past Surgical History:  Procedure Laterality Date   BREAST BIOPSY Right 05/09/2017   x2   BREAST LUMPECTOMY Right 05/20/2017   CARDIAC CATHETERIZATION     2008- negative findings   CATARACT EXTRACTION W/PHACO Right 09/06/2014   Procedure: CATARACT EXTRACTION PHACO AND INTRAOCULAR LENS PLACEMENT; CDE:  3.85;  Surgeon: Williams Che, MD;  Location: [facility];  Service: Ophthalmology;  Laterality: Right;   CATARACT EXTRACTION W/PHACO Left 12/13/2014  Procedure: CATARACT EXTRACTION PHACO AND INTRAOCULAR LENS PLACEMENT LEFT EYE CDE=10.79;  Surgeon: Williams Che, MD;  Location: [facility];  Service: Ophthalmology;  Laterality: Left;   COLONOSCOPY WITH PROPOFOL N/A 10/28/2020   Procedure: COLONOSCOPY WITH PROPOFOL;  Surgeon: Harvel Quale, MD;  Location: [facility];  Service: Gastroenterology;  Laterality: N/A;  AM   ESOPHAGOGASTRODUODENOSCOPY N/A 05/05/2014   Procedure: ESOPHAGOGASTRODUODENOSCOPY (EGD);  Surgeon: Rogene Houston, MD;  Location: [facility];  Service: Endoscopy;  Laterality: N/A;  255   EUS N/A 06/10/2014   Procedure: UPPER ENDOSCOPIC ULTRASOUND (EUS) LINEAR;  Surgeon: Milus Banister, MD;  Location: [facility];  Service: Endoscopy;  Laterality: N/A;   POLYPECTOMY  10/28/2020   Procedure: POLYPECTOMY;  Surgeon: Harvel Quale, MD;  Location: [facility];  Service: Gastroenterology;;   RADIOACTIVE SEED GUIDED PARTIAL MASTECTOMY WITH AXILLARY SENTINEL LYMPH NODE BIOPSY Right 05/20/2017   Procedure: RIGHT BREAST LUMPECTOMY WITH RADIOACTIVE SEED AND SENTINEL LYMPH NODE BIOPSY;  Surgeon: Rolm Bookbinder, MD;  Location: [facility];  Service: General;  Laterality: Right;   TUBAL LIGATION       Allergies  Allergen Reactions   Nitrostat [Nitroglycerin] Shortness Of Breath      Family History  Problem Relation Age of Onset   CAD Mother 79   Leukemia Father    Parkinson's disease Father    Breast cancer Daughter 55       BRCA neg   Heart Problems Maternal Aunt    Cancer Maternal Uncle        NOS   Cancer Paternal Uncle        NOS   Diabetes Maternal Grandfather    Liver cancer Maternal Grandfather    Cancer Paternal Grandmother        NOS   Brain cancer Maternal Aunt    Lung cancer Maternal Aunt        smoker   Cancer Maternal Uncle        NOS   Stomach cancer Maternal Uncle    Lung cancer Maternal Uncle        smoker   Breast cancer Cousin        mat first cousin   Breast cancer Cousin        mat first cousin dx under 76   Breast cancer Cousin        2 additional mat first cousins   Ovarian cancer Cousin        mat first cousin   Cancer Cousin        several female mat first cousins with cancer NOS   Breast cancer Cousin        2 pat first cousins   Cancer Cousin        3 pat first cousins with cancer NOS     Social History [name] reports that she has never smoked. She has never been exposed to tobacco smoke. She has never used smokeless tobacco. [name] reports no history of alcohol use.   Review of Systems CONSTITUTIONAL: No weight loss, fever, chills, weakness or fatigue.  HEENT: Eyes: No visual loss, blurred vision, double vision or yellow sclerae.No hearing loss, sneezing, congestion, runny nose or sore throat.  SKIN: No rash or itching.   CARDIOVASCULAR: per hpi RESPIRATORY: No shortness of breath, cough or sputum.  GASTROINTESTINAL: No anorexia, nausea, vomiting or diarrhea. No abdominal pain or blood.  GENITOURINARY: No burning on urination, no polyuria NEUROLOGICAL: No headache, dizziness, syncope,  paralysis, ataxia, numbness or tingling in the extremities. No change in bowel or bladder control.  MUSCULOSKELETAL: No muscle, back pain, joint pain or stiffness.  LYMPHATICS: No enlarged nodes. No history of splenectomy.  PSYCHIATRIC: No history of depression or anxiety.  ENDOCRINOLOGIC: No reports of sweating, cold or heat intolerance. No polyuria or polydipsia.  Marland Kitchen   Physical Examination Today's Vitals   10/11/22 1012  BP: 114/60  Pulse: 66  SpO2: 97%  Weight: 218 lb 9.6 oz (99.2 kg)  Height: 5\' 5"  (1.651 m)   Body mass index is 36.38 kg/m.  Gen: resting comfortably, no acute distress HEENT: no scleral icterus, pupils equal round and reactive, no palptable cervical adenopathy,  CV: RRR, no m/rg, no jvd Resp: Clear to auscultation bilaterally GI: abdomen is soft, non-tender, non-distended, normal bowel sounds, no hepatosplenomegaly MSK: extremities are warm, no edema.  Skin: warm, no rash Neuro:  no focal deficits Psych: appropriate affect   Diagnostic Studies  06/2016 nuclear stress There was no ST segment deviation noted during stress. The study is normal. No gross ischemic territories nor evidence of scar. This is a low risk study. Nuclear stress EF: 60%.     06/2016 echo Study Conclusions   - Left ventricle: The cavity size was normal. Wall thickness was    normal. Systolic function was normal. The estimated ejection    fraction was in the range of 60% to 65%. Wall motion was normal;    there were no regional wall motion abnormalities. Doppler    parameters are consistent with abnormal left ventricular    relaxation (grade 1 diastolic dysfunction).  - Left atrium: The atrium was mildly  dilated.  - Tricuspid valve: There was mild regurgitation.    10/2021 echo IMPRESSIONS     1. Left ventricular ejection fraction, by estimation, is 60 to 65%. The  left ventricle has normal function. The left ventricle has no regional  wall motion abnormalities. Left ventricular diastolic parameters are  consistent with Grade I diastolic  dysfunction (impaired relaxation).   2. Right ventricular systolic function is normal. The right ventricular  size is normal. There is normal pulmonary artery systolic pressure.   3. The mitral valve is normal in structure. No evidence of mitral valve  regurgitation. No evidence of mitral stenosis.   4. The aortic valve is tricuspid. Aortic valve regurgitation is not  visualized. No aortic stenosis is present.   5. The inferior vena cava is normal in size with greater than 50%  respiratory variability, suggesting right atrial pressure of 3 mmHg.    01/2022 nuclear stress Normal resting and stress perfusion. No ischemia or infarction EF 69%     Assessment and Plan   1.PAF/acquired thrombophilia - no recent palpitations , continue current meds including eliquis for stroke prevention   2.HTN -bp is at goal, continue current meds        Arnoldo Lenis, M.D.

## 2022-10-11 NOTE — Patient Instructions (Signed)
Medication Instructions:  Continue all current medications.   Labwork: none  Testing/Procedures: none  Follow-Up: 6 months   Any Other Special Instructions Will Be Listed Below (If Applicable).   If you need a refill on your cardiac medications before your next appointment, please call your pharmacy.  

## 2022-12-07 ENCOUNTER — Other Ambulatory Visit: Payer: Self-pay

## 2022-12-07 DIAGNOSIS — C50411 Malignant neoplasm of upper-outer quadrant of right female breast: Secondary | ICD-10-CM

## 2022-12-07 DIAGNOSIS — E559 Vitamin D deficiency, unspecified: Secondary | ICD-10-CM

## 2022-12-10 DIAGNOSIS — E559 Vitamin D deficiency, unspecified: Secondary | ICD-10-CM | POA: Diagnosis not present

## 2022-12-10 DIAGNOSIS — Z170 Estrogen receptor positive status [ER+]: Secondary | ICD-10-CM | POA: Diagnosis not present

## 2022-12-10 DIAGNOSIS — C50411 Malignant neoplasm of upper-outer quadrant of right female breast: Secondary | ICD-10-CM | POA: Diagnosis not present

## 2022-12-17 NOTE — Progress Notes (Signed)
[facility] [address] [place] [address]    Clinic Day:  12/18/2022  Referring physician: Kirstie Peri, MD  Patient Care Team: Kirstie Peri, MD as PCP - General (Internal Medicine) Wyline Mood Dorothe Pea, MD as PCP - Cardiology (Cardiology)   ASSESSMENT & PLAN:   Assessment: 1.  Right breast infiltrating ductal carcinoma, ER/PR positive HER-2 negative: -Lumpectomy on 06/11/2017, 1.4 cm, 0/2 sentinel lymph nodes positive, margins negative, ER/PR positive, HER-2 negative, Ki-67 5%. -Oncotype DX recurrence score of 10.  Negative genetic testing for hereditary breast cancer. -Radiation therapy finished in January 2019.  Anastrozole started on 09/06/2017. -Mammogram from 05/16/2020 was BI-RADS Category 2.   2.  Osteopenia: -DEXA scan at Dr. Margaretmary Eddy office on 08/07/2018 shows T score -1.4. - DEXA scan (08/19/2020): T-score -2.1. - DEXA scan (09/17/2022): T-score -2.4.   3.  Right ovarian cyst and left renal lesion: -Recent pelvic ultrasound on 04/17/2019 shows right ovary entirely occupied by a simple cyst measuring 3.3 x 2.4 x 1.7 cm.  This has benign characteristics.  CA-125 done by Dr. Sherryll Burger on 05/13/2019 was 6.1. -Ultrasound of the abdomen on 04/17/2019 shows multiple parapelvic renal cysts bilaterally with cortical thinning and increased echogenicity.  Upper pole left renal cyst similar to that visualized on prior CT scan.  Fatty infiltration of the liver present.    Plan: 1.  Right breast infiltrating ductal carcinoma, ER/PR positive HER-2 negative: - Physical exam: Lumpectomy scar in the upper outer quadrant of the right breast is stable.  No palpable masses in bilateral breasts and adenopathy. - Mammogram on 05/21/2022: BI-RADS Category 1. - I have reviewed her labs which were within normal limits. - She is tolerating anastrozole very well.  Continue anastrozole for a total of 10 years. - I will switch her follow-up to 1 year.  We will arrange for mammogram again in  November.   2.  Osteopenia: - DEXA scan on 09/17/2022 showed slight deterioration with T-score of -2.4. - Prolia started on 09/20/2022.  She tolerated it well.  She will continue with every 6 months. - Continue vitamin D once weekly.  Latest vitamin D level is 31.7.  Will recheck in 1 year.    Orders Placed This Encounter  Procedures   CBC with Differential    Standing Status:   Future    Standing Expiration Date:   12/18/2023   Comprehensive metabolic panel    Standing Status:   Future    Standing Expiration Date:   12/18/2023   VITAMIN D 25 Hydroxy (Vit-D Deficiency, Fractures)    Standing Status:   Future    Standing Expiration Date:   12/18/2023      I,Katie Daubenspeck,acting as a scribe for Doreatha Massed, MD.,have documented all relevant documentation on the behalf of Doreatha Massed, MD,as directed by  Doreatha Massed, MD while in the presence of Doreatha Massed, MD.   I, Doreatha Massed MD, have reviewed the above documentation for accuracy and completeness, and I agree with the above.   Doreatha Massed, MD   6/11/20245:59 PM  CHIEF COMPLAINT:   Diagnosis: right breast cancer    Cancer Staging  Carcinoma of upper-outer quadrant of right breast in female, estrogen receptor positive (HCC) Staging form: Breast, AJCC 8th Edition - Clinical: cT1c, cN0 - Unsigned - Pathologic: Stage IA (pT1c, pN0, cM0, G1, ER+, PR+, HER2-, Oncotype DX score: 11) - Signed by Ralene Cork, MD on 06/13/2017    Prior Therapy: 1. Right lumpectomy, 06/11/17 2. XRT  to right breast, completed 07/2017  Current Therapy:  anastrozole    HISTORY OF PRESENT ILLNESS:   Oncology History  Carcinoma of upper-outer quadrant of right breast in female, estrogen receptor positive (HCC)  06/11/2017 Initial Diagnosis   Carcinoma of upper-outer quadrant of right breast in female, estrogen receptor positive (HCC)   07/08/2017 Genetic Testing   Negative genetic testing on the  Multi-cancer panel.  The Multi-Gene Panel offered by [facility] includes sequencing and/or deletion duplication testing of the following 83 genes: ALK, APC, ATM, AXIN2,BAP1,  BARD1, BLM, BMPR1A, BRCA1, BRCA2, BRIP1, CASR, CDC73, CDH1, CDK4, CDKN1B, CDKN1C, CDKN2A (p14ARF), CDKN2A (p16INK4a), CEBPA, CHEK2, CTNNA1, DICER1, DIS3L2, EGFR (c.2369C>T, p.Thr790Met variant only), EPCAM (Deletion/duplication testing only), FH, FLCN, GATA2, GPC3, GREM1 (Promoter region deletion/duplication testing only), HOXB13 (c.251G>A, p.Gly84Glu), HRAS, KIT, MAX, MEN1, MET, MITF (c.952G>A, p.Glu318Lys variant only), MLH1, MSH2, MSH3, MSH6, MUTYH, NBN, NF1, NF2, NTHL1, PALB2, PDGFRA, PHOX2B, PMS2, POLD1, POLE, POT1, PRKAR1A, PTCH1, PTEN, RAD50, RAD51C, RAD51D, RB1, RECQL4, RET, RUNX1, SDHAF2, SDHA (sequence changes only), SDHB, SDHC, SDHD, SMAD4, SMARCA4, SMARCB1, SMARCE1, STK11, SUFU, TERT, TERT, TMEM127, TP53, TSC1, TSC2, VHL, WRN and WT1.  The report date is July 08, 2017.       INTERVAL HISTORY:   Marissa [name] is a 77 y.o. female presenting to clinic today for follow up of right breast cancer. She was last seen by me on 06/13/22.  Since her last visit, she underwent repeat DEXA scan on 09/17/22 showing a T-score of -2.4, which is considered borderline osteoporotic. This is stable from 12/2021 but worse since 08/2020.  Today, she states that she is doing well overall. Her appetite level is at 100%. Her energy level is at 80%.  PAST MEDICAL HISTORY:   Past Medical History: Past Medical History:  Diagnosis Date   Anxiety    Atrial fibrillation (HCC)    Cancer (HCC) 05/2017   right breast cancer   Family history of breast cancer    Family history of ovarian cancer    Family history of stomach cancer    GERD (gastroesophageal reflux disease)    High cholesterol    History of radiation therapy 07/03/17- 07/31/2017   Right Breast/ 40/05 Gy in 15 fractions, Right Breast boost/ 10 Gy in 5 fractions.    Hypertension     Personal history of radiation therapy 2018/2019    Surgical History: Past Surgical History:  Procedure Laterality Date   BREAST BIOPSY Right 05/09/2017   x2   BREAST LUMPECTOMY Right 05/20/2017   CARDIAC CATHETERIZATION     2008- negative findings   CATARACT EXTRACTION W/PHACO Right 09/06/2014   Procedure: CATARACT EXTRACTION PHACO AND INTRAOCULAR LENS PLACEMENT; CDE:  3.85;  Surgeon: Susa Simmonds, MD;  Location: [facility];  Service: Ophthalmology;  Laterality: Right;   CATARACT EXTRACTION W/PHACO Left 12/13/2014   Procedure: CATARACT EXTRACTION PHACO AND INTRAOCULAR LENS PLACEMENT LEFT EYE CDE=10.79;  Surgeon: Susa Simmonds, MD;  Location: [facility];  Service: Ophthalmology;  Laterality: Left;   COLONOSCOPY WITH PROPOFOL N/A 10/28/2020   Procedure: COLONOSCOPY WITH PROPOFOL;  Surgeon: Dolores Frame, MD;  Location: [facility];  Service: Gastroenterology;  Laterality: N/A;  AM   ESOPHAGOGASTRODUODENOSCOPY N/A 05/05/2014   Procedure: ESOPHAGOGASTRODUODENOSCOPY (EGD);  Surgeon: Malissa Hippo, MD;  Location: [facility];  Service: Endoscopy;  Laterality: N/A;  255   EUS N/A 06/10/2014   Procedure: UPPER ENDOSCOPIC ULTRASOUND (EUS) LINEAR;  Surgeon: Rachael Fee, MD;  Location: [facility];  Service: Endoscopy;  Laterality:  N/A;   POLYPECTOMY  10/28/2020   Procedure: POLYPECTOMY;  Surgeon: Marguerita Merles, Reuel Boom, MD;  Location: [facility];  Service: Gastroenterology;;   RADIOACTIVE SEED GUIDED PARTIAL MASTECTOMY WITH AXILLARY SENTINEL LYMPH NODE BIOPSY Right 05/20/2017   Procedure: RIGHT BREAST LUMPECTOMY WITH RADIOACTIVE SEED AND SENTINEL LYMPH NODE BIOPSY;  Surgeon: Emelia Loron, MD;  Location: [facility];  Service: General;  Laterality: Right;   TUBAL LIGATION      Social History: Social History   Socioeconomic History   Marital status: Married    Spouse name: Not on file   Number of children: 2   Years of education: Not on file   Highest  education level: Not on file  Occupational History   Occupation: retired  Tobacco Use   Smoking status: Never    Passive exposure: Never   Smokeless tobacco: Never  Vaping Use   Vaping Use: Never used  Substance and Sexual Activity   Alcohol use: No   Drug use: No   Sexual activity: Yes    Birth control/protection: Post-menopausal  Other Topics Concern   Not on file  Social History Narrative   Not on file   Social Determinants of Health   Financial Resource Strain: Low Risk  (05/19/2020)   Overall Financial Resource Strain (CARDIA)    Difficulty of Paying Living Expenses: Not hard at all  Food Insecurity: No Food Insecurity (05/19/2020)   Hunger Vital Sign    Worried About Running Out of Food in the Last Year: Never true    Ran Out of Food in the Last Year: Never true  Transportation Needs: No Transportation Needs (05/19/2020)   PRAPARE - Administrator, Civil Service (Medical): No    Lack of Transportation (Non-Medical): No  Physical Activity: Inactive (05/19/2020)   Exercise Vital Sign    Days of Exercise per Week: 0 days    Minutes of Exercise per Session: 0 min  Stress: No Stress Concern Present (05/19/2020)   [employer] of Occupational Health - Occupational Stress Questionnaire    Feeling of Stress : Not at all  Social Connections: Moderately Integrated (05/19/2020)   Social Connection and Isolation Panel [NHANES]    Frequency of Communication with Friends and Family: More than three times a week    Frequency of Social Gatherings with Friends and Family: More than three times a week    Attends Religious Services: More than 4 times per year    Active Member of [employer] or Organizations: No    Attends Banker Meetings: Never    Marital Status: Married  Catering manager Violence: Not At Risk (05/19/2020)   Humiliation, Afraid, Rape, and Kick questionnaire    Fear of Current or Ex-Partner: No    Emotionally Abused: No    Physically  Abused: No    Sexually Abused: No    Family History: Family History  Problem Relation Age of Onset   CAD Mother 45   Leukemia Father    Parkinson's disease Father    Breast cancer Daughter 34       BRCA neg   Heart Problems Maternal Aunt    Cancer Maternal Uncle        NOS   Cancer Paternal Uncle        NOS   Diabetes Maternal Grandfather    Liver cancer Maternal Grandfather    Cancer Paternal Grandmother        NOS   Brain cancer Maternal Aunt  Lung cancer Maternal Aunt        smoker   Cancer Maternal Uncle        NOS   Stomach cancer Maternal Uncle    Lung cancer Maternal Uncle        smoker   Breast cancer Cousin        mat first cousin   Breast cancer Cousin        mat first cousin dx under 39   Breast cancer Cousin        2 additional mat first cousins   Ovarian cancer Cousin        mat first cousin   Cancer Cousin        several female mat first cousins with cancer NOS   Breast cancer Cousin        2 pat first cousins   Cancer Cousin        3 pat first cousins with cancer NOS    Current Medications:  Current Outpatient Medications:    amLODipine (NORVASC) 10 MG tablet, Take 10 mg by mouth daily., Disp: , Rfl:    apixaban (ELIQUIS) 5 MG TABS tablet, TAKE 1 TABLET TWICE A DAY, Disp: 180 tablet, Rfl: 1   Cholecalciferol (VITAMIN D) 125 MCG (5000 UT) CAPS, Take 5,000 Units by mouth once a week., Disp: , Rfl:    Coenzyme Q10 200 MG capsule, Take 200 mg by mouth daily., Disp: , Rfl:    denosumab (PROLIA) 60 MG/ML SOSY injection, Inject 60 mg into the skin every 6 (six) months., Disp: , Rfl:    diclofenac sodium (VOLTAREN) 1 % GEL, Apply 2 g topically 4 (four) times daily as needed., Disp: 100 g, Rfl: 0   ezetimibe (ZETIA) 10 MG tablet, Take 10 mg by mouth daily., Disp: , Rfl:    Krill Oil 500 MG CAPS, Take 500 mg by mouth daily., Disp: , Rfl:    metoprolol succinate (TOPROL XL) 25 MG 24 hr tablet, Take 1.5 tablets (37.5 mg total) by mouth daily., Disp: 135  tablet, Rfl: 3   omeprazole (PRILOSEC) 20 MG capsule, Take 20 mg by mouth daily., Disp: , Rfl:    anastrozole (ARIMIDEX) 1 MG tablet, Take 1 tablet (1 mg total) by mouth daily., Disp: 90 tablet, Rfl: 3   Allergies: Allergies  Allergen Reactions   Nitrostat [Nitroglycerin] Shortness Of Breath    REVIEW OF SYSTEMS:   Review of Systems  Constitutional:  Negative for chills, fatigue and fever.  HENT:   Negative for lump/mass, mouth sores, nosebleeds, sore throat and trouble swallowing.   Eyes:  Negative for eye problems.  Respiratory:  Positive for shortness of breath. Negative for cough.   Cardiovascular:  Negative for chest pain, leg swelling and palpitations.  Gastrointestinal:  Negative for abdominal pain, constipation, diarrhea, nausea and vomiting.  Genitourinary:  Negative for bladder incontinence, difficulty urinating, dysuria, frequency, hematuria and nocturia.   Musculoskeletal:  Positive for back pain. Negative for arthralgias, flank pain, myalgias and neck pain.  Skin:  Negative for itching and rash.  Neurological:  Negative for dizziness, headaches and numbness.  Hematological:  Does not bruise/bleed easily.  Psychiatric/Behavioral:  Negative for depression, sleep disturbance and suicidal ideas. The patient is not nervous/anxious.   All other systems reviewed and are negative.    VITALS:   Blood pressure (!) 149/68, pulse 64, temperature 98.3 F (36.8 C), temperature source Oral, resp. rate 18, height 5\' 5"  (1.651 m), weight 212 lb 11.2 oz (96.5 kg), SpO2  98 %.  Wt Readings from Last 3 Encounters:  12/18/22 212 lb 11.2 oz (96.5 kg)  10/11/22 218 lb 9.6 oz (99.2 kg)  08/11/22 215 lb (97.5 kg)    Body mass index is 35.4 kg/m.  Performance status (ECOG): 1 - Symptomatic but completely ambulatory  PHYSICAL EXAM:   Physical Exam Vitals and nursing note reviewed. Exam conducted with a chaperone present.  Constitutional:      Appearance: Normal appearance.   Cardiovascular:     Rate and Rhythm: Normal rate and regular rhythm.     Pulses: Normal pulses.     Heart sounds: Normal heart sounds.  Pulmonary:     Effort: Pulmonary effort is normal.     Breath sounds: Normal breath sounds.  Abdominal:     Palpations: Abdomen is soft. There is no hepatomegaly, splenomegaly or mass.     Tenderness: There is no abdominal tenderness.  Musculoskeletal:     Right lower leg: No edema.     Left lower leg: No edema.  Lymphadenopathy:     Cervical: No cervical adenopathy.     Right cervical: No superficial, deep or posterior cervical adenopathy.    Left cervical: No superficial, deep or posterior cervical adenopathy.     Upper Body:     Right upper body: No supraclavicular or axillary adenopathy.     Left upper body: No supraclavicular or axillary adenopathy.  Neurological:     General: No focal deficit present.     Mental Status: She is alert and oriented to person, place, and time.  Psychiatric:        Mood and Affect: Mood normal.        Behavior: Behavior normal.     LABS:      Latest Ref Rng & Units 08/11/2022    8:50 AM 12/31/2021    4:49 AM 10/09/2021    8:21 AM  CBC  WBC 4.0 - 10.5 K/uL 6.2  6.3  9.0   Hemoglobin 12.0 - 15.0 g/dL 16.1  09.6  04.5   Hematocrit 36.0 - 46.0 % 46.6  42.9  49.1   Platelets 150 - 400 K/uL 248  247  294       Latest Ref Rng & Units 08/11/2022    8:50 AM 02/02/2022    8:03 AM 01/10/2022    9:29 AM  CMP  Glucose 70 - 99 mg/dL 409  96  89   BUN 8 - 23 mg/dL 23  20  21    Creatinine 0.44 - 1.00 mg/dL 8.11  9.14  7.82   Sodium 135 - 145 mmol/L 136  138  137   Potassium 3.5 - 5.1 mmol/L 3.1  4.1  4.4   Chloride 98 - 111 mmol/L 102  100  101   CO2 22 - 32 mmol/L 20  22  24    Calcium 8.9 - 10.3 mg/dL 95.6  21.3  08.6   Total Protein 6.5 - 8.1 g/dL 7.9     Total Bilirubin 0.3 - 1.2 mg/dL 1.1     Alkaline Phos 38 - 126 U/L 103     AST 15 - 41 U/L 20     ALT 0 - 44 U/L 17        No results found for: "CEA1",  "CEA" / No results found for: "CEA1", "CEA" No results found for: "PSA1" No results found for: "VHQ469" No results found for: "CAN125"  No results found for: "TOTALPROTELP", "ALBUMINELP", "A1GS", "A2GS", "BETS", "BETA2SER", "GAMS", "MSPIKE", "  SPEI" No results found for: "TIBC", "FERRITIN", "IRONPCTSAT" No results found for: "LDH"   STUDIES:   No results found.

## 2022-12-18 ENCOUNTER — Inpatient Hospital Stay: Payer: Medicare Other | Attending: Hematology | Admitting: Hematology

## 2022-12-18 VITALS — BP 149/68 | HR 64 | Temp 98.3°F | Resp 18 | Ht 65.0 in | Wt 212.7 lb

## 2022-12-18 DIAGNOSIS — C50411 Malignant neoplasm of upper-outer quadrant of right female breast: Secondary | ICD-10-CM | POA: Insufficient documentation

## 2022-12-18 DIAGNOSIS — E559 Vitamin D deficiency, unspecified: Secondary | ICD-10-CM | POA: Diagnosis not present

## 2022-12-18 DIAGNOSIS — M8580 Other specified disorders of bone density and structure, unspecified site: Secondary | ICD-10-CM | POA: Insufficient documentation

## 2022-12-18 DIAGNOSIS — N83201 Unspecified ovarian cyst, right side: Secondary | ICD-10-CM | POA: Diagnosis not present

## 2022-12-18 DIAGNOSIS — Z170 Estrogen receptor positive status [ER+]: Secondary | ICD-10-CM | POA: Insufficient documentation

## 2022-12-18 DIAGNOSIS — Z79811 Long term (current) use of aromatase inhibitors: Secondary | ICD-10-CM | POA: Insufficient documentation

## 2022-12-18 MED ORDER — ANASTROZOLE 1 MG PO TABS
1.0000 mg | ORAL_TABLET | Freq: Every day | ORAL | 3 refills | Status: DC
Start: 2022-12-18 — End: 2023-07-16

## 2022-12-18 NOTE — Patient Instructions (Addendum)
[facility] at [facility] Discharge Instructions   You were seen and examined today by Dr. Ellin Saba.  He reviewed the results of your lab work which are normal/stable.   Continue anastrozole as prescribed.   Return as scheduled.    Thank you for choosing [facility] at [facility] to provide your oncology and hematology care.  To afford each patient quality time with our provider, please arrive at least 15 minutes before your scheduled appointment time.   If you have a lab appointment with the Cancer Center please come in thru the Main Entrance and check in at the main information desk.  You need to re-schedule your appointment should you arrive 10 or more minutes late.  We strive to give you quality time with our providers, and arriving late affects you and other patients whose appointments are after yours.  Also, if you no show three or more times for appointments you may be dismissed from the clinic at the providers discretion.     Again, thank you for choosing [facility].  Our hope is that these requests will decrease the amount of time that you wait before being seen by our physicians.       _____________________________________________________________  Should you have questions after your visit to [facility], please contact our office at [phone] and follow the prompts.  Our office hours are 8:00 a.m. and 4:30 p.m. Monday - Friday.  Please note that voicemails left after 4:00 p.m. may not be returned until the following business day.  We are closed weekends and major holidays.  You do have access to a nurse 24-7, just call the main number to the clinic [phone] and do not press any options, hold on the line and a nurse will answer the phone.    For prescription refill requests, have your pharmacy contact our office and allow 72 hours.    Due to Covid, you will need to wear a mask upon entering the  hospital. If you do not have a mask, a mask will be given to you at the Main Entrance upon arrival. For doctor visits, patients may have 1 support person age 20 or older with them. For treatment visits, patients can not have anyone with them due to social distancing guidelines and our immunocompromised population.

## 2022-12-19 DIAGNOSIS — Z299 Encounter for prophylactic measures, unspecified: Secondary | ICD-10-CM | POA: Diagnosis not present

## 2022-12-19 DIAGNOSIS — E876 Hypokalemia: Secondary | ICD-10-CM | POA: Diagnosis not present

## 2022-12-19 DIAGNOSIS — I10 Essential (primary) hypertension: Secondary | ICD-10-CM | POA: Diagnosis not present

## 2022-12-19 DIAGNOSIS — M8580 Other specified disorders of bone density and structure, unspecified site: Secondary | ICD-10-CM | POA: Diagnosis not present

## 2022-12-31 ENCOUNTER — Encounter (HOSPITAL_COMMUNITY): Payer: Self-pay | Admitting: Internal Medicine

## 2022-12-31 ENCOUNTER — Other Ambulatory Visit (HOSPITAL_COMMUNITY): Payer: Self-pay | Admitting: Internal Medicine

## 2022-12-31 ENCOUNTER — Ambulatory Visit (HOSPITAL_COMMUNITY)
Admission: RE | Admit: 2022-12-31 | Discharge: 2022-12-31 | Disposition: A | Discharge status: Home / Self Care | Payer: Medicare Other | Source: Ambulatory Visit | Attending: Internal Medicine | Admitting: Internal Medicine

## 2022-12-31 DIAGNOSIS — Z299 Encounter for prophylactic measures, unspecified: Secondary | ICD-10-CM | POA: Diagnosis not present

## 2022-12-31 DIAGNOSIS — M533 Sacrococcygeal disorders, not elsewhere classified: Secondary | ICD-10-CM

## 2022-12-31 DIAGNOSIS — I10 Essential (primary) hypertension: Secondary | ICD-10-CM | POA: Diagnosis not present

## 2022-12-31 DIAGNOSIS — M25559 Pain in unspecified hip: Secondary | ICD-10-CM | POA: Diagnosis not present

## 2022-12-31 DIAGNOSIS — M47817 Spondylosis without myelopathy or radiculopathy, lumbosacral region: Secondary | ICD-10-CM | POA: Diagnosis not present

## 2022-12-31 DIAGNOSIS — M5136 Other intervertebral disc degeneration, lumbar region: Secondary | ICD-10-CM | POA: Diagnosis not present

## 2022-12-31 DIAGNOSIS — M545 Low back pain, unspecified: Secondary | ICD-10-CM | POA: Diagnosis not present

## 2023-01-28 DIAGNOSIS — Z299 Encounter for prophylactic measures, unspecified: Secondary | ICD-10-CM | POA: Diagnosis not present

## 2023-01-28 DIAGNOSIS — I10 Essential (primary) hypertension: Secondary | ICD-10-CM | POA: Diagnosis not present

## 2023-01-28 DIAGNOSIS — M533 Sacrococcygeal disorders, not elsewhere classified: Secondary | ICD-10-CM | POA: Diagnosis not present

## 2023-02-15 ENCOUNTER — Other Ambulatory Visit: Payer: Self-pay

## 2023-02-15 ENCOUNTER — Emergency Department (HOSPITAL_COMMUNITY)
Admission: EM | Admit: 2023-02-15 | Discharge: 2023-02-15 | Disposition: A | Discharge status: Home / Self Care | Payer: Medicare Other | Source: Home / Self Care | Attending: Student | Admitting: Student

## 2023-02-15 ENCOUNTER — Emergency Department (HOSPITAL_COMMUNITY): Payer: Medicare Other

## 2023-02-15 DIAGNOSIS — I6782 Cerebral ischemia: Secondary | ICD-10-CM | POA: Diagnosis not present

## 2023-02-15 DIAGNOSIS — Z20822 Contact with and (suspected) exposure to covid-19: Secondary | ICD-10-CM | POA: Diagnosis not present

## 2023-02-15 DIAGNOSIS — Z853 Personal history of malignant neoplasm of breast: Secondary | ICD-10-CM | POA: Insufficient documentation

## 2023-02-15 DIAGNOSIS — I2510 Atherosclerotic heart disease of native coronary artery without angina pectoris: Secondary | ICD-10-CM | POA: Diagnosis not present

## 2023-02-15 DIAGNOSIS — R519 Headache, unspecified: Secondary | ICD-10-CM | POA: Diagnosis not present

## 2023-02-15 DIAGNOSIS — R55 Syncope and collapse: Secondary | ICD-10-CM | POA: Insufficient documentation

## 2023-02-15 DIAGNOSIS — R0600 Dyspnea, unspecified: Secondary | ICD-10-CM | POA: Insufficient documentation

## 2023-02-15 DIAGNOSIS — N281 Cyst of kidney, acquired: Secondary | ICD-10-CM | POA: Diagnosis not present

## 2023-02-15 DIAGNOSIS — I700 Atherosclerosis of aorta: Secondary | ICD-10-CM | POA: Diagnosis not present

## 2023-02-15 DIAGNOSIS — I10 Essential (primary) hypertension: Secondary | ICD-10-CM | POA: Diagnosis not present

## 2023-02-15 DIAGNOSIS — Z7901 Long term (current) use of anticoagulants: Secondary | ICD-10-CM | POA: Insufficient documentation

## 2023-02-15 DIAGNOSIS — Z79899 Other long term (current) drug therapy: Secondary | ICD-10-CM | POA: Insufficient documentation

## 2023-02-15 DIAGNOSIS — K8020 Calculus of gallbladder without cholecystitis without obstruction: Secondary | ICD-10-CM | POA: Diagnosis not present

## 2023-02-15 DIAGNOSIS — R0602 Shortness of breath: Secondary | ICD-10-CM | POA: Diagnosis not present

## 2023-02-15 LAB — CBC WITH DIFFERENTIAL/PLATELET
Abs Immature Granulocytes: 0.02 10*3/uL (ref 0.00–0.07)
Basophils Absolute: 0 10*3/uL (ref 0.0–0.1)
Basophils Relative: 0 %
Eosinophils Absolute: 0.2 10*3/uL (ref 0.0–0.5)
Eosinophils Relative: 2 %
HCT: 44.5 % (ref 36.0–46.0)
Hemoglobin: 15.2 g/dL — ABNORMAL HIGH (ref 12.0–15.0)
Immature Granulocytes: 0 %
Lymphocytes Relative: 32 %
Lymphs Abs: 2.3 10*3/uL (ref 0.7–4.0)
MCH: 30.8 pg (ref 26.0–34.0)
MCHC: 34.2 g/dL (ref 30.0–36.0)
MCV: 90.1 fL (ref 80.0–100.0)
Monocytes Absolute: 0.5 10*3/uL (ref 0.1–1.0)
Monocytes Relative: 7 %
Neutro Abs: 4.1 10*3/uL (ref 1.7–7.7)
Neutrophils Relative %: 59 %
Platelets: 241 10*3/uL (ref 150–400)
RBC: 4.94 MIL/uL (ref 3.87–5.11)
RDW: 12.1 % (ref 11.5–15.5)
WBC: 7.1 10*3/uL (ref 4.0–10.5)
nRBC: 0 % (ref 0.0–0.2)

## 2023-02-15 LAB — COMPREHENSIVE METABOLIC PANEL
ALT: 19 U/L (ref 0–44)
AST: 21 U/L (ref 15–41)
Albumin: 4.1 g/dL (ref 3.5–5.0)
Alkaline Phosphatase: 68 U/L (ref 38–126)
Anion gap: 13 (ref 5–15)
BUN: 22 mg/dL (ref 8–23)
CO2: 19 mmol/L — ABNORMAL LOW (ref 22–32)
Calcium: 10 mg/dL (ref 8.9–10.3)
Chloride: 102 mmol/L (ref 98–111)
Creatinine, Ser: 0.82 mg/dL (ref 0.44–1.00)
GFR, Estimated: >60 mL/min (ref >60)
Glucose, Bld: 107 mg/dL — ABNORMAL HIGH (ref 70–99)
Potassium: 3.2 mmol/L — ABNORMAL LOW (ref 3.5–5.1)
Sodium: 134 mmol/L — ABNORMAL LOW (ref 135–145)
Total Bilirubin: 0.7 mg/dL (ref 0.3–1.2)
Total Protein: 7.4 g/dL (ref 6.5–8.1)

## 2023-02-15 LAB — BRAIN NATRIURETIC PEPTIDE: B Natriuretic Peptide: 69 pg/mL (ref 0.0–100.0)

## 2023-02-15 LAB — D-DIMER, QUANTITATIVE: D-Dimer, Quant: 0.88 ug/mL-FEU — ABNORMAL HIGH (ref 0.00–0.50)

## 2023-02-15 LAB — SARS CORONAVIRUS 2 BY RT PCR: SARS Coronavirus 2 by RT PCR: NEGATIVE

## 2023-02-15 LAB — TROPONIN I (HIGH SENSITIVITY)
Troponin I (High Sensitivity): 6 ng/L (ref <18)
Troponin I (High Sensitivity): 5 ng/L (ref <18)

## 2023-02-15 MED ORDER — LORAZEPAM 2 MG/ML IJ SOLN
0.5000 mg | Freq: Once | INTRAMUSCULAR | Status: AC
  Administered 2023-02-15: 0.5 mg via INTRAVENOUS
  Filled 2023-02-15: qty 1

## 2023-02-15 MED ORDER — LACTATED RINGERS IV BOLUS
1000.0000 mL | Freq: Once | INTRAVENOUS | Status: AC
  Administered 2023-02-15: 1000 mL via INTRAVENOUS

## 2023-02-15 MED ORDER — POTASSIUM CHLORIDE CRYS ER 20 MEQ PO TBCR
40.0000 meq | EXTENDED_RELEASE_TABLET | Freq: Once | ORAL | Status: AC
  Administered 2023-02-15: 40 meq via ORAL
  Filled 2023-02-15: qty 2

## 2023-02-15 MED ORDER — PROCHLORPERAZINE EDISYLATE 10 MG/2ML IJ SOLN
10.0000 mg | Freq: Once | INTRAMUSCULAR | Status: AC
  Administered 2023-02-15: 10 mg via INTRAVENOUS
  Filled 2023-02-15: qty 2

## 2023-02-15 MED ORDER — IOHEXOL 350 MG/ML SOLN
75.0000 mL | Freq: Once | INTRAVENOUS | Status: AC | PRN
  Administered 2023-02-15: 75 mL via INTRAVENOUS

## 2023-02-15 MED ORDER — DIPHENHYDRAMINE HCL 50 MG/ML IJ SOLN
25.0000 mg | Freq: Once | INTRAMUSCULAR | Status: AC
  Administered 2023-02-15: 25 mg via INTRAVENOUS
  Filled 2023-02-15: qty 1

## 2023-02-15 MED ORDER — MAGNESIUM OXIDE -MG SUPPLEMENT 400 (240 MG) MG PO TABS
800.0000 mg | ORAL_TABLET | Freq: Once | ORAL | Status: AC
  Administered 2023-02-15: 800 mg via ORAL
  Filled 2023-02-15: qty 2

## 2023-02-15 NOTE — ED Notes (Signed)
Patient transported to CT 

## 2023-02-15 NOTE — ED Provider Notes (Signed)
[place] EMERGENCY DEPARTMENT AT [facility] Provider Note  CSN: [id number] Arrival date & time: 02/15/23 5284  Chief Complaint(s) No chief complaint on file.  HPI Marissa [name] is a 77 y.o. female with PMH A-fib on Eliquis, breast cancer status post radiation and lumpectomy maintained on anastrozole who presents emergency room for evaluation of shortness of breath, headache and near syncope.  Patient states that she awoke this morning feeling well but had sudden onset chest pain, pressure, headache and near syncope.  Patient arrives very anxious, hyperventilating and complaining of shortness of breath.  Denies chest pain, abdominal pain, nausea, vomiting or other systemic symptoms.   Past Medical History Past Medical History:  Diagnosis Date   Anxiety    Atrial fibrillation (HCC)    Cancer (HCC) 05/2017   right breast cancer   Family history of breast cancer    Family history of ovarian cancer    Family history of stomach cancer    GERD (gastroesophageal reflux disease)    High cholesterol    History of radiation therapy 07/03/17- 07/31/2017   Right Breast/ 40/05 Gy in 15 fractions, Right Breast boost/ 10 Gy in 5 fractions.    Hypertension    Personal history of radiation therapy 2018/2019   Patient Active Problem List   Diagnosis Date Noted   Osteopenia 09/19/2022   Genetic testing 07/08/2017   Family history of breast cancer    Family history of ovarian cancer    Family history of stomach cancer    Carcinoma of upper-outer quadrant of right breast in female, estrogen receptor positive (HCC) 06/11/2017   Hyperlipidemia 06/25/2016   Pain in joint, shoulder region 06/25/2016   Gastric mass 06/10/2014   Chest pain 02/15/2014   HTN (hypertension) 02/15/2014   Morbid obesity (HCC) 02/15/2014   Home Medication(s) Prior to Admission medications   Medication Sig Start Date End Date Taking? Authorizing Provider  amLODipine (NORVASC) 10 MG tablet Take 10 mg by  mouth daily.    [provider]  anastrozole (ARIMIDEX) 1 MG tablet Take 1 tablet (1 mg total) by mouth daily. 12/18/22   Doreatha Massed, MD  apixaban (ELIQUIS) 5 MG TABS tablet TAKE 1 TABLET TWICE A DAY 09/24/22   Antoine Poche, MD  Cholecalciferol (VITAMIN D) 125 MCG (5000 UT) CAPS Take 5,000 Units by mouth once a week.    [provider]  Coenzyme Q10 200 MG capsule Take 200 mg by mouth daily.    [provider]  denosumab (PROLIA) 60 MG/ML SOSY injection Inject 60 mg into the skin every 6 (six) months.    [provider]  diclofenac sodium (VOLTAREN) 1 % GEL Apply 2 g topically 4 (four) times daily as needed. 04/16/19   Long, Arlyss Repress, MD  ezetimibe (ZETIA) 10 MG tablet Take 10 mg by mouth daily. 09/08/21   [provider]  Boris Lown Oil 500 MG CAPS Take 500 mg by mouth daily.    [provider]  metoprolol succinate (TOPROL XL) 25 MG 24 hr tablet Take 1.5 tablets (37.5 mg total) by mouth daily. 10/12/21   Antoine Poche, MD  omeprazole (PRILOSEC) 20 MG capsule Take 20 mg by mouth daily.    [provider]  Past Surgical History Past Surgical History:  Procedure Laterality Date   BREAST BIOPSY Right 05/09/2017   x2   BREAST LUMPECTOMY Right 05/20/2017   CARDIAC CATHETERIZATION     2008- negative findings   CATARACT EXTRACTION W/PHACO Right 09/06/2014   Procedure: CATARACT EXTRACTION PHACO AND INTRAOCULAR LENS PLACEMENT; CDE:  3.85;  Surgeon: Susa Simmonds, MD;  Location: [facility];  Service: Ophthalmology;  Laterality: Right;   CATARACT EXTRACTION W/PHACO Left 12/13/2014   Procedure: CATARACT EXTRACTION PHACO AND INTRAOCULAR LENS PLACEMENT LEFT EYE CDE=10.79;  Surgeon: Susa Simmonds, MD;  Location: [facility];  Service: Ophthalmology;  Laterality: Left;   COLONOSCOPY WITH PROPOFOL N/A 10/28/2020    Procedure: COLONOSCOPY WITH PROPOFOL;  Surgeon: Dolores Frame, MD;  Location: [facility];  Service: Gastroenterology;  Laterality: N/A;  AM   ESOPHAGOGASTRODUODENOSCOPY N/A 05/05/2014   Procedure: ESOPHAGOGASTRODUODENOSCOPY (EGD);  Surgeon: Malissa Hippo, MD;  Location: [facility];  Service: Endoscopy;  Laterality: N/A;  255   EUS N/A 06/10/2014   Procedure: UPPER ENDOSCOPIC ULTRASOUND (EUS) LINEAR;  Surgeon: Rachael Fee, MD;  Location: [facility];  Service: Endoscopy;  Laterality: N/A;   POLYPECTOMY  10/28/2020   Procedure: POLYPECTOMY;  Surgeon: Dolores Frame, MD;  Location: [facility];  Service: Gastroenterology;;   RADIOACTIVE SEED GUIDED PARTIAL MASTECTOMY WITH AXILLARY SENTINEL LYMPH NODE BIOPSY Right 05/20/2017   Procedure: RIGHT BREAST LUMPECTOMY WITH RADIOACTIVE SEED AND SENTINEL LYMPH NODE BIOPSY;  Surgeon: Emelia Loron, MD;  Location: [facility];  Service: General;  Laterality: Right;   TUBAL LIGATION     Family History Family History  Problem Relation Age of Onset   CAD Mother 75   Leukemia Father    Parkinson's disease Father    Breast cancer Daughter 62       BRCA neg   Heart Problems Maternal Aunt    Cancer Maternal Uncle        NOS   Cancer Paternal Uncle        NOS   Diabetes Maternal Grandfather    Liver cancer Maternal Grandfather    Cancer Paternal Grandmother        NOS   Brain cancer Maternal Aunt    Lung cancer Maternal Aunt        smoker   Cancer Maternal Uncle        NOS   Stomach cancer Maternal Uncle    Lung cancer Maternal Uncle        smoker   Breast cancer Cousin        mat first cousin   Breast cancer Cousin        mat first cousin dx under 50   Breast cancer Cousin        2 additional mat first cousins   Ovarian cancer Cousin        mat first cousin   Cancer Cousin        several female mat first cousins with cancer NOS   Breast cancer Cousin        2 pat first cousins   Cancer  Cousin        3 pat first cousins with cancer NOS    Social History Social History   Tobacco Use   Smoking status: Never    Passive exposure: Never   Smokeless tobacco: Never  Vaping Use   Vaping status: Never Used  Substance Use Topics   Alcohol use: No   Drug use: No  Allergies Nitrostat [nitroglycerin]  Review of Systems Review of Systems  Respiratory:  Positive for shortness of breath.   Neurological:  Positive for light-headedness and headaches.    Physical Exam Vital Signs  I have reviewed the triage vital signs BP 123/78   Pulse 66   Temp 98.3 F (36.8 C) (Oral)   Resp (!) 23   Ht 5\' 5"  (1.651 m)   Wt 96.5 kg   SpO2 95%   BMI 35.40 kg/m   Physical Exam Vitals and nursing note reviewed.  Constitutional:      General: She is in acute distress.     Appearance: She is well-developed.  HENT:     Head: Normocephalic and atraumatic.  Eyes:     Conjunctiva/sclera: Conjunctivae normal.  Cardiovascular:     Rate and Rhythm: Normal rate and regular rhythm.     Heart sounds: No murmur heard. Pulmonary:     Effort: Pulmonary effort is normal. No respiratory distress.     Breath sounds: Normal breath sounds.  Abdominal:     Palpations: Abdomen is soft.     Tenderness: There is no abdominal tenderness.  Musculoskeletal:        General: No swelling.     Cervical back: Neck supple.  Skin:    General: Skin is warm and dry.     Capillary Refill: Capillary refill takes less than 2 seconds.  Neurological:     Mental Status: She is alert.  Psychiatric:        Mood and Affect: Mood normal.     ED Results and Treatments Labs (all labs ordered are listed, but only abnormal results are displayed) Labs Reviewed  COMPREHENSIVE METABOLIC PANEL - Abnormal; Notable for the following components:      Result Value   Sodium 134 (*)    Potassium 3.2 (*)    CO2 19 (*)    Glucose, Bld 107 (*)    All other components within normal limits  CBC WITH  DIFFERENTIAL/PLATELET - Abnormal; Notable for the following components:   Hemoglobin 15.2 (*)    All other components within normal limits  D-DIMER, QUANTITATIVE - Abnormal; Notable for the following components:   D-Dimer, Quant 0.88 (*)    All other components within normal limits  SARS CORONAVIRUS 2 BY RT PCR  BRAIN NATRIURETIC PEPTIDE  TROPONIN I (HIGH SENSITIVITY)  TROPONIN I (HIGH SENSITIVITY)                                                                                                                          Radiology CT Angio Chest PE W and/or Wo Contrast  Result Date: 02/15/2023 CLINICAL DATA:  Pulmonary embolism (PE) suspected, low to intermediate prob, positive D-dimer EXAM: CT ANGIOGRAPHY CHEST WITH CONTRAST TECHNIQUE: Multidetector CT imaging of the chest was performed using the standard protocol during bolus administration of intravenous contrast. Multiplanar CT image reconstructions and MIPs were obtained to evaluate the vascular anatomy. RADIATION DOSE REDUCTION: This exam  was performed according to the departmental dose-optimization program which includes automated exposure control, adjustment of the mA and/or kV according to patient size and/or use of iterative reconstruction technique. CONTRAST:  75mL OMNIPAQUE IOHEXOL 350 MG/ML SOLN COMPARISON:  CT 02/17/2014 FINDINGS: Cardiovascular: Satisfactory opacification of the pulmonary arteries to the segmental level. No evidence of pulmonary embolism. Thoracic aorta is nonaneurysmal. Scattered atherosclerotic vascular calcifications of the aorta and coronary arteries. Normal heart size. No pericardial effusion. Mediastinum/Nodes: No enlarged mediastinal, hilar, or axillary lymph nodes. Thyroid gland, trachea, and esophagus demonstrate no significant findings. Small hiatal hernia. Lungs/Pleura: Lungs are clear. No pleural effusion or pneumothorax. Upper Abdomen: Cholelithiasis. Partially imaged bilateral renal sinus cysts, which do not  require follow-up imaging. No acute abnormality. Musculoskeletal: No acute bony abnormality. Advanced degenerative changes of the bilateral glenohumeral joints. Degenerative changes within the spine. Postsurgical changes to the right breast. Review of the MIP images confirms the above findings. IMPRESSION: 1. No evidence of pulmonary embolism or other acute intrathoracic findings. 2. Cholelithiasis. 3. Aortic and coronary artery atherosclerosis (ICD10-I70.0). Electronically Signed   By: Duanne Guess D.O.   On: 02/15/2023 11:54   CT Head Wo Contrast  Result Date: 02/15/2023 CLINICAL DATA:  Headache, sudden, severe EXAM: CT HEAD WITHOUT CONTRAST TECHNIQUE: Contiguous axial images were obtained from the base of the skull through the vertex without intravenous contrast. RADIATION DOSE REDUCTION: This exam was performed according to the departmental dose-optimization program which includes automated exposure control, adjustment of the mA and/or kV according to patient size and/or use of iterative reconstruction technique. COMPARISON:  CTA head/neck 12/31/21 FINDINGS: Brain: No hemorrhage. No hydrocephalus. No extra-axial fluid collection. Sequela of mild chronic microvascular ischemic change. No CT evidence of an acute cortical infarct. Vascular: No hyperdense vessel or unexpected calcification. Skull: Normal. Negative for fracture or focal lesion. Sinuses/Orbits: Trace right mastoid effusion. No middle ear effusion. Paranasal sinuses are notable for mild mucosal thickening in the left maxillary sinus. Bilateral lens replacement. Orbits are otherwise unremarkable. Other: None. IMPRESSION: 1. No CT etiology for headaches identified 2. Trace right mastoid effusion. No middle ear effusion. Electronically Signed   By: Lorenza Cambridge M.D.   On: 02/15/2023 10:07   DG Chest Portable 1 View  Result Date: 02/15/2023 CLINICAL DATA:  Shortness of breath EXAM: PORTABLE CHEST 1 VIEW COMPARISON:  Chest x-ray dated August 11, 2022 FINDINGS: The heart size and mediastinal contours are within normal limits. Both lungs are clear. The visualized skeletal structures are unremarkable. IMPRESSION: No active disease. Electronically Signed   By: Allegra Lai M.D.   On: 02/15/2023 09:34    Pertinent labs & imaging results that were available during my care of the patient were reviewed by me and considered in my medical decision making (see MDM for details).  Medications Ordered in ED Medications  LORazepam (ATIVAN) injection 0.5 mg (0.5 mg Intravenous Given 02/15/23 0855)  prochlorperazine (COMPAZINE) injection 10 mg (10 mg Intravenous Given 02/15/23 0918)  diphenhydrAMINE (BENADRYL) injection 25 mg (25 mg Intravenous Given 02/15/23 0918)  lactated ringers bolus 1,000 mL (0 mLs Intravenous Stopped 02/15/23 1247)  potassium chloride SA (KLOR-CON M) CR tablet 40 mEq (40 mEq Oral Given 02/15/23 1003)  magnesium oxide (MAG-OX) tablet 800 mg (800 mg Oral Given 02/15/23 1003)  iohexol (OMNIPAQUE) 350 MG/ML injection 75 mL (75 mLs Intravenous Contrast Given 02/15/23 1044)  Procedures Procedures  (including critical care time)  Medical Decision Making / ED Course   This patient presents to the ED for concern of shortness of breath, near syncope, this involves an extensive number of treatment options, and is a complaint that carries with it a high risk of complications and morbidity.  The differential diagnosis includes Pe, PTX, Pulmonary Edema, ARDS, COPD/Asthma, ACS, CHF exacerbation, Arrhythmia, Pericardial Effusion/Tamponade, Anemia, Sepsis, Acidosis/Hypercapnia, Anxiety, Viral URI  MDM: Patient seen emerged part for evaluation of shortness of breath and near syncope.  On initial presentation, patient appears very anxious, hyperventilating but pulmonary exam otherwise unremarkable.  Abdominal exam  unremarkable.  Laboratory evaluation with mild hypokalemia to 3.2 which was repleted in the emergency department, CO2 19, high-sensitivity troponin and BNP are reassuringly negative.  Patient is COVID-negative.  Chest x-ray unremarkable she is endorsing a persistent headache associated with this episode and a CT head was obtained that was reassuringly negative for acute pathology.  A D-dimer was obtained that was minimally elevated at 0.88 and follow-up PE study is reassuringly negative.  After headache cocktail, patient feeling significant improved and is able to ambulate in emergency room without difficulty and without return of symptoms.  With overall reassuring workup, patient does not meet inpatient criteria for admission she is safe for discharge with outpatient follow-up.  Return precautions given to her and her family.  They voiced understanding of these.  Patient then discharged with outpatient follow-up   Additional history obtained: -Additional history obtained from multiple family members -External records from outside source obtained and reviewed including: Chart review including previous notes, labs, imaging, consultation notes   Lab Tests: -I ordered, reviewed, and interpreted labs.   The pertinent results include:   Labs Reviewed  COMPREHENSIVE METABOLIC PANEL - Abnormal; Notable for the following components:      Result Value   Sodium 134 (*)    Potassium 3.2 (*)    CO2 19 (*)    Glucose, Bld 107 (*)    All other components within normal limits  CBC WITH DIFFERENTIAL/PLATELET - Abnormal; Notable for the following components:   Hemoglobin 15.2 (*)    All other components within normal limits  D-DIMER, QUANTITATIVE - Abnormal; Notable for the following components:   D-Dimer, Quant 0.88 (*)    All other components within normal limits  SARS CORONAVIRUS 2 BY RT PCR  BRAIN NATRIURETIC PEPTIDE  TROPONIN I (HIGH SENSITIVITY)  TROPONIN I (HIGH SENSITIVITY)       Imaging  Studies ordered: I ordered imaging studies including CT head, CT PE, chest x-ray I independently visualized and interpreted imaging. I agree with the radiologist interpretation   Medicines ordered and prescription drug management: Meds ordered this encounter  Medications   LORazepam (ATIVAN) injection 0.5 mg   prochlorperazine (COMPAZINE) injection 10 mg   diphenhydrAMINE (BENADRYL) injection 25 mg   lactated ringers bolus 1,000 mL   potassium chloride SA (KLOR-CON M) CR tablet 40 mEq   magnesium oxide (MAG-OX) tablet 800 mg   iohexol (OMNIPAQUE) 350 MG/ML injection 75 mL    -I have reviewed the patients home medicines and have made adjustments as needed  Critical interventions none   Cardiac Monitoring: The patient was maintained on a cardiac monitor.  I personally viewed and interpreted the cardiac monitored which showed an underlying rhythm of: NSR  Social Determinants of Health:  Factors impacting patients care include: none   Reevaluation: After the interventions noted above, I reevaluated the patient and found that they  have :improved  Co morbidities that complicate the patient evaluation  Past Medical History:  Diagnosis Date   Anxiety    Atrial fibrillation (HCC)    Cancer (HCC) 05/2017   right breast cancer   Family history of breast cancer    Family history of ovarian cancer    Family history of stomach cancer    GERD (gastroesophageal reflux disease)    High cholesterol    History of radiation therapy 07/03/17- 07/31/2017   Right Breast/ 40/05 Gy in 15 fractions, Right Breast boost/ 10 Gy in 5 fractions.    Hypertension    Personal history of radiation therapy 2018/2019      Dispostion: I considered admission for this patient, but at this time she does not meet inpatient criteria for admission and she is safe for discharge with outpatient follow-up     Final Clinical Impression(s) / ED Diagnoses Final diagnoses:  Near syncope  Dyspnea,  unspecified type     @PCDICTATION @    Glendora Score, MD 02/15/23 1728

## 2023-02-15 NOTE — ED Triage Notes (Signed)
Pt c/o dizziness, headache, Hx of a fib, also c/o sob

## 2023-02-15 NOTE — ED Notes (Signed)
Pt assisted to bathroom via wheelchair.

## 2023-02-21 DIAGNOSIS — Z299 Encounter for prophylactic measures, unspecified: Secondary | ICD-10-CM | POA: Diagnosis not present

## 2023-02-21 DIAGNOSIS — I10 Essential (primary) hypertension: Secondary | ICD-10-CM | POA: Diagnosis not present

## 2023-02-21 DIAGNOSIS — E876 Hypokalemia: Secondary | ICD-10-CM | POA: Diagnosis not present

## 2023-02-22 ENCOUNTER — Telehealth: Payer: Self-pay

## 2023-02-22 NOTE — Telephone Encounter (Signed)
Transition Care Management Follow-up Telephone Call Date of discharge and from where: 02/15/2023 [facility] How have you been since you were released from the hospital? Patient is feeling much better. Any questions or concerns? No  Items Reviewed: Did the pt receive and understand the discharge instructions provided? Yes  Medications obtained and verified? Yes  Other? No  Any new allergies since your discharge? No  Dietary orders reviewed? Yes Do you have support at home? Yes   Follow up appointments reviewed:  PCP Hospital f/u appt confirmed?  Patient stated she followed-up with PCP 02/21/2023  Scheduled to see  on  @ . Specialist Hospital f/u appt confirmed? Yes  Scheduled to see ACAPA Nurrse on 03/25/2023 @ [facility]. Are transportation arrangements needed? No  If their condition worsens, is the pt aware to call PCP or go to the Emergency Dept.? Yes Was the patient provided with contact information for the PCP's office or ED? Yes Was to pt encouraged to call back with questions or concerns? Yes  [facility]  [facility] Population Health Community Resource Care Guide   [email or web address]  ?? [phone]   Website: [email or web address]  [email or web address]

## 2023-03-01 ENCOUNTER — Other Ambulatory Visit: Payer: Self-pay

## 2023-03-01 DIAGNOSIS — Z170 Estrogen receptor positive status [ER+]: Secondary | ICD-10-CM

## 2023-03-01 DIAGNOSIS — M8580 Other specified disorders of bone density and structure, unspecified site: Secondary | ICD-10-CM

## 2023-03-18 DIAGNOSIS — M8580 Other specified disorders of bone density and structure, unspecified site: Secondary | ICD-10-CM | POA: Diagnosis not present

## 2023-03-18 DIAGNOSIS — Z170 Estrogen receptor positive status [ER+]: Secondary | ICD-10-CM | POA: Diagnosis not present

## 2023-03-18 DIAGNOSIS — C50411 Malignant neoplasm of upper-outer quadrant of right female breast: Secondary | ICD-10-CM | POA: Diagnosis not present

## 2023-03-21 ENCOUNTER — Other Ambulatory Visit: Payer: Self-pay | Admitting: Cardiology

## 2023-03-21 NOTE — Telephone Encounter (Signed)
Prescription refill request for Eliquis received. Indication: PAF Last office visit: 10/11/22  Dominga Ferry MD Scr: 0.82 on 02/15/23  Epic Age: 77 Weight: 99.2kg  Based on above findings Eliquis 5mg  twice daily is the appropriate dose.  Refill approved.

## 2023-03-25 ENCOUNTER — Inpatient Hospital Stay: Payer: Medicare Other | Attending: Hematology

## 2023-03-25 VITALS — BP 135/56 | HR 72 | Temp 97.7°F | Resp 18

## 2023-03-25 DIAGNOSIS — M8580 Other specified disorders of bone density and structure, unspecified site: Secondary | ICD-10-CM | POA: Insufficient documentation

## 2023-03-25 DIAGNOSIS — C50411 Malignant neoplasm of upper-outer quadrant of right female breast: Secondary | ICD-10-CM | POA: Diagnosis not present

## 2023-03-25 MED ORDER — DENOSUMAB 60 MG/ML ~~LOC~~ SOSY
60.0000 mg | PREFILLED_SYRINGE | Freq: Once | SUBCUTANEOUS | Status: AC
  Administered 2023-03-25: 60 mg via SUBCUTANEOUS
  Filled 2023-03-25: qty 1

## 2023-03-25 NOTE — Patient Instructions (Signed)
[facility]  Discharge Instructions: Thank you for choosing [facility] to provide your oncology and hematology care.  If you have a lab appointment with the Cancer Center - please note that after April 8th, 2024, all labs will be drawn in the cancer center.  You do not have to check in or register with the main entrance as you have in the past but will complete your check-in in the cancer center.  Wear comfortable clothing and clothing appropriate for easy access to any Portacath or PICC line.   We strive to give you quality time with your provider. You may need to reschedule your appointment if you arrive late (15 or more minutes).  Arriving late affects you and other patients whose appointments are after yours.  Also, if you miss three or more appointments without notifying the office, you may be dismissed from the clinic at the provider's discretion.      For prescription refill requests, have your pharmacy contact our office and allow 72 hours for refills to be completed.    Today you received the following injection, prolia   To help prevent nausea and vomiting after your treatment, we encourage you to take your nausea medication as directed.  BELOW ARE SYMPTOMS THAT SHOULD BE REPORTED IMMEDIATELY: *FEVER GREATER THAN 100.4 F (38 C) OR HIGHER *CHILLS OR SWEATING *NAUSEA AND VOMITING THAT IS NOT CONTROLLED WITH YOUR NAUSEA MEDICATION *UNUSUAL SHORTNESS OF BREATH *UNUSUAL BRUISING OR BLEEDING *URINARY PROBLEMS (pain or burning when urinating, or frequent urination) *BOWEL PROBLEMS (unusual diarrhea, constipation, pain near the anus) TENDERNESS IN MOUTH AND THROAT WITH OR WITHOUT PRESENCE OF ULCERS (sore throat, sores in mouth, or a toothache) UNUSUAL RASH, SWELLING OR PAIN  UNUSUAL VAGINAL DISCHARGE OR ITCHING   Items with * indicate a potential emergency and should be followed up as soon as possible or go to the Emergency Department if any problems  should occur.  Please show the CHEMOTHERAPY ALERT CARD or IMMUNOTHERAPY ALERT CARD at check-in to the Emergency Department and triage nurse.  Should you have questions after your visit or need to cancel or reschedule your appointment, please contact [facility] [phone]  and follow the prompts.  Office hours are 8:00 a.m. to 4:30 p.m. Monday - Friday. Please note that voicemails left after 4:00 p.m. may not be returned until the following business day.  We are closed weekends and major holidays. You have access to a nurse at all times for urgent questions. Please call the main number to the clinic [phone] and follow the prompts.  For any non-urgent questions, you may also contact your provider using MyChart. We now offer e-Visits for anyone 5 and older to request care online for non-urgent symptoms. For details visit [email or web address].   Also download the MyChart app! Go to the app store, search "MyChart", open the app, select [place], and log in with your MyChart username and password.

## 2023-03-25 NOTE — Progress Notes (Signed)
Prolia injecton  given per orders. Patient tolerated it well without problems. Vitals stable and discharged home from clinic ambulatory. Follow up as scheduled.

## 2023-03-27 DIAGNOSIS — M19011 Primary osteoarthritis, right shoulder: Secondary | ICD-10-CM | POA: Diagnosis not present

## 2023-04-17 ENCOUNTER — Encounter: Payer: Self-pay | Admitting: Cardiology

## 2023-04-17 ENCOUNTER — Ambulatory Visit: Payer: Medicare Other | Attending: Cardiology | Admitting: Cardiology

## 2023-04-17 ENCOUNTER — Telehealth: Payer: Self-pay | Admitting: Cardiology

## 2023-04-17 VITALS — BP 128/68 | HR 59 | Ht 65.0 in | Wt 212.8 lb

## 2023-04-17 DIAGNOSIS — I480 Paroxysmal atrial fibrillation: Secondary | ICD-10-CM | POA: Insufficient documentation

## 2023-04-17 DIAGNOSIS — I10 Essential (primary) hypertension: Secondary | ICD-10-CM | POA: Diagnosis not present

## 2023-04-17 DIAGNOSIS — D6869 Other thrombophilia: Secondary | ICD-10-CM | POA: Diagnosis not present

## 2023-04-17 NOTE — Patient Instructions (Signed)
Medication Instructions:  Continue all current medications.   Labwork: none  Testing/Procedures: none  Follow-Up: 6 months   Any Other Special Instructions Will Be Listed Below (If Applicable).   If you need a refill on your cardiac medications before your next appointment, please call your pharmacy.  

## 2023-04-17 NOTE — Telephone Encounter (Signed)
Pre-operative Risk Assessment    Patient Name: Marissa [name]  DOB: [birth year] MRN: [id number]{    Request for Surgical Clearance    Procedure:  Dental Extraction - Amount of Teeth to be Pulled:  1  2. When is this surgery scheduled? Press F2 to enter date below and place date in Reason for Visit (see directions below). :1} Date of Surgery:  Clearance TBD                                 Surgeon:  Dr. Gwenevere Ghazi Group or Practice Name:   Phone number:  [phone] Fax number:  [phone]   Type of Clearance Requested:   - Pharmacy:  Hold Apixaban (Eliquis)     Type of Anesthesia:  Not Indicated   Additional requests/questions:  Please fax a copy of   to the surgeon's office.  Signed, Vallarie Mare   04/17/2023, 3:31 PM

## 2023-04-17 NOTE — Progress Notes (Signed)
Clinical Summary Marissa [name] is a 77 y.o.female seen today for follow up of the following medical problems.      1.PAF -new daignosis during  ER visit 10/09/21 with chest pain and palpitations   - no recent palpitations - compliant with meds - no bleeding on eliquis     2. HTN - she is compliant with meds     3.History of chest pain - 2017 nuclear stress without ischemia -01/2022 nuclear stress: no ischemia   - no recent symptoms       4. History of breast cancer     5. Headache/Dizziness - ER visit 02/15/23. She describes headache and vertigo - from notes symptoms resolved after headache cocktail  6. HLD - she is on zetia 10mg  daily - reports pcp tried several different statins - labs followed by pcp     Grandaughter graduating from [employer] this year, going to [facility] for speech therapy Past Medical History:  Diagnosis Date   Anxiety    Atrial fibrillation (HCC)    Cancer (HCC) 05/2017   right breast cancer   Family history of breast cancer    Family history of ovarian cancer    Family history of stomach cancer    GERD (gastroesophageal reflux disease)    High cholesterol    History of radiation therapy 07/03/17- 07/31/2017   Right Breast/ 40/05 Gy in 15 fractions, Right Breast boost/ 10 Gy in 5 fractions.    Hypertension    Personal history of radiation therapy 2018/2019     Allergies  Allergen Reactions   Nitrostat [Nitroglycerin] Shortness Of Breath     Current Outpatient Medications  Medication Sig Dispense Refill   amLODipine (NORVASC) 10 MG tablet Take 10 mg by mouth daily.     anastrozole (ARIMIDEX) 1 MG tablet Take 1 tablet (1 mg total) by mouth daily. 90 tablet 3   Cholecalciferol (VITAMIN D) 125 MCG (5000 UT) CAPS Take 5,000 Units by mouth once a week.     Coenzyme Q10 200 MG capsule Take 200 mg by mouth daily.     denosumab (PROLIA) 60 MG/ML SOSY injection Inject 60 mg into the skin every 6 (six) months.     diclofenac sodium  (VOLTAREN) 1 % GEL Apply 2 g topically 4 (four) times daily as needed. 100 g 0   ELIQUIS 5 MG TABS tablet TAKE 1 TABLET TWICE A DAY 180 tablet 1   ezetimibe (ZETIA) 10 MG tablet Take 10 mg by mouth daily.     Krill Oil 500 MG CAPS Take 500 mg by mouth daily.     metoprolol succinate (TOPROL XL) 25 MG 24 hr tablet Take 1.5 tablets (37.5 mg total) by mouth daily. 135 tablet 3   omeprazole (PRILOSEC) 20 MG capsule Take 20 mg by mouth daily.     No current facility-administered medications for this visit.     Past Surgical History:  Procedure Laterality Date   BREAST BIOPSY Right 05/09/2017   x2   BREAST LUMPECTOMY Right 05/20/2017   CARDIAC CATHETERIZATION     2008- negative findings   CATARACT EXTRACTION W/PHACO Right 09/06/2014   Procedure: CATARACT EXTRACTION PHACO AND INTRAOCULAR LENS PLACEMENT; CDE:  3.85;  Surgeon: Susa Simmonds, MD;  Location: [facility];  Service: Ophthalmology;  Laterality: Right;   CATARACT EXTRACTION W/PHACO Left 12/13/2014   Procedure: CATARACT EXTRACTION PHACO AND INTRAOCULAR LENS PLACEMENT LEFT EYE CDE=10.79;  Surgeon: Susa Simmonds, MD;  Location: [facility];  Service: Ophthalmology;  Laterality: Left;   COLONOSCOPY WITH PROPOFOL N/A 10/28/2020   Procedure: COLONOSCOPY WITH PROPOFOL;  Surgeon: Dolores Frame, MD;  Location: [facility];  Service: Gastroenterology;  Laterality: N/A;  AM   ESOPHAGOGASTRODUODENOSCOPY N/A 05/05/2014   Procedure: ESOPHAGOGASTRODUODENOSCOPY (EGD);  Surgeon: Malissa Hippo, MD;  Location: [facility];  Service: Endoscopy;  Laterality: N/A;  255   EUS N/A 06/10/2014   Procedure: UPPER ENDOSCOPIC ULTRASOUND (EUS) LINEAR;  Surgeon: Rachael Fee, MD;  Location: [facility];  Service: Endoscopy;  Laterality: N/A;   POLYPECTOMY  10/28/2020   Procedure: POLYPECTOMY;  Surgeon: Dolores Frame, MD;  Location: [facility];  Service: Gastroenterology;;   RADIOACTIVE SEED GUIDED PARTIAL MASTECTOMY WITH AXILLARY SENTINEL  LYMPH NODE BIOPSY Right 05/20/2017   Procedure: RIGHT BREAST LUMPECTOMY WITH RADIOACTIVE SEED AND SENTINEL LYMPH NODE BIOPSY;  Surgeon: Emelia Loron, MD;  Location: [facility];  Service: General;  Laterality: Right;   TUBAL LIGATION       Allergies  Allergen Reactions   Nitrostat [Nitroglycerin] Shortness Of Breath      Family History  Problem Relation Age of Onset   CAD Mother 49   Leukemia Father    Parkinson's disease Father    Breast cancer Daughter 9       BRCA neg   Heart Problems Maternal Aunt    Cancer Maternal Uncle        NOS   Cancer Paternal Uncle        NOS   Diabetes Maternal Grandfather    Liver cancer Maternal Grandfather    Cancer Paternal Grandmother        NOS   Brain cancer Maternal Aunt    Lung cancer Maternal Aunt        smoker   Cancer Maternal Uncle        NOS   Stomach cancer Maternal Uncle    Lung cancer Maternal Uncle        smoker   Breast cancer Cousin        mat first cousin   Breast cancer Cousin        mat first cousin dx under 50   Breast cancer Cousin        2 additional mat first cousins   Ovarian cancer Cousin        mat first cousin   Cancer Cousin        several female mat first cousins with cancer NOS   Breast cancer Cousin        2 pat first cousins   Cancer Cousin        3 pat first cousins with cancer NOS     Social History Ms. Scorza reports that she has never smoked. She has never been exposed to tobacco smoke. She has never used smokeless tobacco. Ms. Lazenby reports no history of alcohol use.   Review of Systems CONSTITUTIONAL: No weight loss, fever, chills, weakness or fatigue.  HEENT: Eyes: No visual loss, blurred vision, double vision or yellow sclerae.No hearing loss, sneezing, congestion, runny nose or sore throat.  SKIN: No rash or itching.  CARDIOVASCULAR: per hpi RESPIRATORY: No shortness of breath, cough or sputum.  GASTROINTESTINAL: No anorexia, nausea, vomiting or diarrhea. No  abdominal pain or blood.  GENITOURINARY: No burning on urination, no polyuria NEUROLOGICAL: No headache, dizziness, syncope, paralysis, ataxia, numbness or tingling in the extremities. No change in bowel or bladder control.  MUSCULOSKELETAL: No muscle, back pain, joint  pain or stiffness.  LYMPHATICS: No enlarged nodes. No history of splenectomy.  PSYCHIATRIC: No history of depression or anxiety.  ENDOCRINOLOGIC: No reports of sweating, cold or heat intolerance. No polyuria or polydipsia.  Marland Kitchen   Physical Examination Today's Vitals   04/17/23 0828  BP: 128/68  Pulse: (!) 59  SpO2: 96%  Weight: 212 lb 12.8 oz (96.5 kg)  Height: 5\' 5"  (1.651 m)   Body mass index is 35.41 kg/m.  Gen: resting comfortably, no acute distress HEENT: no scleral icterus, pupils equal round and reactive, no palptable cervical adenopathy,  CV: RRR, no m/r,g no jvd Resp: Clear to auscultation bilaterally GI: abdomen is soft, non-tender, non-distended, normal bowel sounds, no hepatosplenomegaly MSK: extremities are warm, no edema.  Skin: warm, no rash Neuro:  no focal deficits Psych: appropriate affect   Diagnostic Studies   06/2016 nuclear stress There was no ST segment deviation noted during stress. The study is normal. No gross ischemic territories nor evidence of scar. This is a low risk study. Nuclear stress EF: 60%.     06/2016 echo Study Conclusions   - Left ventricle: The cavity size was normal. Wall thickness was    normal. Systolic function was normal. The estimated ejection    fraction was in the range of 60% to 65%. Wall motion was normal;    there were no regional wall motion abnormalities. Doppler    parameters are consistent with abnormal left ventricular    relaxation (grade 1 diastolic dysfunction).  - Left atrium: The atrium was mildly dilated.  - Tricuspid valve: There was mild regurgitation.    10/2021 echo IMPRESSIONS     1. Left ventricular ejection fraction, by  estimation, is 60 to 65%. The  left ventricle has normal function. The left ventricle has no regional  wall motion abnormalities. Left ventricular diastolic parameters are  consistent with Grade I diastolic  dysfunction (impaired relaxation).   2. Right ventricular systolic function is normal. The right ventricular  size is normal. There is normal pulmonary artery systolic pressure.   3. The mitral valve is normal in structure. No evidence of mitral valve  regurgitation. No evidence of mitral stenosis.   4. The aortic valve is tricuspid. Aortic valve regurgitation is not  visualized. No aortic stenosis is present.   5. The inferior vena cava is normal in size with greater than 50%  respiratory variability, suggesting right atrial pressure of 3 mmHg.    01/2022 nuclear stress Normal resting and stress perfusion. No ischemia or infarction EF 69%        Assessment and Plan  1.PAF/acquired thrombophilia - no recent symptoms - continue current meds including eliquis for stroke prevention   2.HTN -her bp is at goal, continue current meds   F/u 6 months     Antoine Poche, M.D

## 2023-04-18 NOTE — Telephone Encounter (Signed)
Patient Name: Marissa [name]  DOB: 30-May-1946 MRN: [id number]  Primary Cardiologist: Dina Rich, MD  Chart reviewed as part of pre-operative protocol coverage.   Simple dental extractions (i.e. 1-2 teeth) are considered low risk procedures per guidelines and generally do not require any specific cardiac clearance. It is also generally accepted that for simple extractions and dental cleanings, there is no need to interrupt blood thinner therapy.   SBE prophylaxis is not required for the patient from a cardiac standpoint.  I will route this recommendation to the requesting party via Epic fax function and remove from pre-op pool.  Please call with questions.  Joylene Grapes, NP 04/18/2023, 11:38 AM

## 2023-04-22 DIAGNOSIS — H47093 Other disorders of optic nerve, not elsewhere classified, bilateral: Secondary | ICD-10-CM | POA: Diagnosis not present

## 2023-04-23 DIAGNOSIS — Z23 Encounter for immunization: Secondary | ICD-10-CM | POA: Diagnosis not present

## 2023-04-30 DIAGNOSIS — M1990 Unspecified osteoarthritis, unspecified site: Secondary | ICD-10-CM | POA: Diagnosis not present

## 2023-04-30 DIAGNOSIS — I700 Atherosclerosis of aorta: Secondary | ICD-10-CM | POA: Diagnosis not present

## 2023-04-30 DIAGNOSIS — C50911 Malignant neoplasm of unspecified site of right female breast: Secondary | ICD-10-CM | POA: Diagnosis not present

## 2023-04-30 DIAGNOSIS — I10 Essential (primary) hypertension: Secondary | ICD-10-CM | POA: Diagnosis not present

## 2023-04-30 DIAGNOSIS — Z299 Encounter for prophylactic measures, unspecified: Secondary | ICD-10-CM | POA: Diagnosis not present

## 2023-04-30 DIAGNOSIS — R252 Cramp and spasm: Secondary | ICD-10-CM | POA: Diagnosis not present

## 2023-05-20 ENCOUNTER — Other Ambulatory Visit: Payer: Self-pay | Admitting: Hematology

## 2023-05-20 DIAGNOSIS — Z170 Estrogen receptor positive status [ER+]: Secondary | ICD-10-CM

## 2023-05-20 DIAGNOSIS — Z1231 Encounter for screening mammogram for malignant neoplasm of breast: Secondary | ICD-10-CM

## 2023-05-27 ENCOUNTER — Ambulatory Visit: Payer: Medicare Other

## 2023-06-18 ENCOUNTER — Ambulatory Visit
Admission: RE | Admit: 2023-06-18 | Discharge: 2023-06-18 | Disposition: A | Discharge status: Home / Self Care | Payer: Medicare Other | Source: Ambulatory Visit | Attending: Hematology | Admitting: Hematology

## 2023-06-18 DIAGNOSIS — Z1231 Encounter for screening mammogram for malignant neoplasm of breast: Secondary | ICD-10-CM

## 2023-06-18 DIAGNOSIS — Z170 Estrogen receptor positive status [ER+]: Secondary | ICD-10-CM

## 2023-07-04 DIAGNOSIS — U071 COVID-19: Secondary | ICD-10-CM | POA: Diagnosis not present

## 2023-07-04 DIAGNOSIS — Z299 Encounter for prophylactic measures, unspecified: Secondary | ICD-10-CM | POA: Diagnosis not present

## 2023-07-04 DIAGNOSIS — J029 Acute pharyngitis, unspecified: Secondary | ICD-10-CM | POA: Diagnosis not present

## 2023-07-04 DIAGNOSIS — I480 Paroxysmal atrial fibrillation: Secondary | ICD-10-CM | POA: Diagnosis not present

## 2023-07-11 DIAGNOSIS — I700 Atherosclerosis of aorta: Secondary | ICD-10-CM | POA: Diagnosis not present

## 2023-07-11 DIAGNOSIS — Z299 Encounter for prophylactic measures, unspecified: Secondary | ICD-10-CM | POA: Diagnosis not present

## 2023-07-11 DIAGNOSIS — U071 COVID-19: Secondary | ICD-10-CM | POA: Diagnosis not present

## 2023-07-11 DIAGNOSIS — I480 Paroxysmal atrial fibrillation: Secondary | ICD-10-CM | POA: Diagnosis not present

## 2023-07-11 DIAGNOSIS — C50911 Malignant neoplasm of unspecified site of right female breast: Secondary | ICD-10-CM | POA: Diagnosis not present

## 2023-07-16 ENCOUNTER — Other Ambulatory Visit: Payer: Self-pay | Admitting: *Deleted

## 2023-07-16 DIAGNOSIS — Z170 Estrogen receptor positive status [ER+]: Secondary | ICD-10-CM

## 2023-07-16 MED ORDER — ANASTROZOLE 1 MG PO TABS
1.0000 mg | ORAL_TABLET | Freq: Every day | ORAL | 3 refills | Status: DC
Start: 2023-07-16 — End: 2024-05-13

## 2023-07-18 ENCOUNTER — Telehealth: Payer: Self-pay | Admitting: Cardiology

## 2023-07-18 MED ORDER — APIXABAN 5 MG PO TABS: 5.0000 mg | ORAL_TABLET | Freq: Two times a day (BID) | ORAL | 1 refills | Status: DC

## 2023-07-18 NOTE — Telephone Encounter (Signed)
 Prescription refill request for Eliquis received. Indication: PAF Last office visit: 04/17/23  Dominga Ferry MD Scr: 0.82 on 02/15/23 Age: 78 Weight: 96.5kg  Based on above findings Eliquis 5mg  twice daily is the appropriate dose.  Refill approved.

## 2023-07-18 NOTE — Telephone Encounter (Signed)
     1. Which medications need to be refilled? (please list name of each medication and dose if known)    Eliquis  5mg    2. Would you like to learn more about the convenience, safety, & potential cost savings by using the [facility]? no   3. Are you open to using the [facility]? No   4. Which pharmacy/location (including street and city if local pharmacy) is medication to be sent to?[facility]  [phone] Account# [id number]  5. Do they need a 30 day or 90 day supply? 90 supply

## 2023-07-30 MED ORDER — APIXABAN 5 MG PO TABS: 5.0000 mg | ORAL_TABLET | Freq: Two times a day (BID) | ORAL | 1 refills | Status: AC

## 2023-07-30 NOTE — Telephone Encounter (Signed)
Pt switched Pharmacy to [facility] order. Please send Rx there.

## 2023-07-30 NOTE — Addendum Note (Signed)
Addended by: Louanna Raw on: 07/30/2023 11:40 AM   Modules accepted: Orders

## 2023-08-17 DIAGNOSIS — M25562 Pain in left knee: Secondary | ICD-10-CM | POA: Diagnosis not present

## 2023-08-17 DIAGNOSIS — M25462 Effusion, left knee: Secondary | ICD-10-CM | POA: Diagnosis not present

## 2023-08-28 DIAGNOSIS — Z79899 Other long term (current) drug therapy: Secondary | ICD-10-CM | POA: Diagnosis not present

## 2023-08-28 DIAGNOSIS — Z1331 Encounter for screening for depression: Secondary | ICD-10-CM | POA: Diagnosis not present

## 2023-08-28 DIAGNOSIS — E559 Vitamin D deficiency, unspecified: Secondary | ICD-10-CM | POA: Diagnosis not present

## 2023-08-28 DIAGNOSIS — Z7189 Other specified counseling: Secondary | ICD-10-CM | POA: Diagnosis not present

## 2023-08-28 DIAGNOSIS — Z299 Encounter for prophylactic measures, unspecified: Secondary | ICD-10-CM | POA: Diagnosis not present

## 2023-08-28 DIAGNOSIS — Z0000 Encounter for general adult medical examination without abnormal findings: Secondary | ICD-10-CM | POA: Diagnosis not present

## 2023-08-28 DIAGNOSIS — R5383 Other fatigue: Secondary | ICD-10-CM | POA: Diagnosis not present

## 2023-08-28 DIAGNOSIS — Z1339 Encounter for screening examination for other mental health and behavioral disorders: Secondary | ICD-10-CM | POA: Diagnosis not present

## 2023-08-28 DIAGNOSIS — E7800 Pure hypercholesterolemia, unspecified: Secondary | ICD-10-CM | POA: Diagnosis not present

## 2023-08-30 DIAGNOSIS — M25562 Pain in left knee: Secondary | ICD-10-CM | POA: Diagnosis not present

## 2023-09-12 ENCOUNTER — Other Ambulatory Visit: Payer: Self-pay

## 2023-09-12 DIAGNOSIS — Z170 Estrogen receptor positive status [ER+]: Secondary | ICD-10-CM

## 2023-09-12 DIAGNOSIS — E559 Vitamin D deficiency, unspecified: Secondary | ICD-10-CM

## 2023-09-17 DIAGNOSIS — C50411 Malignant neoplasm of upper-outer quadrant of right female breast: Secondary | ICD-10-CM | POA: Diagnosis not present

## 2023-09-17 DIAGNOSIS — E559 Vitamin D deficiency, unspecified: Secondary | ICD-10-CM | POA: Diagnosis not present

## 2023-09-17 DIAGNOSIS — Z170 Estrogen receptor positive status [ER+]: Secondary | ICD-10-CM | POA: Diagnosis not present

## 2023-09-26 NOTE — Progress Notes (Signed)
 Labs 3/11/2-25 from [facility]  Calcium:  10.9 Albumin: 4.2  Marissa [name]

## 2023-09-27 ENCOUNTER — Inpatient Hospital Stay: Payer: Medicare Other | Attending: Hematology

## 2023-09-27 VITALS — BP 135/55 | HR 66 | Temp 97.0°F | Resp 18

## 2023-09-27 DIAGNOSIS — M8580 Other specified disorders of bone density and structure, unspecified site: Secondary | ICD-10-CM | POA: Insufficient documentation

## 2023-09-27 DIAGNOSIS — Z170 Estrogen receptor positive status [ER+]: Secondary | ICD-10-CM | POA: Insufficient documentation

## 2023-09-27 DIAGNOSIS — C50411 Malignant neoplasm of upper-outer quadrant of right female breast: Secondary | ICD-10-CM | POA: Insufficient documentation

## 2023-09-27 MED ORDER — DENOSUMAB 60 MG/ML ~~LOC~~ SOSY
60.0000 mg | PREFILLED_SYRINGE | Freq: Once | SUBCUTANEOUS | Status: AC
  Administered 2023-09-27: 60 mg via SUBCUTANEOUS
  Filled 2023-09-27: qty 1

## 2023-09-27 MED ORDER — OCTREOTIDE ACETATE 30 MG IM KIT
PACK | INTRAMUSCULAR | Status: AC
  Filled 2023-09-27: qty 1

## 2023-09-27 NOTE — Progress Notes (Signed)
 Patient presents today for Prolia injection.  Calcium noted to be 10.9 on 09/17/23.  Patient has been taking Calcium and vitamin D supplements, has had no prior or upcoming dental work and no jaw pain.  No new complaints at this time.  Stable during administration without incident; injection site WNL; see MAR for injection details.  Patient tolerated procedure well and without incident.  No questions or complaints noted at this time.

## 2023-09-27 NOTE — Patient Instructions (Signed)
 [facility] - A DEPT OF [facility]  Discharge Instructions: Thank you for choosing [facility] to provide your oncology and hematology care.  If you have a lab appointment with the Cancer Center - please note that after April 8th, 2024, all labs will be drawn in the cancer center.  You do not have to check in or register with the main entrance as you have in the past but will complete your check-in in the cancer center.  Wear comfortable clothing and clothing appropriate for easy access to any Portacath or PICC line.   We strive to give you quality time with your provider. You may need to reschedule your appointment if you arrive late (15 or more minutes).  Arriving late affects you and other patients whose appointments are after yours.  Also, if you miss three or more appointments without notifying the office, you may be dismissed from the clinic at the provider's discretion.      For prescription refill requests, have your pharmacy contact our office and allow 72 hours for refills to be completed.    Today you received the following chemotherapy and/or immunotherapy agents Prolia      To help prevent nausea and vomiting after your treatment, we encourage you to take your nausea medication as directed.  BELOW ARE SYMPTOMS THAT SHOULD BE REPORTED IMMEDIATELY: *FEVER GREATER THAN 100.4 F (38 C) OR HIGHER *CHILLS OR SWEATING *NAUSEA AND VOMITING THAT IS NOT CONTROLLED WITH YOUR NAUSEA MEDICATION *UNUSUAL SHORTNESS OF BREATH *UNUSUAL BRUISING OR BLEEDING *URINARY PROBLEMS (pain or burning when urinating, or frequent urination) *BOWEL PROBLEMS (unusual diarrhea, constipation, pain near the anus) TENDERNESS IN MOUTH AND THROAT WITH OR WITHOUT PRESENCE OF ULCERS (sore throat, sores in mouth, or a toothache) UNUSUAL RASH, SWELLING OR PAIN  UNUSUAL VAGINAL DISCHARGE OR ITCHING   Items with * indicate a potential emergency and should be followed up as  soon as possible or go to the Emergency Department if any problems should occur.  Please show the CHEMOTHERAPY ALERT CARD or IMMUNOTHERAPY ALERT CARD at check-in to the Emergency Department and triage nurse.  Should you have questions after your visit or need to cancel or reschedule your appointment, please contact [facility] [place] - A DEPT OF [facility] [phone]  and follow the prompts.  Office hours are 8:00 a.m. to 4:30 p.m. Monday - Friday. Please note that voicemails left after 4:00 p.m. may not be returned until the following business day.  We are closed weekends and major holidays. You have access to a nurse at all times for urgent questions. Please call the main number to the clinic [phone] and follow the prompts.  For any non-urgent questions, you may also contact your provider using MyChart. We now offer e-Visits for anyone 43 and older to request care online for non-urgent symptoms. For details visit [email or web address].   Also download the MyChart app! Go to the app store, search "MyChart", open the app, select [place], and log in with your MyChart username and password.

## 2023-10-18 ENCOUNTER — Ambulatory Visit: Admitting: Cardiology

## 2023-10-21 ENCOUNTER — Encounter: Payer: Self-pay | Admitting: *Deleted

## 2023-10-22 ENCOUNTER — Ambulatory Visit: Payer: Medicare Other | Admitting: Cardiology

## 2023-10-22 ENCOUNTER — Ambulatory Visit: Admitting: Cardiology

## 2023-11-18 DIAGNOSIS — I700 Atherosclerosis of aorta: Secondary | ICD-10-CM | POA: Diagnosis not present

## 2023-11-18 DIAGNOSIS — E7800 Pure hypercholesterolemia, unspecified: Secondary | ICD-10-CM | POA: Diagnosis not present

## 2023-11-18 DIAGNOSIS — R21 Rash and other nonspecific skin eruption: Secondary | ICD-10-CM | POA: Diagnosis not present

## 2023-11-18 DIAGNOSIS — Z299 Encounter for prophylactic measures, unspecified: Secondary | ICD-10-CM | POA: Diagnosis not present

## 2023-11-25 ENCOUNTER — Ambulatory Visit: Admitting: Nurse Practitioner

## 2023-12-04 ENCOUNTER — Other Ambulatory Visit: Payer: Self-pay

## 2023-12-04 DIAGNOSIS — E559 Vitamin D deficiency, unspecified: Secondary | ICD-10-CM

## 2023-12-04 DIAGNOSIS — C50411 Malignant neoplasm of upper-outer quadrant of right female breast: Secondary | ICD-10-CM

## 2023-12-04 DIAGNOSIS — Z170 Estrogen receptor positive status [ER+]: Secondary | ICD-10-CM

## 2023-12-05 ENCOUNTER — Inpatient Hospital Stay: Attending: Hematology

## 2023-12-05 DIAGNOSIS — M8580 Other specified disorders of bone density and structure, unspecified site: Secondary | ICD-10-CM | POA: Insufficient documentation

## 2023-12-05 DIAGNOSIS — C50411 Malignant neoplasm of upper-outer quadrant of right female breast: Secondary | ICD-10-CM | POA: Insufficient documentation

## 2023-12-05 DIAGNOSIS — E559 Vitamin D deficiency, unspecified: Secondary | ICD-10-CM | POA: Diagnosis not present

## 2023-12-05 LAB — CBC WITH DIFFERENTIAL/PLATELET
Abs Immature Granulocytes: 0.03 10*3/uL (ref 0.00–0.07)
Basophils Absolute: 0 10*3/uL (ref 0.0–0.1)
Basophils Relative: 0 %
Eosinophils Absolute: 0.2 10*3/uL (ref 0.0–0.5)
Eosinophils Relative: 2 %
HCT: 44.6 % (ref 36.0–46.0)
Hemoglobin: 14.4 g/dL (ref 12.0–15.0)
Immature Granulocytes: 0 %
Lymphocytes Relative: 17 %
Lymphs Abs: 1.3 10*3/uL (ref 0.7–4.0)
MCH: 31.3 pg (ref 26.0–34.0)
MCHC: 32.3 g/dL (ref 30.0–36.0)
MCV: 97 fL (ref 80.0–100.0)
Monocytes Absolute: 0.6 10*3/uL (ref 0.1–1.0)
Monocytes Relative: 7 %
Neutro Abs: 5.8 10*3/uL (ref 1.7–7.7)
Neutrophils Relative %: 74 %
Platelets: 209 10*3/uL (ref 150–400)
RBC: 4.6 MIL/uL (ref 3.87–5.11)
RDW: 12.1 % (ref 11.5–15.5)
WBC: 7.9 10*3/uL (ref 4.0–10.5)
nRBC: 0 % (ref 0.0–0.2)

## 2023-12-05 LAB — COMPREHENSIVE METABOLIC PANEL WITH GFR
ALT: 17 U/L (ref 0–44)
AST: 18 U/L (ref 15–41)
Albumin: 3.8 g/dL (ref 3.5–5.0)
Alkaline Phosphatase: 58 U/L (ref 38–126)
Anion gap: 9 (ref 5–15)
BUN: 25 mg/dL — ABNORMAL HIGH (ref 8–23)
CO2: 25 mmol/L (ref 22–32)
Calcium: 9.5 mg/dL (ref 8.9–10.3)
Chloride: 102 mmol/L (ref 98–111)
Creatinine, Ser: 0.73 mg/dL (ref 0.44–1.00)
GFR, Estimated: >60 mL/min (ref >60)
Glucose, Bld: 96 mg/dL (ref 70–99)
Potassium: 3.9 mmol/L (ref 3.5–5.1)
Sodium: 136 mmol/L (ref 135–145)
Total Bilirubin: 1 mg/dL (ref 0.0–1.2)
Total Protein: 7.1 g/dL (ref 6.5–8.1)

## 2023-12-05 LAB — VITAMIN D 25 HYDROXY (VIT D DEFICIENCY, FRACTURES): Vit D, 25-Hydroxy: 37.63 ng/mL (ref 30–100)

## 2023-12-09 ENCOUNTER — Encounter: Payer: Self-pay | Admitting: Nurse Practitioner

## 2023-12-09 ENCOUNTER — Ambulatory Visit: Attending: Nurse Practitioner | Admitting: Nurse Practitioner

## 2023-12-09 VITALS — BP 115/60 | HR 65 | Ht 65.0 in | Wt 208.6 lb

## 2023-12-09 DIAGNOSIS — I480 Paroxysmal atrial fibrillation: Secondary | ICD-10-CM | POA: Diagnosis not present

## 2023-12-09 DIAGNOSIS — I10 Essential (primary) hypertension: Secondary | ICD-10-CM | POA: Insufficient documentation

## 2023-12-09 DIAGNOSIS — E785 Hyperlipidemia, unspecified: Secondary | ICD-10-CM | POA: Diagnosis not present

## 2023-12-09 NOTE — Progress Notes (Unsigned)
 Cardiology Office Note   Date: 12/09/2023 ID:  Marissa, [name] June 11, 1946, MRN [id number] PCP: Theoplis Fix, MD  [facility] Providers Cardiologist:  Armida Lander, MD     History of Present Illness Marissa [name] is a 78 y.o. female with a PMH of chest pain, PAF, hypertension, hyperlipidemia, dizziness, past history of headache, history of breast cancer, who presents today for 46-month follow-up appointment.  Last seen by Dr. Armida Lander on April 17, 2023.  She was doing well at that time.  Today she presents for 46-month follow-up appointment.  She states she is doing well. Denies any chest pain, shortness of breath, palpitations, syncope, presyncope, dizziness, orthopnea, PND, swelling or significant weight changes, acute bleeding, or claudication.   ROS: Negative.  See HPI.  Studies Reviewed EKG Interpretation Date/Time:  Monday December 09 2023 13:32:26 EDT Ventricular Rate:  71 PR Interval:  132 QRS Duration:  90 QT Interval:  382 QTC Calculation: 415 R Axis:   6  Text Interpretation: Sinus rhythm with Premature atrial complexes with Abberant conduction Anterior infarct , age undetermined When compared with ECG of 15-Feb-2023 08:37, PREVIOUS ECG IS PRESENT Confirmed by Lasalle Pointer [phone]) on 12/09/2023 1:46:26 PM  EKG: EKG Interpretation Date/Time:  Monday December 09 2023 13:32:26 EDT Ventricular Rate:  71 PR Interval:  132 QRS Duration:  90 QT Interval:  382 QTC Calculation: 415 R Axis:   6  Text Interpretation: Sinus rhythm with Premature atrial complexes with Abberant conduction Anterior infarct , age undetermined When compared with ECG of 15-Feb-2023 08:37, PREVIOUS ECG IS PRESENT Confirmed by Lasalle Pointer [phone]) on 12/09/2023 1:46:26 PM   Lexiscan  01/2022:  Normal resting and stress perfusion. No ischemia or infarction EF 69%  Echo 10/2021:   1. Left ventricular ejection fraction, by estimation, is 60 to 65%. The  left ventricle has normal  function. The left ventricle has no regional  wall motion abnormalities. Left ventricular diastolic parameters are  consistent with Grade I diastolic  dysfunction (impaired relaxation).   2. Right ventricular systolic function is normal. The right ventricular  size is normal. There is normal pulmonary artery systolic pressure.   3. The mitral valve is normal in structure. No evidence of mitral valve  regurgitation. No evidence of mitral stenosis.   4. The aortic valve is tricuspid. Aortic valve regurgitation is not  visualized. No aortic stenosis is present.   5. The inferior vena cava is normal in size with greater than 50%  respiratory variability, suggesting right atrial pressure of 3 mmHg.  cMRI 03/2014:  IMPRESSION:  1) No RV mass review of echo suggests calcification of the apical RV  trabeculations No evidence of tumor or thrombus   2) Normal LV size and function EF 60%   3) Moderate LAE   4) Normal MV, TV and AV   5) Prominent epicardial fat with no effusion   6) No delayed gadolinium uptake in LV myocardium  Physical Exam VS:  BP 115/60   Pulse 65   Ht 5\' 5"  (1.651 m)   Wt 208 lb 9.6 oz (94.6 kg)   SpO2 96%   BMI 34.71 kg/m    Wt Readings from Last 3 Encounters:  12/09/23 208 lb 9.6 oz (94.6 kg)  04/17/23 212 lb 12.8 oz (96.5 kg)  02/15/23 212 lb 11.9 oz (96.5 kg)    GEN: Well nourished, well developed in no acute distress NECK: No JVD; No carotid bruits CARDIAC: S1/S2, RRR, no murmurs, rubs, gallops RESPIRATORY:  Clear to auscultation without rales, wheezing or rhonchi  ABDOMEN: Soft, non-tender, non-distended EXTREMITIES:  No edema; No deformity   ASSESSMENT AND PLAN  PAF Denies any tachycardia palpitations.  Heart rate is well-controlled today.  EKG reveals she is in normal sinus rhythm PACs.  Continue Toprol -XL.  Continue Eliquis  for stroke prevention.  She is on appropriate dosage denies any bleeding issues. Heart healthy diet and regular cardiovascular  exercise encouraged.  Discussed avoiding triggers to A-fib.  She verbalized understanding.  HTN Blood pressure stable and well-controlled.  No medication changes at this time. Heart healthy diet and regular cardiovascular exercise encouraged.   HLD LDL 131 in February 2025.  Her PCP is aware of this and managing this.  She defers management to PCP. Continue Zetia. Heart healthy diet and regular cardiovascular exercise encouraged. Continue to follow with PCP.     Dispo: Follow-up with Dr. Armida Lander or APP in 1 year or sooner if anything changes.  Signed, Lasalle Pointer, NP

## 2023-12-09 NOTE — Patient Instructions (Addendum)
 Medication Instructions:  Your physician recommends that you continue on your current medications as directed. Please refer to the Current Medication list given to you today.  Labwork: None   Testing/Procedures: None   Follow-Up: Your physician recommends that you schedule a follow-up appointment in: (1 Year) We will call you or send you a letter in the mail in about 10 months to get you scheduled.   Any Other Special Instructions Will Be Listed Below (If Applicable).  If you need a refill on your cardiac medications before your next appointment, please call your pharmacy.

## 2023-12-12 ENCOUNTER — Inpatient Hospital Stay: Admitting: Hematology

## 2023-12-17 ENCOUNTER — Inpatient Hospital Stay: Attending: Hematology | Admitting: Hematology

## 2023-12-17 VITALS — BP 130/55 | HR 60 | Temp 98.1°F | Resp 18 | Ht 65.0 in | Wt 207.4 lb

## 2023-12-17 DIAGNOSIS — Z1231 Encounter for screening mammogram for malignant neoplasm of breast: Secondary | ICD-10-CM

## 2023-12-17 DIAGNOSIS — M8580 Other specified disorders of bone density and structure, unspecified site: Secondary | ICD-10-CM | POA: Insufficient documentation

## 2023-12-17 DIAGNOSIS — C50411 Malignant neoplasm of upper-outer quadrant of right female breast: Secondary | ICD-10-CM | POA: Diagnosis not present

## 2023-12-17 DIAGNOSIS — Z170 Estrogen receptor positive status [ER+]: Secondary | ICD-10-CM | POA: Diagnosis not present

## 2023-12-17 DIAGNOSIS — N83201 Unspecified ovarian cyst, right side: Secondary | ICD-10-CM | POA: Insufficient documentation

## 2023-12-17 DIAGNOSIS — M810 Age-related osteoporosis without current pathological fracture: Secondary | ICD-10-CM | POA: Diagnosis not present

## 2023-12-17 DIAGNOSIS — E559 Vitamin D deficiency, unspecified: Secondary | ICD-10-CM

## 2023-12-17 DIAGNOSIS — Z79811 Long term (current) use of aromatase inhibitors: Secondary | ICD-10-CM | POA: Diagnosis not present
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
## 2023-12-17 NOTE — Patient Instructions (Signed)
 [facility] at [facility] Discharge Instructions   You were seen and examined today by Dr. Cheree Cords.  He reviewed the results of your lab work which are normal/stable.   We will see you back in 1 year. We will repeat a mammogram in December. We will repeat a bone density test prior to your next visit. We will repeat lab work prior to your next visit.    Return as scheduled.    Thank you for choosing [facility] at [facility] to provide your oncology and hematology care.  To afford each patient quality time with our provider, please arrive at least 15 minutes before your scheduled appointment time.   If you have a lab appointment with the Cancer Center please come in thru the Main Entrance and check in at the main information desk.  You need to re-schedule your appointment should you arrive 10 or more minutes late.  We strive to give you quality time with our providers, and arriving late affects you and other patients whose appointments are after yours.  Also, if you no show three or more times for appointments you may be dismissed from the clinic at the providers discretion.     Again, thank you for choosing [facility].  Our hope is that these requests will decrease the amount of time that you wait before being seen by our physicians.       _____________________________________________________________  Should you have questions after your visit to [facility], please contact our office at [phone] and follow the prompts.  Our office hours are 8:00 a.m. and 4:30 p.m. Monday - Friday.  Please note that voicemails left after 4:00 p.m. may not be returned until the following business day.  We are closed weekends and major holidays.  You do have access to a nurse 24-7, just call the main number to the clinic [phone] and do not press any options, hold on the line and a nurse will answer the phone.    For  prescription refill requests, have your pharmacy contact our office and allow 72 hours.    Due to Covid, you will need to wear a mask upon entering the hospital. If you do not have a mask, a mask will be given to you at the Main Entrance upon arrival. For doctor visits, patients may have 1 support person age 51 or older with them. For treatment visits, patients can not have anyone with them due to social distancing guidelines and our immunocompromised population.

## 2023-12-18 ENCOUNTER — Inpatient Hospital Stay: Payer: Medicare Other | Admitting: Hematology

## 2023-12-19 ENCOUNTER — Inpatient Hospital Stay: Admitting: Hematology

## 2024-01-01 DIAGNOSIS — C50911 Malignant neoplasm of unspecified site of right female breast: Secondary | ICD-10-CM | POA: Diagnosis not present

## 2024-01-01 DIAGNOSIS — I480 Paroxysmal atrial fibrillation: Secondary | ICD-10-CM | POA: Diagnosis not present

## 2024-01-01 DIAGNOSIS — M549 Dorsalgia, unspecified: Secondary | ICD-10-CM | POA: Diagnosis not present

## 2024-01-01 DIAGNOSIS — I700 Atherosclerosis of aorta: Secondary | ICD-10-CM | POA: Diagnosis not present

## 2024-01-01 DIAGNOSIS — R52 Pain, unspecified: Secondary | ICD-10-CM | POA: Diagnosis not present

## 2024-01-01 DIAGNOSIS — Z299 Encounter for prophylactic measures, unspecified: Secondary | ICD-10-CM | POA: Diagnosis not present

## 2024-01-03 DIAGNOSIS — M4120 Other idiopathic scoliosis, site unspecified: Secondary | ICD-10-CM | POA: Diagnosis not present

## 2024-01-08 DIAGNOSIS — M5459 Other low back pain: Secondary | ICD-10-CM | POA: Diagnosis not present

## 2024-01-13 DIAGNOSIS — M5459 Other low back pain: Secondary | ICD-10-CM | POA: Diagnosis not present

## 2024-01-21 DIAGNOSIS — M5459 Other low back pain: Secondary | ICD-10-CM | POA: Diagnosis not present

## 2024-01-23 DIAGNOSIS — M5459 Other low back pain: Secondary | ICD-10-CM | POA: Diagnosis not present

## 2024-01-27 DIAGNOSIS — M1712 Unilateral primary osteoarthritis, left knee: Secondary | ICD-10-CM | POA: Diagnosis not present

## 2024-01-27 DIAGNOSIS — M25562 Pain in left knee: Secondary | ICD-10-CM | POA: Diagnosis not present

## 2024-01-28 DIAGNOSIS — M5459 Other low back pain: Secondary | ICD-10-CM | POA: Diagnosis not present

## 2024-01-30 DIAGNOSIS — M5459 Other low back pain: Secondary | ICD-10-CM | POA: Diagnosis not present

## 2024-01-31 ENCOUNTER — Emergency Department (HOSPITAL_COMMUNITY)
Admission: EM | Admit: 2024-01-31 | Discharge: 2024-01-31 | Disposition: A | Discharge status: Home / Self Care | Attending: Emergency Medicine | Admitting: Emergency Medicine

## 2024-01-31 ENCOUNTER — Other Ambulatory Visit: Payer: Self-pay

## 2024-01-31 ENCOUNTER — Encounter (HOSPITAL_COMMUNITY): Payer: Self-pay | Admitting: Emergency Medicine

## 2024-01-31 ENCOUNTER — Emergency Department (HOSPITAL_COMMUNITY)

## 2024-01-31 DIAGNOSIS — W01198A Fall on same level from slipping, tripping and stumbling with subsequent striking against other object, initial encounter: Secondary | ICD-10-CM | POA: Insufficient documentation

## 2024-01-31 DIAGNOSIS — Z043 Encounter for examination and observation following other accident: Secondary | ICD-10-CM | POA: Diagnosis not present

## 2024-01-31 DIAGNOSIS — Y92007 Garden or yard of unspecified non-institutional (private) residence as the place of occurrence of the external cause: Secondary | ICD-10-CM | POA: Diagnosis not present

## 2024-01-31 DIAGNOSIS — S0990XA Unspecified injury of head, initial encounter: Secondary | ICD-10-CM | POA: Insufficient documentation

## 2024-01-31 DIAGNOSIS — S0232XA Fracture of orbital floor, left side, initial encounter for closed fracture: Secondary | ICD-10-CM | POA: Insufficient documentation

## 2024-01-31 DIAGNOSIS — Z7901 Long term (current) use of anticoagulants: Secondary | ICD-10-CM | POA: Insufficient documentation

## 2024-01-31 DIAGNOSIS — M5030 Other cervical disc degeneration, unspecified cervical region: Secondary | ICD-10-CM | POA: Diagnosis not present

## 2024-01-31 DIAGNOSIS — M47812 Spondylosis without myelopathy or radiculopathy, cervical region: Secondary | ICD-10-CM | POA: Diagnosis not present

## 2024-01-31 DIAGNOSIS — W19XXXA Unspecified fall, initial encounter: Secondary | ICD-10-CM

## 2024-01-31 DIAGNOSIS — S0510XA Contusion of eyeball and orbital tissues, unspecified eye, initial encounter: Secondary | ICD-10-CM | POA: Diagnosis not present

## 2024-01-31 DIAGNOSIS — R519 Headache, unspecified: Secondary | ICD-10-CM | POA: Diagnosis present

## 2024-01-31 MED ORDER — METHYLPREDNISOLONE SODIUM SUCC 125 MG IJ SOLR
125.0000 mg | Freq: Once | INTRAMUSCULAR | Status: AC
  Administered 2024-01-31: 125 mg via INTRAMUSCULAR
  Filled 2024-01-31: qty 2
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
## 2024-01-31 NOTE — ED Provider Notes (Signed)
 [facility] AT [facility] Provider Note   CSN: [id number] Arrival date & time: 01/31/24  1005     Patient presents with: Fall   Marissa [name] is a 78 y.o. female.   78 year old female who is on Eliquis  presents today for a fall that occurred in her yard.  She states she was pulling weeds in her flower bed and started pulling some limbs from a different area when she noticed a heard of yellow jackets swarming out of the.  She was working in.  She tried to get away but she was started a few times.  She fell to the ground striking her head.  She states she did have some epistaxis which resolved 30 minutes later.  Noticed some pain over her right temple.  Denies other injuries.  States that she had applied baking soda over the dorsal aspect of her left hand where she was stung which gave her some relief.  She also took 2 Benadryl 's before coming in according to her daughter.  Denies any worsening of the sting sites.  She has been stung a total of 3 times.  The history is provided by the patient. No language interpreter was used.       Prior to Admission medications   Medication Sig Start Date End Date Taking? Authorizing Provider  amLODipine  (NORVASC ) 10 MG tablet Take 10 mg by mouth daily.    [provider]  anastrozole  (ARIMIDEX ) 1 MG tablet Take 1 tablet (1 mg total) by mouth daily. 07/16/23   Rogers Hai, MD  apixaban  (ELIQUIS ) 5 MG TABS tablet Take 1 tablet (5 mg total) by mouth 2 (two) times daily. 07/30/23   Alvan Dorn FALCON, MD  calcium carbonate (TUMS - DOSED IN MG ELEMENTAL CALCIUM) 500 MG chewable tablet Chew 1 tablet by mouth as needed for indigestion or heartburn.    [provider]  Cholecalciferol (VITAMIN D ) 125 MCG (5000 UT) CAPS Take 5,000 Units by mouth once a week.    [provider]  Coenzyme Q10 200 MG capsule Take 200 mg by mouth daily.    [provider]  denosumab  (PROLIA ) 60 MG/ML SOSY  injection Inject 60 mg into the skin every 6 (six) months.    [provider]  diclofenac  sodium (VOLTAREN ) 1 % GEL Apply 2 g topically 4 (four) times daily as needed. 04/16/19   Long, Joshua G, MD  ezetimibe (ZETIA) 10 MG tablet Take 10 mg by mouth daily. 09/08/21   [provider]  Anselm Oil 500 MG CAPS Take 500 mg by mouth daily.    [provider]  losartan -hydrochlorothiazide  (HYZAAR ) 100-25 MG tablet Take 1 tablet by mouth daily. 03/27/23   [provider]  metoprolol  succinate (TOPROL  XL) 25 MG 24 hr tablet Take 1.5 tablets (37.5 mg total) by mouth daily. 10/12/21   Alvan Dorn FALCON, MD  MISC NATURAL PRODUCTS PO Take by mouth. Absorbine Jr as needed    [provider]  MISC NATURAL PRODUCTS PO Take by mouth. Hylands leg cramp pills as needed    [provider]  omeprazole (PRILOSEC) 20 MG capsule Take 20 mg by mouth daily.    [provider]    Allergies: Nitrostat  [nitroglycerin ]    Review of Systems  Constitutional:  Negative for chills and fever.  Gastrointestinal:  Negative for abdominal pain.  Skin:  Positive for wound.  Neurological:  Negative for headaches.  All other systems reviewed and are negative.  Updated Vital Signs BP (!) 159/56 (BP Location: Left Arm)   Pulse 70   Temp 98.1 F (36.7 C) (Oral)   Resp 16   Ht 5' 5 (1.651 m)   Wt 94.8 kg   BMI 34.78 kg/m   Physical Exam Vitals and nursing note reviewed.  Constitutional:      General: She is not in acute distress.    Appearance: Normal appearance. She is not ill-appearing.  HENT:     Head: Normocephalic and atraumatic.     Nose: Nose normal.  Eyes:     Extraocular Movements: Extraocular movements intact.     Conjunctiva/sclera: Conjunctivae normal.     Pupils: Pupils are equal, round, and reactive to light.  Cardiovascular:     Rate and Rhythm: Normal rate and regular rhythm.  Pulmonary:     Effort: Pulmonary effort is normal. No respiratory  distress.  Musculoskeletal:        General: No deformity.  Skin:    Findings: No rash.  Neurological:     Mental Status: She is alert.     Comments: Cranial nerves III through XII intact.  Good range of motion in bilateral upper and lower extremities.  Normal speech.     (all labs ordered are listed, but only abnormal results are displayed) Labs Reviewed - No data to display  EKG: None  Radiology: No results found.   Procedures   Medications Ordered in the ED - No data to display                                  Medical Decision Making Amount and/or Complexity of Data Reviewed Radiology: ordered.  Risk Prescription drug management.   Medical Decision Making / ED Course   This patient presents to the ED for concern of fall, this involves an extensive number of treatment options, and is a complaint that carries with it a high risk of complications and morbidity.  The differential diagnosis includes intracranial injury, concussion, fracture, contusion  MDM: 78 year old female presents with her daughter for concern of fall and head injury.  Patient is alone Eliquis .  She is compliant with this.  This occurred earlier today in her backyard.  Reports there are wounds in the ground which she might have hit.  No loss of consciousness.   CT head, CT C-spine without acute intracranial finding.  CT maxillofacial shows evidence of orbital floor for fracture with some hemorrhage into the left maxillary sinus. Good EOMs.  No evidence of entrapment.  Discussed with Dr. Arelia who recommends sinus precautions and follow-up in her clinic next week. Patient has a flight scheduled in 2 days.  Given his fracture ENT does not recommend patient travel because it would be very uncomfortable plus she also needs to follow-up in clinic for potential surgical planning.  She was given a dose of Solu-Medrol  in the emergency department for the sting.  No significant reaction.  No rash.  No  evidence of anaphylaxis. Discussed she can continue antihistamine for the next 3-5 days.    Additional history obtained: -Additional history obtained from daughter at bedside -External records from outside source obtained and reviewed including: Chart review including previous notes, labs, imaging, consultation notes   Lab Tests: -I ordered, reviewed, and interpreted labs.   The pertinent results include:   Labs Reviewed - No data to display    EKG  EKG Interpretation Date/Time:  Ventricular Rate:    PR Interval:    QRS Duration:    QT Interval:    QTC Calculation:   R Axis:      Text Interpretation:           Imaging Studies ordered: I ordered imaging studies including CT head, CT maxillofacial, CT C-spine I independently visualized and interpreted imaging. I agree with the radiologist interpretation   Medicines ordered and prescription drug management: No orders of the defined types were placed in this encounter.   -I have reviewed the patients home medicines and have made adjustments as needed  Reevaluation: After the interventions noted above, I reevaluated the patient and found that they have :improved  Co morbidities that complicate the patient evaluation  Past Medical History:  Diagnosis Date   Anxiety    Atrial fibrillation (HCC)    Cancer (HCC) 05/2017   right breast cancer   Family history of breast cancer    Family history of ovarian cancer    Family history of stomach cancer    GERD (gastroesophageal reflux disease)    High cholesterol    History of radiation therapy 07/03/17- 07/31/2017   Right Breast/ 40/05 Gy in 15 fractions, Right Breast boost/ 10 Gy in 5 fractions.    Hypertension    Personal history of radiation therapy 2018/2019      Dispostion: Discharged in stable condition.  Return precaution discussed.  Patient voices understanding and is in agreement with plan.   Final diagnoses:  Fall, initial encounter  Injury of  head, initial encounter  Closed fracture of left orbital floor, initial encounter [facility])    ED Discharge Orders     None          Hildegard Loge, PA-C 01/31/24 1441    Elnor Savant A, DO 02/04/24 1330

## 2024-01-31 NOTE — Discharge Instructions (Addendum)
 CT scan of the head and neck did not show any concerning findings however CT scan of the face did not show any broken bone.  You have a orbital floor fracture.  I have spoken to Dr. Arelia and she recommends following with her office next week.  I have listed her phone number above.  You can call to schedule this appointment today.  You received a shot of Solu-Medrol .  No additional steroids were required given you have a very limited reaction from the yellow jacket stings.  Keep taking the antihistamine for the next 3-5 days.  You can take Benadryl  or switch to Allegra or Zyrtec which may be nondrowsy.  Please adhere to the sinus precautions below until you follow-up with ENT.  It is not recommended for you to fly 1 because it may be really uncomfortable due to the pressure change, but also so you can follow-up with Dr. Arelia and determine if he needs surgery.  Sinus precautions. Avoid pressure changes:   Do not blow your nose! If you have a runny nose, wipe your nose gently.  Try to avoid sneezing. If you do sneeze, sneeze with your mouth open to  avoid pressure build up.   Do not use a straw.  Avoid bending over -- try to keep your head above the level of your  heart.   Sleep with your head slightly raised.  Do not strain by pushing or lifting heavy objects.   Avoid the following activities: o Lexicographer o Playing a wind instrument o Blowing up balloons, or other things that cause pressure changes in  your mouth

## 2024-02-03 DIAGNOSIS — S0232XA Fracture of orbital floor, left side, initial encounter for closed fracture: Secondary | ICD-10-CM | POA: Diagnosis not present

## 2024-02-10 DIAGNOSIS — M5459 Other low back pain: Secondary | ICD-10-CM | POA: Diagnosis not present

## 2024-02-12 DIAGNOSIS — M5459 Other low back pain: Secondary | ICD-10-CM | POA: Diagnosis not present

## 2024-02-13 DIAGNOSIS — M4126 Other idiopathic scoliosis, lumbar region: Secondary | ICD-10-CM | POA: Diagnosis not present

## 2024-02-19 DIAGNOSIS — M5459 Other low back pain: Secondary | ICD-10-CM | POA: Diagnosis not present

## 2024-02-20 DIAGNOSIS — M5459 Other low back pain: Secondary | ICD-10-CM | POA: Diagnosis not present

## 2024-02-26 DIAGNOSIS — M5459 Other low back pain: Secondary | ICD-10-CM | POA: Diagnosis not present

## 2024-02-28 DIAGNOSIS — C50911 Malignant neoplasm of unspecified site of right female breast: Secondary | ICD-10-CM | POA: Diagnosis not present

## 2024-02-28 DIAGNOSIS — I480 Paroxysmal atrial fibrillation: Secondary | ICD-10-CM | POA: Diagnosis not present

## 2024-02-28 DIAGNOSIS — M5459 Other low back pain: Secondary | ICD-10-CM | POA: Diagnosis not present

## 2024-02-28 DIAGNOSIS — I10 Essential (primary) hypertension: Secondary | ICD-10-CM | POA: Diagnosis not present

## 2024-02-28 DIAGNOSIS — Z299 Encounter for prophylactic measures, unspecified: Secondary | ICD-10-CM | POA: Diagnosis not present

## 2024-03-03 DIAGNOSIS — M5459 Other low back pain: Secondary | ICD-10-CM | POA: Diagnosis not present

## 2024-03-06 DIAGNOSIS — M5459 Other low back pain: Secondary | ICD-10-CM | POA: Diagnosis not present

## 2024-03-10 DIAGNOSIS — M5459 Other low back pain: Secondary | ICD-10-CM | POA: Diagnosis not present

## 2024-03-12 DIAGNOSIS — M5459 Other low back pain: Secondary | ICD-10-CM | POA: Diagnosis not present

## 2024-03-17 DIAGNOSIS — M5459 Other low back pain: Secondary | ICD-10-CM | POA: Diagnosis not present

## 2024-03-19 DIAGNOSIS — M5459 Other low back pain: Secondary | ICD-10-CM | POA: Diagnosis not present

## 2024-03-25 DIAGNOSIS — M5459 Other low back pain: Secondary | ICD-10-CM | POA: Diagnosis not present

## 2024-03-30 ENCOUNTER — Ambulatory Visit

## 2024-04-01 ENCOUNTER — Ambulatory Visit (HOSPITAL_COMMUNITY)
Admission: RE | Admit: 2024-04-01 | Discharge: 2024-04-01 | Disposition: A | Discharge status: Home / Self Care | Source: Ambulatory Visit | Attending: Hematology | Admitting: Hematology

## 2024-04-01 ENCOUNTER — Ambulatory Visit

## 2024-04-01 ENCOUNTER — Inpatient Hospital Stay: Attending: Hematology

## 2024-04-01 VITALS — BP 140/52 | HR 52 | Temp 98.6°F | Resp 18

## 2024-04-01 DIAGNOSIS — M810 Age-related osteoporosis without current pathological fracture: Secondary | ICD-10-CM | POA: Diagnosis not present

## 2024-04-01 DIAGNOSIS — Z780 Asymptomatic menopausal state: Secondary | ICD-10-CM | POA: Diagnosis not present

## 2024-04-01 DIAGNOSIS — M8580 Other specified disorders of bone density and structure, unspecified site: Secondary | ICD-10-CM | POA: Insufficient documentation

## 2024-04-01 DIAGNOSIS — M5459 Other low back pain: Secondary | ICD-10-CM | POA: Diagnosis not present

## 2024-04-01 DIAGNOSIS — C50411 Malignant neoplasm of upper-outer quadrant of right female breast: Secondary | ICD-10-CM

## 2024-04-01 DIAGNOSIS — M8589 Other specified disorders of bone density and structure, multiple sites: Secondary | ICD-10-CM | POA: Diagnosis not present

## 2024-04-01 MED ORDER — DENOSUMAB 60 MG/ML ~~LOC~~ SOSY
60.0000 mg | PREFILLED_SYRINGE | Freq: Once | SUBCUTANEOUS | Status: AC
  Administered 2024-04-01: 60 mg via SUBCUTANEOUS
  Filled 2024-04-01: qty 1

## 2024-04-01 NOTE — Progress Notes (Signed)
 [facility] 03/23/24  Calcium = 10.8  Niels Molt, PharmD

## 2024-04-01 NOTE — Patient Instructions (Signed)
 Denosumab Injection (Osteoporosis) What is this medication? DENOSUMAB (den oh SUE mab) prevents and treats osteoporosis. It works by Interior and spatial designer stronger and less likely to break (fracture). It is a monoclonal antibody. This medicine may be used for other purposes; ask your health care provider or pharmacist if you have questions. COMMON BRAND NAME(S): Prolia What should I tell my care team before I take this medication? They need to know if you have any of these conditions: Dental or gum disease Had thyroid or parathyroid (glands located in neck) surgery Having dental surgery or a tooth pulled Kidney disease Low levels of calcium in the blood On dialysis Poor nutrition Thyroid disease Trouble absorbing nutrients from your food An unusual or allergic reaction to denosumab, other medications, foods, dyes, or preservatives Pregnant or trying to get pregnant Breastfeeding How should I use this medication? This medication is injected under the skin. It is given by your care team in a hospital or clinic setting. A special MedGuide will be given to you before each treatment. Be sure to read this information carefully each time. Talk to your care team about the use of this medication in children. Special care may be needed. Overdosage: If you think you have taken too much of this medicine contact a poison control center or emergency room at once. NOTE: This medicine is only for you. Do not share this medicine with others. What if I miss a dose? Keep appointments for follow-up doses. It is important not to miss your dose. Call your care team if you are unable to keep an appointment. What may interact with this medication? Do not take this medication with any of the following: Other medications that contain denosumab This medication may also interact with the following: Medications that lower your chance of fighting infection Steroid medications, such as prednisone or cortisone This  list may not describe all possible interactions. Give your health care provider a list of all the medicines, herbs, non-prescription drugs, or dietary supplements you use. Also tell them if you smoke, drink alcohol, or use illegal drugs. Some items may interact with your medicine. What should I watch for while using this medication? Your condition will be monitored carefully while you are receiving this medication. You may need blood work done while taking this medication. This medication may increase your risk of getting an infection. Call your care team for advice if you get a fever, chills, sore throat, or other symptoms of a cold or flu. Do not treat yourself. Try to avoid being around people who are sick. Tell your dentist and dental surgeon that you are taking this medication. You should not have major dental surgery while on this medication. See your dentist to have a dental exam and fix any dental problems before starting this medication. Take good care of your teeth while on this medication. Make sure you see your dentist for regular follow-up appointments. This medication may cause low levels of calcium in your body. The risk of severe side effects is increased in people with kidney disease. Your care team may prescribe calcium and vitamin D to help prevent low calcium levels while you take this medication. It is important to take calcium and vitamin D as directed by your care team. Talk to your care team if you may be pregnant. Serious birth defects may occur if you take this medication during pregnancy and for 5 months after the last dose. You will need a negative pregnancy test before starting this medication. Contraception  is recommended while taking this medication and for 5 months after the last dose. Your care team can help you find the option that works for you. Talk to your care team before breastfeeding. Changes to your treatment plan may be needed. What side effects may I notice from  receiving this medication? Side effects that you should report to your care team as soon as possible: Allergic reactions--skin rash, itching, hives, swelling of the face, lips, tongue, or throat Infection--fever, chills, cough, sore throat, wounds that don't heal, pain or trouble when passing urine, general feeling of discomfort or being unwell Low calcium level--muscle pain or cramps, confusion, tingling, or numbness in the hands or feet Osteonecrosis of the jaw--pain, swelling, or redness in the mouth, numbness of the jaw, poor healing after dental work, unusual discharge from the mouth, visible bones in the mouth Severe bone, joint, or muscle pain Skin infection--skin redness, swelling, warmth, or pain Side effects that usually do not require medical attention (report these to your care team if they continue or are bothersome): Back pain Headache Joint pain Muscle pain Pain in the hands, arms, legs, or feet Runny or stuffy nose Sore throat This list may not describe all possible side effects. Call your doctor for medical advice about side effects. You may report side effects to FDA at 1-800-FDA-1088. Where should I keep my medication? This medication is given in a hospital or clinic. It will not be stored at home. NOTE: This sheet is a summary. It may not cover all possible information. If you have questions about this medicine, talk to your doctor, pharmacist, or health care provider.  2024 Elsevier/Gold Standard (2022-07-31 00:00:00)

## 2024-04-01 NOTE — Progress Notes (Signed)
 Patient's labs completed by [facility] on 03/23/24. Labs reviewed. Per pt she is still taking Vit D as prescribed. Patient tolerated Prolia  injection with no complaints voiced.  Site clean and dry with no bruising or swelling noted at site.  See MAR for details.  Band aid applied.  Patient stable during and after injection.  Vss with discharge and left in satisfactory condition with no s/s of distress noted. All follow ups as scheduled.   Marissa [name]

## 2024-04-03 ENCOUNTER — Ambulatory Visit: Payer: Self-pay | Admitting: Oncology

## 2024-04-03 NOTE — Progress Notes (Signed)
 Hey Tomi, can you let this patient know that her bone density scan showed improvement from 2023.  This is great news.  Calcium and vitamin D .  I am not sure if she is on a bisphosphonate but if she is we will continue.   Delon Hope, NP 04/03/2024 8:59 AM

## 2024-04-06 NOTE — Progress Notes (Signed)
 Spoke to patient and results reviewed, along with recommendations.  Verbalized understanding.

## 2024-04-27 DIAGNOSIS — D3131 Benign neoplasm of right choroid: Secondary | ICD-10-CM | POA: Diagnosis not present

## 2024-04-29 DIAGNOSIS — Z23 Encounter for immunization: Secondary | ICD-10-CM | POA: Diagnosis not present

## 2024-04-29 DIAGNOSIS — I10 Essential (primary) hypertension: Secondary | ICD-10-CM | POA: Diagnosis not present

## 2024-04-29 DIAGNOSIS — Z299 Encounter for prophylactic measures, unspecified: Secondary | ICD-10-CM | POA: Diagnosis not present

## 2024-04-29 DIAGNOSIS — I480 Paroxysmal atrial fibrillation: Secondary | ICD-10-CM | POA: Diagnosis not present

## 2024-05-13 ENCOUNTER — Other Ambulatory Visit: Payer: Self-pay | Admitting: *Deleted

## 2024-05-13 DIAGNOSIS — C50411 Malignant neoplasm of upper-outer quadrant of right female breast: Secondary | ICD-10-CM

## 2024-05-13 MED ORDER — ANASTROZOLE 1 MG PO TABS: 1.0000 mg | ORAL_TABLET | Freq: Every day | ORAL | 3 refills | Status: AC

## 2024-05-13 NOTE — Telephone Encounter (Signed)
 Patient tolerating Anastrozole  without complications and is to continue therapy.

## 2024-06-24 ENCOUNTER — Inpatient Hospital Stay: Admission: RE | Admit: 2024-06-24 | Discharge: 2024-06-24 | Attending: Hematology | Admitting: Hematology

## 2024-06-24 DIAGNOSIS — Z1231 Encounter for screening mammogram for malignant neoplasm of breast: Secondary | ICD-10-CM

## 2024-06-24 DIAGNOSIS — C50411 Malignant neoplasm of upper-outer quadrant of right female breast: Secondary | ICD-10-CM

## 2024-07-06 ENCOUNTER — Encounter: Payer: Self-pay | Admitting: *Deleted

## 2024-09-30 ENCOUNTER — Ambulatory Visit

## 2024-12-09 ENCOUNTER — Inpatient Hospital Stay: Admitting: Oncology

## 2024-12-16 ENCOUNTER — Inpatient Hospital Stay: Admitting: Oncology

## 2024-12-16 ENCOUNTER — Ambulatory Visit: Admitting: Oncology
